# Patient Record
Sex: Male | Born: 1990
Health system: Southern US, Community
[De-identification: ages and names within clinical notes are randomized; demographics above are authoritative.]

## PROBLEM LIST (undated history)

## (undated) DIAGNOSIS — I10 Essential (primary) hypertension: Secondary | ICD-10-CM

## (undated) DIAGNOSIS — F410 Panic disorder [episodic paroxysmal anxiety] without agoraphobia: Secondary | ICD-10-CM

## (undated) DIAGNOSIS — J45909 Unspecified asthma, uncomplicated: Secondary | ICD-10-CM

## (undated) DIAGNOSIS — J42 Unspecified chronic bronchitis: Secondary | ICD-10-CM

## (undated) DIAGNOSIS — F419 Anxiety disorder, unspecified: Secondary | ICD-10-CM

## (undated) HISTORY — PX: TONSILLECTOMY: SUR1361

---
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
## 2011-02-12 LAB — LIPID PANEL
Cholesterol: 158 mg/dL (ref 0–169)
HDL: 36 mg/dL (ref >34)
Total CHOL/HDL Ratio: 4.4 RATIO

## 2011-02-12 LAB — GC/CHLAMYDIA PROBE AMP, URINE
Chlamydia, Swab/Urine, PCR: NEGATIVE
GC Probe Amp, Urine: NEGATIVE

## 2011-02-12 LAB — RPR: RPR Ser Ql: NONREACTIVE

## 2011-03-19 NOTE — H&P (Signed)
NAMEDENZIL, MCEACHRON NO.:  [id number]   MEDICAL RECORD NO.:  [id number]          PATIENT TYPE:  INP   LOCATION:  0200                          FACILITY:  BH   PHYSICIAN:  Lalla Brothers, MDDATE OF BIRTH:  Apr 24, 1991   DATE OF ADMISSION:  03/15/2009  DATE OF DISCHARGE:                       PSYCHIATRIC ADMISSION ASSESSMENT   IDENTIFICATION:  A 84-45/20-year-old male, eleventh grade student at  [employer], is admitted emergently involuntarily on an  [facility] petition for commitment upon transfer from [facility] Emergency Department for inpatient stabilization  and psychiatric treatment of psychotic depression with suicide risk and  homicide ideation also associated with dangerous disruptive behavior and  unsuccessful outpatient treatment including psychotherapy and 1-1/2  months of low-dose Prozac.  The patient has suicide plan to overdose,  hang, or shoot himself reporting that he has access to a gun.  He had  overdosed with Tylenol 5 months ago as a suicide attempt.  He feels  stress from his underachievement at school and sleeping in class and  suggests himself that school is the main reason to kill himself.  He is  sleeping only 4 hours nightly.   HISTORY OF PRESENT ILLNESS:  The patient is referred as having several  years of episodic suicide ideation with recurrent depression.  The  patient tends to store up strong negative emotions stating that he can  cope temporarily by listening to music or reading an article.  However,  he has recurrent saturation with negative thoughts and emotions  resulting in anger outbursts and acting upon negative thoughts such as  by the suicide attempt.  The patient implies that auditory  hallucinations have been going on for a long time but mother suggests  they have been present for 3 weeks.  He describes anxiety attacks  several times weekly.  He reports that voices are  telling him to kill  self or others.  The patient knows a maternal aunt has bipolar disorder  but does not describe any manic diathesis himself.  Rather the patient  appears depressed and anxious.  He seems to describe some generalized  anxiety in his episodic limited symptom attacks and seemingly pervasive  discomfort with attempting to but not meeting responsibilities.  The  patient does not maintain significant eye contact and does not openly  discuss his symptoms.  He does have a school counselor Ms. Tryola.  He  is seeing Gweneth Fritter for outpatient therapy at [facility]  at [facility].  The patient is on Prozac 10 mg every morning for the  last month and a half from Dr. Loma Sender, though apparently in  the past he has seen Dr. Sherrie Mustache 4 months ago for general medical care.  The patient finds no benefit from Prozac, though he finds no side  effects either.  However, despite therapy and medication, he is not  applying himself any further to responsibilities especially academics.  Mother seems to minimize the patient's symptoms while the patient seems  to overstate these at least in comparison to mother.  The patient  considers his voices currently  dangerous and himself unable to function.  He uses no alcohol or illicit drugs.  He does use zip or chewing tobacco  reportedly all day long.  Parents do not answer the phone calls for  collateral information.  The patient presents alone by law enforcement,  though mother apparently was at the emergency department and left after  assessment was concluded.   PAST MEDICAL HISTORY:  The patient is under the primary care of Dr.  Sherrie Mustache, last seen 4 months ago, though more recently has seen Dr.  Loma Sender for Prozac 10 mg daily.  In the emergency department,  random glucose was 115 while CO2 was slightly elevated at 32.  He had a  trace of bacteria in the urinalysis, though with amorphous phosphate  crystals present,  which may well obscure that microscopic assessment.  He otherwise denies seizures or syncope.  He has no heart murmur or  arrhythmia.  He has no organic central nervous system trauma.  He has no  purging.  He is allergic to PENICILLIN manifested by rash.  He has  sensitivity to CODEINE manifested by a tachycardia.   REVIEW OF SYSTEMS:  The patient offers paucity of spontaneous verbal  elaboration and questions.  He is avoidant in his social and learning  based posture.  He has no known exposure to communicable disease or  toxins.  He has no rash, jaundice, or purpura.  There is no chest pain,  palpitations, or presyncope.  There is no abdominal pain, nausea,  vomiting, or diarrhea.  There is no dysuria arthralgia.   IMMUNIZATIONS:  Up to date.   FAMILY HISTORY:  The patient resides with parents who are not available  to obtain additional family history at this time.  Maternal aunt has  bipolar disorder by history.  An uncle in [place] has been on Prozac but then  also developed hallucinations such that mother wonders if Prozac causes  hallucinations.  Mother takes Buspar for anxiety and did require  antidepressants in the past with benefit.   SOCIAL AND DEVELOPMENTAL HISTORY:  The patient is an eleventh grade  student at [employer].  The patient reports his school  is stressful.  He is currently making B's and F's according to the  patient with sleeping in class.  He has three F's currently and  considers himself failing.   He denies current legal charges.  He does not answer questions about  sexuality.   ASSETS:  The patient reads in order to cope, though in doing so he does  not learn.   MENTAL STATUS EXAM:  Height is 165 cm and weight is 54 kg.  Blood  pressure is 133/86 with heart rate of 68 sitting and 130/87 with heart  rate of 59 standing.  He is right-handed.  The patient is alert and  oriented with cranial nerves intact.  Muscle strength and tone are   normal.  There are no pathologic reflexes or soft neurologic findings.  There are no abnormal involuntary movements.  Gait and gaze are intact.  The patient is avoidant but can become more carefully engaging.  He  stores up conflicts with increased anxiety and despair.  He stores up  strong negative emotions and then becomes either angry or anxious in the  course of attempting to displace and minimize depression.  The patient  appears to respond to internal stimuli in his gaze and posture, though  he does not acknowledge such.  He has a florid  and marginal interest.  He does have boredom as well as generalized anxiety but does not  manifest definite panic at this time..  The patient has increased  anxiety and despair.  He has severe dysphoria and moderate anxiety of a  generalize nature.  He is restless with fragile but definite preserved  interest.  He has no mania or psychosis at this time.  He has no  misperceptions or false perceptions.  The patient has episodic suicidal  ideation.  He reports no affect or side effects from Prozac.  He reports  suicide ideation more than homicide ideation and planning to use a gun  foremost after which to hang or overdose himself.   IMPRESSION:  Axis I:  1.  Major depression, recurrent, severe, with  psychotic features.  2.  Generalized anxiety disorder.  3.  Oppositional  defiant disorder (provisional diagnosis).  4.  Parent-child problem.  5.  Academic problem.  6.  Other interpersonal problem.  7.  Family history  of bipolar disorder.  Axis II:  Diagnosis deferred.  Axis III:  1.  Allergy to PENICILLIN and sensitivity to CODEINE.  Axis IV:  Stressors:  Phase of life, severe, acute and chronic; school,  severe, acute and chronic; family, moderate, acute and chronic.  Axis V:  Global assessment of functioning on admission 33 with highest  in the last year 65.   PLAN:  The patient is admitted for inpatient adolescent psychiatric in   multidisciplinary and multimodal behavioral health treatment in a team-  based programmatic locked psychiatric unit.  We will increase Prozac to  10 mg in the morning and 20 mg at bedtime or consider change to Celexa.  We will consider addition of Risperdal according to metabolic screening  and objective assessment of symptoms.  Nicoderm 14 mg patch is made  available for withdrawal.  Cognitive behavioral therapy, anger  management, interpersonal therapy, desensitization, reintegration,  social and communication skill training, problem-solving and coping  skill training, family therapy, habit reversal, individuation-  separation, and identity consolidation therapies can be undertaken.  Estimated length of stay is 7 days  with target symptom for discharge being stabilization of suicide risk  and mood, stabilization of homicide risk and command auditory  hallucinations, stabilization of dangerous disruptive behavior and  associated academic and social fixation, and generalization of the  capacity for safe and effective participation in outpatient treatment.      Lalla Brothers, MD  Electronically Signed     GEJ/MEDQ  D:  03/15/2009  T:  03/16/2009  Job:  [id number]

## 2011-03-22 NOTE — Discharge Summary (Signed)
[name], WAGER NO.:  [id number]   MEDICAL RECORD NO.:  [id number]          PATIENT TYPE:  INP   LOCATION:  0200                          FACILITY:  BH   PHYSICIAN:  Lalla Brothers, MDDATE OF BIRTH:  06-30-1991   DATE OF ADMISSION:  03/15/2009  DATE OF DISCHARGE:  03/21/2009                               DISCHARGE SUMMARY   IDENTIFICATION:  This 8-60/20-year-old male, 11th grade student at  [employer] was admitted emergently involuntarily on an  [facility] petition for commitment upon transfer from [facility] emergency department for inpatient treatment of psychotic  depression with suicide and homicide risk.  The patient had suicide plan  to overdose, hang or shoot himself, reporting access to a gun.  He had  overdosed with Tylenol 5 months ago as suicide attempt and his symptoms  have not stabilized with a month and half of Prozac 10 mg daily for  ongoing psychotherapy.  For full details, please see the typed admission  assessment.   SYNOPSIS OF PRESENT ILLNESS:  The patient resides with both parents and  a 68 year old sister lives next door.  The patient had been going  downhill since paternal grandmother died in 2009/03/03.  Parents  apparently have marital conflicts with father having high expressed  emotion and mother having history of anxiety and depression.  The  patient has much more access to mother for support.  He is failing most  of his classes and hates school, doing best in [place] and electives.  The patient and father are discussing transfer to a GED program.  The  patient has not been able to achieve his driver's license.  Employment  has been discussed but the patient has not started yet.  The patient  would like to work in a sawmill.  He indicates bisexuality but does not  disclose such to parents.  Maternal great-aunt had bipolar disorder as  did maternal great-uncle who was schizoaffective.   Two paternal uncles  have mental retardation and paternal uncle has substance abuse with  alcohol.  Mother is concerned that an uncle in the [place] system has been on  Prozac and got worse, developing auditory hallucinations though mother  is not certain  Prozac was the cause.  Mother is taking BuSpar for her  anxiety, requiring antidepressant in the past, having benefit but not  having sustained use of the medicine.  The patient has used alcohol  weekly and also uses cannabis, mushrooms, morphine, OxyContin, and  Vicodin.  He uses dip tobacco more than cigarettes.   INITIAL MENTAL STATUS EXAMINATION:  He is right-handed with intact  neurological exam.  He stores up strong negative emotion, becoming  avoidant of conflicts and opportunity for problem-solving.  He reports  auditory hallucinations and appears to displace his gaze and posture  according to such.  He has severe dysphoria and moderate anxiety.  He  has no mania.  He did  plan to hang, overdose or shoot himself.   TOTAL LABORATORY FINDINGS:  In the emergency department, CBC was normal  with white count 7500,  hemoglobin 15.6, MCV of 88, MCH of 29.7 and  platelet count 273,000.  Comprehensive metabolic panel was normal with  sodium 140, potassium 3.9, random glucose 115, creatinine 1.1, calcium  9.8, albumin 4.8, AST 13 and ALT 30.  TSH was normal at 1.2 with  reference range 0.5-5.1.  Blood alcohol and urine drug screen were  negative.  Urinalysis was normal with specific gravity of 1.015 and pH 9  with a trace of bacteria and amorphous phosphate crystals.  [facility], 10-hour fasting lipid profile was normal except LDL  cholesterol borderline elevated at 111 with upper limit of normal 109.  HDL cholesterol was normal at 36, VLDL 9 and triglyceride 45, for total  cholesterol 158.  Hemoglobin A1c was normal at 5.1% with reference range  4.6-6.1.  RPR was nonreactive and urine probe for gonorrhea and  chlamydia by DNA  amplification were both negative.   HOSPITAL COURSE AND TREATMENT:  General medical exam by Jorje Guild, PA-C  noted allergy to CODEINE and PENICILLIN.  He had tonsillectomy at age  [age] years.  The patient has eyeglasses.  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  His BMI is 19.8 and he had  cerumen accumulation in both external ear canals.  He is bisexually  active.  The patient's hearing acuity is somewhat more prominent on the  left than the right, though he did have a cerumen accumulation.  The  patient was afebrile throughout hospital stay with maximum temperature  98.2.  His height was 165 cm and his weight was 54 kg in the emergency  department, 56 kg on discharge.   VITAL SIGNS:  Initial supine blood pressure was 115/67 with heart rate  of 59 and standing blood pressure 132/80 with heart rate of 59 before  medications were started.  At the time of discharge on discharge  medication dosing, supine blood pressure was 116/68 with heart rate of  70 and standing blood pressure 118/69 with heart rate of 114.   The patient was started on increased dose of fluoxetine at 10 mg in the  morning and 20 mg at bedtime, thereby an increase of 20 mg at bedtime.  Risperdal was started the following day at 0.25 mg in the morning and  0.5 mg at bedtime.  The Risperdal blood level may be reduced by Prozac  accelerating excretion.  Hopefully the Risperdal was titrated up to 2 mg  nightly dose and the Prozac to 20 mg nightly dose as a single dose of  each every evening.  The patient required a Nicoderm patch up to 21 mg  as needed for withdrawal.  He tolerated medications well with no  extrapyramidal, hypomanic, over-activation or suicide related side  effects.  The patient had resolution of auditory hallucinations by 2/3  of the way through the hospital stay.  He then became capable of working  in various psychotherapies much better and wanted discharge at that  time.   He completed treatment appropriately with final family therapy  session with both parents.  Noted parents were pleased with the  patient's improvement in both his depression and auditory  hallucinations.  The patient reported to parents that they were too  rigid in their ways for him to disclose to them his mental health  difficulties.  Father carefully offered to be available for any problems  the patient has.  They concluded to contact [employer] about a  diploma program  with the patient and parents concluding he could not  return to [facility], despite being stabilized in the program and on  medications.  The patient did not process his bisexuality with parents.  He required no seclusion or restraint during hospital stay.  They were  educated on warnings and side effects from medications.  Hopefully,  Risperdal can be time limited over several months and Prozac may need to  be longer term.   FINAL DIAGNOSES:  AXIS I:  1. Major depression, recurrent, severe with psychotic features.  2. Generalized anxiety disorder.  3. Other interpersonal problem.  4. Parent child problems.  5. Other specified family circumstances.  AXIS II: Diagnosis deferred.  AXIS III:  1. Allergy to penicillin.  2. Sensitivity to codeine.  3. Dip tobacco and cigarettes.  4. Borderline elevation of LDL cholesterol of 113 mg/dL.  5. Facial acne.  6. 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  7. Eyeglasses  8. Cerumen accumulation both external ear canals with more hearing      acuity in the left than right ear  AXIS IV: Stressors:  Phase of life, severe, acute and chronic; school,  severe, acute and chronic; family, moderate acute and chronic.  AXIS V: GAF on admission was 33 with highest in last year 65 and  discharge GAF was 52.   PLAN:  The patient was discharged to parents in improved condition, free  of suicidal ideation.  He follows a regular diet, has no restrictions on  physical activity except to  remain sober.  He requires no wound care or  pain management.  Crisis and safety plans are outlined if needed.   DISCHARGE MEDICATIONS:  1. He is discharged on fluoxetine 20 mg capsule every bedtime,      quantity #30 prescribed.  2. He is also discharged on risperidone 2 mg tablet every bedtime,      quantity #30 prescribed.   He will have medication management appointment with Dr. Vear Clock April 04, 2009 at 1500 at [phone].  He will see Burley Saver at [facility]  Mar 23, 2009 at 1700 at [phone] for therapy.      Lalla Brothers, MD  Electronically Signed     GEJ/MEDQ  D:  03/23/2009  T:  03/24/2009  Job:  [id number]   cc:   Weston Anna  Fax: [phone]

## 2013-08-21 ENCOUNTER — Emergency Department: Payer: Self-pay | Admitting: Emergency Medicine

## 2013-08-22 LAB — MONONUCLEOSIS SCREEN: Mono Test: NEGATIVE

## 2013-08-22 LAB — TSH: Thyroid Stimulating Horm: 3.13 u[IU]/mL

## 2013-08-25 ENCOUNTER — Emergency Department: Payer: Self-pay | Admitting: Emergency Medicine

## 2013-08-25 LAB — BETA STREP CULTURE(ARMC)

## 2013-12-15 ENCOUNTER — Emergency Department: Payer: Self-pay | Admitting: Internal Medicine

## 2013-12-15 LAB — URINALYSIS, COMPLETE
BACTERIA: NONE SEEN
BLOOD: NEGATIVE
Bilirubin,UR: NEGATIVE
GLUCOSE, UR: NEGATIVE mg/dL (ref 0–75)
LEUKOCYTE ESTERASE: NEGATIVE
NITRITE: NEGATIVE
PH: 7 (ref 4.5–8.0)
PROTEIN: NEGATIVE
Specific Gravity: 1.006 (ref 1.003–1.030)
Squamous Epithelial: NONE SEEN
WBC UR: NONE SEEN /HPF (ref 0–5)

## 2013-12-16 ENCOUNTER — Emergency Department: Payer: Self-pay | Admitting: Emergency Medicine

## 2013-12-17 ENCOUNTER — Emergency Department: Payer: Self-pay | Admitting: Emergency Medicine

## 2013-12-17 LAB — URINALYSIS, COMPLETE
BACTERIA: NONE SEEN
Bilirubin,UR: NEGATIVE
Blood: NEGATIVE
Glucose,UR: NEGATIVE mg/dL (ref 0–75)
KETONE: NEGATIVE
Leukocyte Esterase: NEGATIVE
Nitrite: NEGATIVE
PH: 6 (ref 4.5–8.0)
PROTEIN: NEGATIVE
RBC,UR: NONE SEEN /HPF (ref 0–5)
SPECIFIC GRAVITY: 1.006 (ref 1.003–1.030)
Squamous Epithelial: NONE SEEN
WBC UR: NONE SEEN /HPF (ref 0–5)

## 2013-12-19 ENCOUNTER — Emergency Department (HOSPITAL_COMMUNITY)
Admission: EM | Admit: 2013-12-19 | Discharge: 2013-12-19 | Disposition: A | Discharge status: Home / Self Care | Payer: BC Managed Care – PPO | Attending: Emergency Medicine | Admitting: Emergency Medicine

## 2013-12-19 ENCOUNTER — Encounter (HOSPITAL_COMMUNITY): Payer: Self-pay | Admitting: Emergency Medicine

## 2013-12-19 DIAGNOSIS — Z880 Allergy status to penicillin: Secondary | ICD-10-CM | POA: Insufficient documentation

## 2013-12-19 DIAGNOSIS — R509 Fever, unspecified: Secondary | ICD-10-CM | POA: Insufficient documentation

## 2013-12-19 DIAGNOSIS — N489 Disorder of penis, unspecified: Secondary | ICD-10-CM | POA: Insufficient documentation

## 2013-12-19 DIAGNOSIS — F17200 Nicotine dependence, unspecified, uncomplicated: Secondary | ICD-10-CM | POA: Insufficient documentation

## 2013-12-19 DIAGNOSIS — B009 Herpesviral infection, unspecified: Secondary | ICD-10-CM | POA: Insufficient documentation

## 2013-12-19 MED ORDER — VALACYCLOVIR HCL 1 G PO TABS: 1000.0000 mg | ORAL_TABLET | Freq: Two times a day (BID) | ORAL | Status: AC

## 2013-12-19 NOTE — Discharge Instructions (Signed)
Please read and follow all provided instructions.  Your diagnoses today include:  1. Herpes simplex     Tests performed today include:  Vital signs. See below for your results today.   Medications:   Valtrex - medication to suppress herpes rash  Home care instructions:  Read educational materials contained in this packet and follow any instructions provided.   Follow-up instructions: You should [facility] to be tested for GC/chlamydia, HIV, syphilis, and hepatitis -- all of which can be transmitted by sexual contact. We do not routinely screen for these in the Emergency Department.  STD Testing:  [facility] [place] [facility] [place] [place]  [address], [place], phone [phone] or [phone]    Monday - Friday, call for an appointment  [facility] [place] [facility]  [address], [place], phone [phone] or [phone]   Monday - Friday, call for an appointment   If you do not have a primary care doctor -- see below for referral information.   Return instructions:   Please return to the Emergency Department if you experience worsening symptoms.   Please return if you have any other emergent concerns.  Additional Information:  Your vital signs today were: BP 123/70   Pulse 91   Resp 18   Ht 5\' 6"  (1.676 m)   Wt 130 lb (58.968 kg)   BMI 20.99 kg/m2   SpO2 99% If your blood pressure (BP) was elevated above 135/85 this visit, please have this repeated by your doctor within one month. --------------

## 2013-12-19 NOTE — ED Notes (Signed)
Pt c/o pimples to genital area for 1-2 weeks. Pt reports pimples spreading noted yesterday. Pt reports yellowish clear drainage coming from pimples.

## 2013-12-19 NOTE — ED Provider Notes (Signed)
CSN: [id number]     Arrival date & time 12/19/13  1556 History  This chart was scribed for non-physician practitioner Rhea BleacherJosh Jazzlin Clements, PA working with Layla MawKristen N Ward, DO by Elveria Risingimelie Horne, ED Scribe. This patient was seen in room TR10C/TR10C and the patient's care was started at 5:05 PM.   Chief Complaint  Patient presents with  . Rash    pimples to genital area    Patient is a 23 y.o. male presenting with rash. The history is provided by the patient. No language interpreter was used.  Rash Location:  Ano-genital Quality: burning, draining and painful   Severity:  Moderate Progression:  Worsening Associated symptoms: no fever, no joint pain and no sore throat    HPI Comments: Lorenda Hatchetnthony Mandato is a 23 y.o. male who presents to the Emergency Department complaining of a burning pimple/sore on the right side of penis that he noticed yesterday. Patient reports that he has had these "pimples" for the last few weeks and they are spreading. Patient [place], [place]/13, [place] and was prescribed Bactrim and ibufropen. Thursday, 12/16/13, patient reports clear drainage from pimples and subjective fever. Friday, 12/17/13, patient reports [facility] complaining of subjective fever and chills. Patient was subsequently sent home.  Patient reports that he and his partner are sexually active, including oral sex, but not since his flare up.   History reviewed. No pertinent past medical history. History reviewed. No pertinent past surgical history. No family history on file. History  Substance Use Topics  . Smoking status: Current Every Day Smoker  . Smokeless tobacco: Not on file  . Alcohol Use: No    Review of Systems  Constitutional: Negative for fever.  HENT: Negative for sore throat.   Eyes: Negative for discharge.  Gastrointestinal: Negative for rectal pain.  Genitourinary: Negative for dysuria, frequency, discharge, genital sores, penile pain and testicular pain.   Musculoskeletal: Negative for arthralgias.  Skin: Positive for rash.       At the base of penis.  Hematological: Negative for adenopathy.      Allergies  Codeine and Penicillins  Home Medications   Current Outpatient Rx  Name  Route  Sig  Dispense  Refill  . valACYclovir (VALTREX) 1000 MG tablet   Oral   Take 1 tablet (1,000 mg total) by mouth 2 (two) times daily.   14 tablet   0    BP 123/70  Pulse 91  Resp 18  Ht 5\' 6"  (1.676 m)  Wt 130 lb (58.968 kg)  BMI 20.99 kg/m2  SpO2 99% Physical Exam  Nursing note and vitals reviewed. Constitutional: He is oriented to person, place, and time. He appears well-developed and well-nourished. No distress.  HENT:  Head: Normocephalic and atraumatic.  Eyes: EOM are normal.  Neck: Neck supple. No tracheal deviation present.  Cardiovascular: Normal rate.   Pulmonary/Chest: Effort normal. No respiratory distress.  Genitourinary:    Right testis shows no mass, no swelling and no tenderness. Left testis shows no mass, no swelling and no tenderness. Penile erythema and penile tenderness present. No discharge found.  Small area of grouped vesicles at the base of the penile shaft on an erythematous base. Appearance consistent with herpes simplex.   Musculoskeletal: Normal range of motion.  Neurological: He is alert and oriented to person, place, and time.  Skin: Skin is warm and dry.  Psychiatric: He has a normal mood and affect. His behavior is normal.    ED Course  Procedures (including  critical care time) DIAGNOSTIC STUDIES: Oxygen Saturation is 99% on room air, normal by my interpretation.    COORDINATION OF CARE: 5:18 PM- Pt advised of plan for treatment and pt agrees.    Labs Review Labs Reviewed - No data to display Imaging Review No results found.  EKG Interpretation   None      Pt seen and examined. Will treat for herpes simplex. Patient counseled on safe sexual practices. Urged f/u with [facility] for GC/chlamydia, HIV and syphilis testing.  Patient verbalizes understanding and agrees with plan.    He will finish course of Bactrim, previously prescribed.   MDM   Final diagnoses:  Herpes simplex   Patient with rash consistent with herpes simplex.     Renne Crigler, PA-C 12/19/13 2137

## 2013-12-20 NOTE — ED Provider Notes (Signed)
Medical screening examination/treatment/procedure(s) were performed by non-physician practitioner and as supervising physician I was immediately available for consultation/collaboration.  EKG Interpretation   None         Renee Beale N Lambert Jeanty, DO 12/20/13 0235 

## 2013-12-21 ENCOUNTER — Emergency Department: Payer: Self-pay | Admitting: Emergency Medicine

## 2013-12-21 LAB — URINALYSIS, COMPLETE
Bacteria: NONE SEEN
Bilirubin,UR: NEGATIVE
Blood: NEGATIVE
Ketone: NEGATIVE
LEUKOCYTE ESTERASE: NEGATIVE
Nitrite: NEGATIVE
PROTEIN: NEGATIVE
Ph: 6 (ref 4.5–8.0)
RBC, UR: NONE SEEN /HPF (ref 0–5)
SPECIFIC GRAVITY: 1.005 (ref 1.003–1.030)
Squamous Epithelial: NONE SEEN
WBC UR: NONE SEEN /HPF (ref 0–5)

## 2013-12-21 LAB — GC/CHLAMYDIA PROBE AMP

## 2014-02-03 ENCOUNTER — Emergency Department: Payer: Self-pay | Admitting: Emergency Medicine

## 2014-02-03 LAB — BASIC METABOLIC PANEL
ANION GAP: 7 (ref 7–16)
BUN: 12 mg/dL (ref 7–18)
CALCIUM: 9.2 mg/dL (ref 8.5–10.1)
Chloride: 104 mmol/L (ref 98–107)
Co2: 27 mmol/L (ref 21–32)
Creatinine: 1.3 mg/dL (ref 0.60–1.30)
EGFR (African American): >60
EGFR (Non-African Amer.): >60
GLUCOSE: 106 mg/dL — AB (ref 65–99)
Osmolality: 276 (ref 275–301)
POTASSIUM: 3.6 mmol/L (ref 3.5–5.1)
SODIUM: 138 mmol/L (ref 136–145)

## 2014-02-03 LAB — CBC WITH DIFFERENTIAL/PLATELET
BASOS ABS: 0.1 10*3/uL (ref 0.0–0.1)
Basophil %: 0.5 %
EOS ABS: 0.2 10*3/uL (ref 0.0–0.7)
Eosinophil %: 1.5 %
HCT: 45.9 % (ref 40.0–52.0)
HGB: 15.5 g/dL (ref 13.0–18.0)
Lymphocyte #: 2.6 10*3/uL (ref 1.0–3.6)
Lymphocyte %: 16.5 %
MCH: 29.7 pg (ref 26.0–34.0)
MCHC: 33.8 g/dL (ref 32.0–36.0)
MCV: 88 fL (ref 80–100)
MONO ABS: 1.3 x10 3/mm — AB (ref 0.2–1.0)
Monocyte %: 8.1 %
Neutrophil #: 11.6 10*3/uL — ABNORMAL HIGH (ref 1.4–6.5)
Neutrophil %: 73.4 %
PLATELETS: 260 10*3/uL (ref 150–440)
RBC: 5.22 10*6/uL (ref 4.40–5.90)
RDW: 13.8 % (ref 11.5–14.5)
WBC: 15.8 10*3/uL — AB (ref 3.8–10.6)

## 2014-02-04 LAB — TROPONIN I: Troponin-I: <0.02 ng/mL

## 2014-02-04 LAB — D-DIMER(ARMC): D-DIMER: 131 ng/mL

## 2014-03-28 ENCOUNTER — Emergency Department: Payer: Self-pay | Admitting: Emergency Medicine

## 2014-05-07 IMAGING — CR DG CHEST 2V
1 series · 2 of 2 positions shown · non-contrast
Comparison: None.

CLINICAL DATA: Right-sided rib pain and pain on inspiration

EXAM:
CHEST  2 VIEW

[Series 1: w chest pa · 0.14mm/px · 2 of 2 slices shown]
[im 1/2]
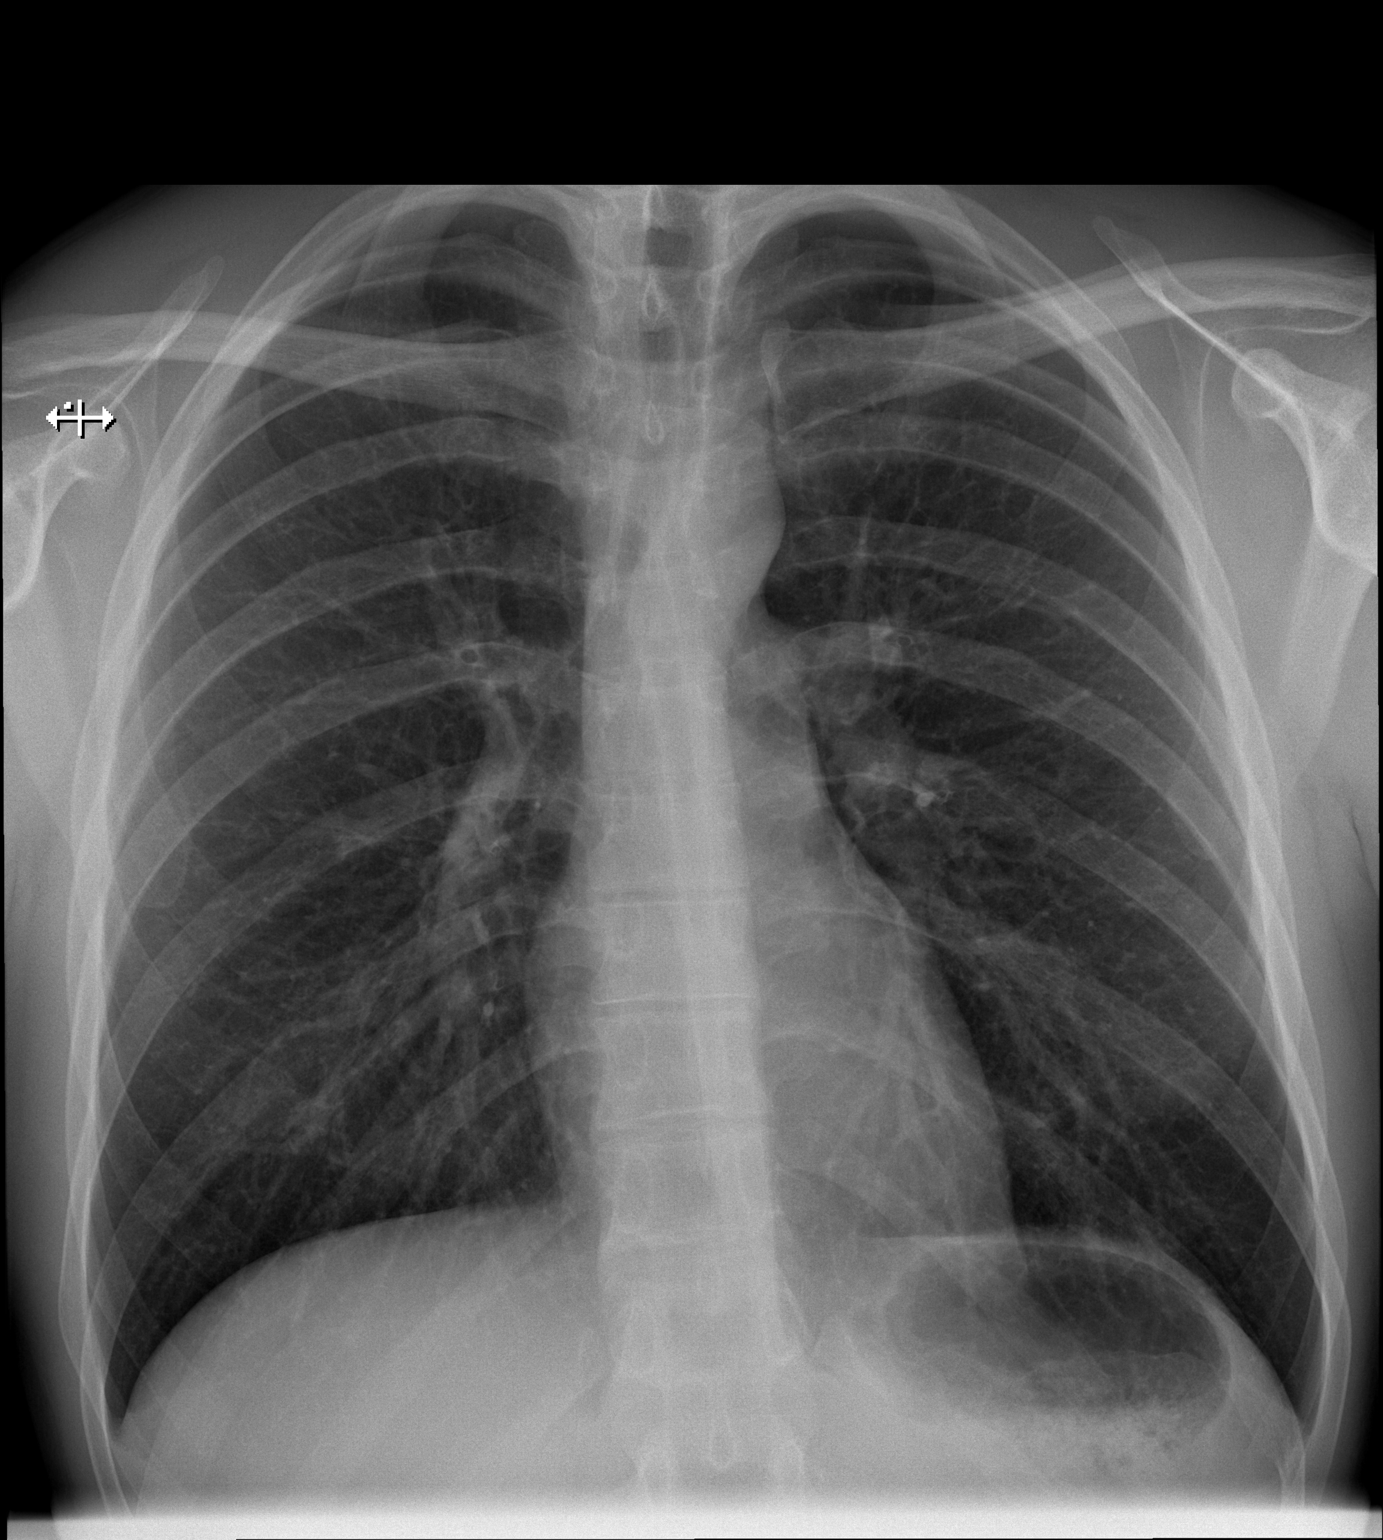
[im 2/2]
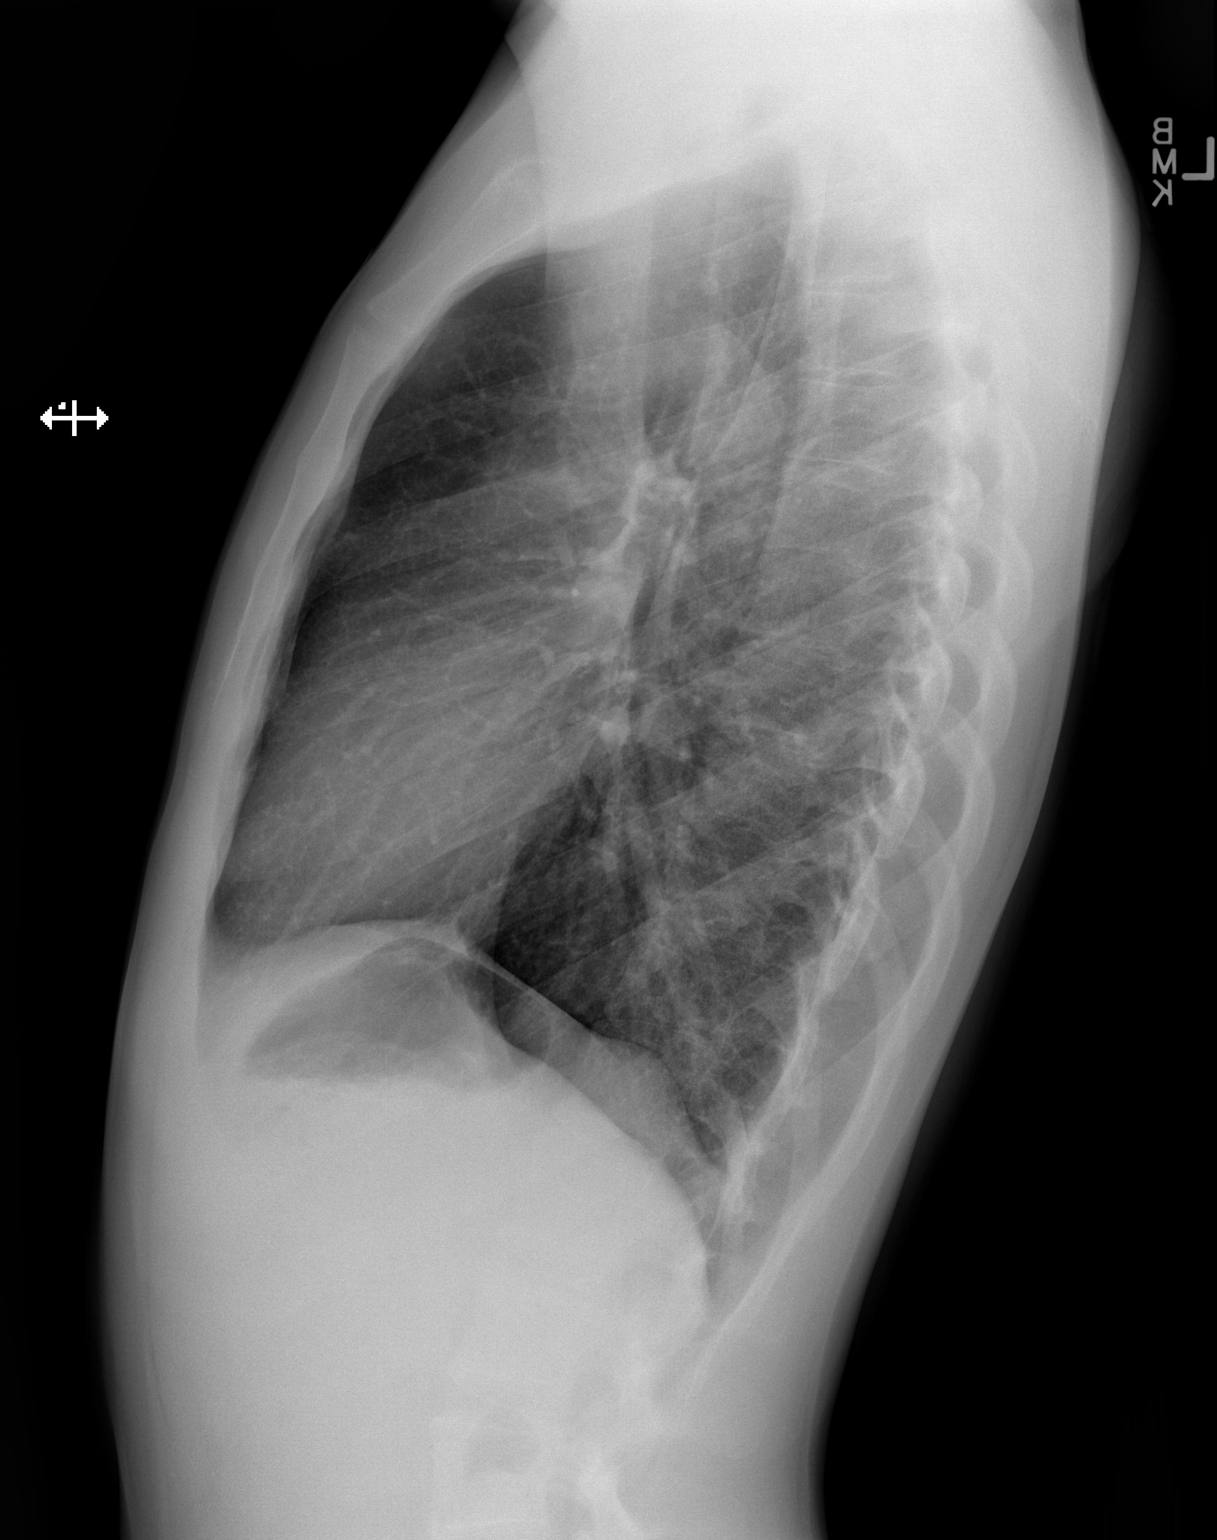

[2 of 2 positions shown; findings below may reference images not displayed]

FINDINGS: The lungs are clear and negative for focal airspace consolidation,
pulmonary edema or suspicious pulmonary nodule. No pleural effusion
or pneumothorax. Cardiac and mediastinal contours are within normal
limits. No acute fracture or lytic or blastic osseous lesions. The
visualized upper abdominal bowel gas pattern is unremarkable.
IMPRESSION: No active cardiopulmonary disease.

## 2014-06-14 ENCOUNTER — Emergency Department: Payer: Self-pay | Admitting: Emergency Medicine

## 2014-06-14 LAB — URINALYSIS, COMPLETE
Bacteria: NONE SEEN
Bilirubin,UR: NEGATIVE
Blood: NEGATIVE
GLUCOSE, UR: NEGATIVE mg/dL (ref 0–75)
Ketone: NEGATIVE
Nitrite: NEGATIVE
Ph: 7 (ref 4.5–8.0)
Protein: 30
RBC,UR: NONE SEEN /HPF (ref 0–5)
SPECIFIC GRAVITY: 1.017 (ref 1.003–1.030)
SQUAMOUS EPITHELIAL: NONE SEEN
WBC UR: 16 /HPF (ref 0–5)

## 2014-06-14 LAB — GC/CHLAMYDIA PROBE AMP

## 2015-03-12 ENCOUNTER — Emergency Department
Admission: EM | Admit: 2015-03-12 | Discharge: 2015-03-13 | Disposition: A | Discharge status: Home / Self Care | Payer: BLUE CROSS/BLUE SHIELD | Attending: Emergency Medicine | Admitting: Emergency Medicine

## 2015-03-12 ENCOUNTER — Other Ambulatory Visit: Payer: Self-pay

## 2015-03-12 ENCOUNTER — Encounter: Payer: Self-pay | Admitting: Emergency Medicine

## 2015-03-12 DIAGNOSIS — R109 Unspecified abdominal pain: Secondary | ICD-10-CM | POA: Diagnosis not present

## 2015-03-12 DIAGNOSIS — Z880 Allergy status to penicillin: Secondary | ICD-10-CM | POA: Diagnosis not present

## 2015-03-12 DIAGNOSIS — R079 Chest pain, unspecified: Secondary | ICD-10-CM | POA: Diagnosis not present

## 2015-03-12 DIAGNOSIS — R600 Localized edema: Secondary | ICD-10-CM | POA: Diagnosis not present

## 2015-03-12 DIAGNOSIS — R609 Edema, unspecified: Secondary | ICD-10-CM

## 2015-03-12 DIAGNOSIS — Z720 Tobacco use: Secondary | ICD-10-CM | POA: Diagnosis not present

## 2015-03-12 HISTORY — DX: Unspecified chronic bronchitis: J42

## 2015-03-12 LAB — URINALYSIS COMPLETE WITH MICROSCOPIC (ARMC ONLY)
BACTERIA UA: NONE SEEN
Bilirubin Urine: NEGATIVE
Glucose, UA: NEGATIVE mg/dL
Hgb urine dipstick: NEGATIVE
KETONES UR: NEGATIVE mg/dL
NITRITE: NEGATIVE
PH: 7 (ref 5.0–8.0)
PROTEIN: NEGATIVE mg/dL
SQUAMOUS EPITHELIAL / LPF: NONE SEEN
Specific Gravity, Urine: 1.005 (ref 1.005–1.030)

## 2015-03-12 LAB — COMPREHENSIVE METABOLIC PANEL
ALBUMIN: 5.2 g/dL — AB (ref 3.5–5.0)
ALT: 10 U/L — ABNORMAL LOW (ref 17–63)
ANION GAP: 11 (ref 5–15)
AST: 18 U/L (ref 15–41)
Alkaline Phosphatase: 63 U/L (ref 38–126)
BUN: 18 mg/dL (ref 6–20)
CALCIUM: 10.2 mg/dL (ref 8.9–10.3)
CO2: 28 mmol/L (ref 22–32)
Chloride: 102 mmol/L (ref 101–111)
Creatinine, Ser: 1 mg/dL (ref 0.61–1.24)
GFR calc non Af Amer: >60 mL/min (ref >60)
GLUCOSE: 91 mg/dL (ref 65–99)
Potassium: 3.6 mmol/L (ref 3.5–5.1)
SODIUM: 141 mmol/L (ref 135–145)
TOTAL PROTEIN: 7.9 g/dL (ref 6.5–8.1)
Total Bilirubin: 0.8 mg/dL (ref 0.3–1.2)

## 2015-03-12 LAB — CBC
HEMATOCRIT: 45.7 % (ref 40.0–52.0)
HEMOGLOBIN: 15.7 g/dL (ref 13.0–18.0)
MCH: 30.5 pg (ref 26.0–34.0)
MCHC: 34.3 g/dL (ref 32.0–36.0)
MCV: 89 fL (ref 80.0–100.0)
Platelets: 280 10*3/uL (ref 150–440)
RBC: 5.13 MIL/uL (ref 4.40–5.90)
RDW: 14.4 % (ref 11.5–14.5)
WBC: 8.8 10*3/uL (ref 3.8–10.6)

## 2015-03-12 LAB — TROPONIN I: Troponin I: <0.03 ng/mL (ref <0.031)

## 2015-03-12 NOTE — Discharge Instructions (Signed)
Please seek medical attention for any high fevers, chest pain, shortness of breath, change in behavior, persistent vomiting, bloody stool or any other new or concerning symptoms. How Much is Too Much Alcohol? Drinking too much alcohol can cause injury, accidents, and health problems. These types of problems can include:   Car crashes.  Falls.  Family fighting (domestic violence).  Drowning.  Fights.  Injuries.  Burns.  Damage to certain organs.  Having a baby with birth defects. ONE DRINK CAN BE TOO MUCH WHEN YOU ARE:  Working.  Pregnant or breastfeeding.  Taking medicines. Ask your doctor.  Driving or planning to drive. WHAT IS A STANDARD DRINK?   1 regular beer (12 ounces or 360 milliliters).  1 glass of wine (5 ounces or 150 milliliters).  1 shot of liquor (1.5 ounces or 45 milliliters). BLOOD ALCOHOL LEVELS   .00 A person is sober.  Marland Kitchen.03 A person has no trouble keeping balance, talking, or seeing right, but a "buzz" may be felt.  Marland Kitchen.05 A person feels "buzzed" and relaxed.  Marland Kitchen.08 or .10  A person is drunk. He or she has trouble talking, seeing right, and keeping his or her balance.  .15 A person loses body control and may pass out (blackout).  .20 A person has trouble walking (staggering) and throws up (vomits).  .30 A person will pass out (unconscious).  .40+ A person will be in a coma. Death is possible. If you or someone you know has a drinking problem, get help from a doctor.  Document Released: 08/17/2009 Document Revised: 01/13/2012 Document Reviewed: 08/17/2009 [facility] Patient Information 2015 [employer] [place]. This information is not intended to replace advice given to you by your health care provider. Make sure you discuss any questions you have with your health care provider.

## 2015-03-12 NOTE — ED Notes (Signed)
MD at bedside. 

## 2015-03-12 NOTE — ED Provider Notes (Signed)
[facility] Emergency Department Provider Note    ____________________________________________  Time seen: 2030  I have reviewed the triage vital signs and the nursing notes.   HISTORY  Chief Complaint Joint Swelling; Chest Pain; and Abdominal Pain   History limited by: Not Limited   HPI David Salinas is a 24 y.o. male who presents to the emergency department today with [facility] medical complaints including chest pain, hand swelling, abdominal pain, sweating and high blood pressure. Patient states the symptoms have been going on for the past 3-4 days. These symptoms have been intermittent. He states that at times they will be worse in the morning in the hand swollen will be accompanied by pain.  Patient does endorse drinking a large amount of alcohol over the past week. Denies any fevers.     Past Medical History  Diagnosis Date  . Chronic bronchitis     There are no active problems to display for this patient.   Past Surgical History  Procedure Laterality Date  . Tonsillectomy      No current outpatient prescriptions on file.  Allergies Codeine and Penicillins  History reviewed. No pertinent family history.  Social History History  Substance Use Topics  . Smoking status: Current Every Day Smoker  . Smokeless tobacco: Not on file  . Alcohol Use: No    Review of Systems  Constitutional: Negative for fever. Cardiovascular: Positive for chest pain. Respiratory: Negative for shortness of breath. Gastrointestinal: Positive for abdominal pain Genitourinary: Negative for dysuria. Musculoskeletal: Positive for hand swelling Skin: Negative for rash. Neurological: Negative for headaches, focal weakness or numbness.   10-point ROS otherwise negative.  ____________________________________________   PHYSICAL EXAM:  VITAL SIGNS: ED Triage Vitals  Enc Vitals Group     BP 03/12/15 2029 143/84 mmHg     Pulse Rate 03/12/15 2029 73      Resp 03/12/15 2029 20     Temp 03/12/15 2029 98.2 F (36.8 C)     Temp Source 03/12/15 2029 Oral     SpO2 03/12/15 2029 100 %     Weight 03/12/15 2029 117 lb (53.071 kg)     Height 03/12/15 2029 '5\' 6"'  (1.676 m)     Head Cir --      Peak Flow --      Pain Score 03/12/15 2030 6   Constitutional: Alert and oriented. Well appearing and in no distress. Eyes: Conjunctivae are normal. PERRL. Normal extraocular movements. ENT   Head: Normocephalic and atraumatic.   Nose: No congestion/rhinnorhea.   Mouth/Throat: Mucous membranes are moist.   Neck: No stridor. Hematological/Lymphatic/Immunilogical: No cervical lymphadenopathy. Cardiovascular: Normal rate, regular rhythm.  No murmurs, rubs, or gallops. Respiratory: Normal respiratory effort without tachypnea nor retractions. Breath sounds are clear and equal bilaterally. No wheezes/rales/rhonchi. Gastrointestinal: Soft and nontender. No distention.  Genitourinary: Deferred Musculoskeletal: Normal range of motion in all extremities. No joint effusions.  No lower extremity tenderness nor edema. No edema noted of the hands. Neurologic:  Normal speech and language. No gross focal neurologic deficits are appreciated. Speech is normal.  Skin:  Skin is warm, dry and intact. No rash noted. Psychiatric: Mood and affect are normal. Speech and behavior are normal. Patient exhibits appropriate insight and judgment.  ____________________________________________    LABS (pertinent positives/negatives)  ED labs pending at time of sign out  ____________________________________________   EKG  EKG Time: 2039 Rate: 69 Rhythm: Normal sinus rhythm Axis: Normal Intervals: QTc 428 QRS: Incomplete right bundle branch block ST  changes: No ST elevation or depression    ____________________________________________    RADIOLOGY  None  ____________________________________________   PROCEDURES  Procedure(s) performed:  None  Critical Care performed: No  ____________________________________________   INITIAL IMPRESSION / ASSESSMENT AND PLAN / ED COURSE  Pertinent labs & imaging results that were available during my care of the patient were reviewed by me and considered in my medical decision making (see chart for details).  Patient to the emergency department with multiple complaints including, chest pain, hand swollen. Physical exam is reassuring. Unclear etiology of myriad symptoms. We will however get basic blood work and urine.  Blood work and urine without concerning results and to spit discharge home.  ____________________________________________   FINAL CLINICAL IMPRESSION(S) / ED DIAGNOSES  Final diagnoses:  Chest pain, unspecified chest pain type  Peripheral edema     Nance Pear, MD 03/13/15 1549

## 2015-03-12 NOTE — ED Notes (Signed)
Pt presents to ER alert and in NAD. Pt has multiple medical complaints stating HTN, abd pain, chest tightness, diaphoresis, hand swelling. resp even and unlabored.

## 2015-06-26 ENCOUNTER — Encounter: Payer: Self-pay | Admitting: Emergency Medicine

## 2015-06-26 ENCOUNTER — Emergency Department
Admission: EM | Admit: 2015-06-26 | Discharge: 2015-06-26 | Disposition: A | Discharge status: Home / Self Care | Payer: BLUE CROSS/BLUE SHIELD | Attending: Emergency Medicine | Admitting: Emergency Medicine

## 2015-06-26 DIAGNOSIS — K047 Periapical abscess without sinus: Secondary | ICD-10-CM | POA: Diagnosis not present

## 2015-06-26 DIAGNOSIS — Z720 Tobacco use: Secondary | ICD-10-CM | POA: Insufficient documentation

## 2015-06-26 DIAGNOSIS — K0381 Cracked tooth: Secondary | ICD-10-CM | POA: Diagnosis not present

## 2015-06-26 DIAGNOSIS — Z880 Allergy status to penicillin: Secondary | ICD-10-CM | POA: Insufficient documentation

## 2015-06-26 DIAGNOSIS — R220 Localized swelling, mass and lump, head: Secondary | ICD-10-CM | POA: Diagnosis present

## 2015-06-26 MED ORDER — SULFAMETHOXAZOLE-TRIMETHOPRIM 800-160 MG PO TABS: 1.0000 | ORAL_TABLET | Freq: Two times a day (BID) | ORAL | Status: DC

## 2015-06-26 MED ORDER — NAPROXEN 500 MG PO TABS: 500.0000 mg | ORAL_TABLET | Freq: Two times a day (BID) | ORAL | Status: AC

## 2015-06-26 NOTE — ED Notes (Signed)
Pt via POV with wife c/o a lump under the left jaw for the last month, pt states it started as a pepple and got bigger recently.  Pt denies difficulty swallowing, face and neckline appear symmetrical.  Pt admits poor dental hygiene.

## 2015-06-26 NOTE — Discharge Instructions (Signed)

## 2015-06-26 NOTE — ED Provider Notes (Signed)
[facility] Emergency Department Provider Note ____________________________________________  Time seen: Approximately 7:27 AM  I have reviewed the triage vital signs and the nursing notes.   HISTORY  Chief Complaint Mass   HPI MACAULEY MOSSBERG is a 24 y.o. male who presents to the emergency department for evaluation for a lump under the left jaw. He states that it started out as a small area and is getting bigger. He states that the area is becoming increasingly painful.   Past Medical History  Diagnosis Date  . Chronic bronchitis     There are no active problems to display for this patient.   Past Surgical History  Procedure Laterality Date  . Tonsillectomy      Current Outpatient Rx  Name  Route  Sig  Dispense  Refill  . naproxen (NAPROSYN) 500 MG tablet   Oral   Take 1 tablet (500 mg total) by mouth 2 (two) times daily with a meal.   60 tablet   2   . sulfamethoxazole-trimethoprim (BACTRIM DS,SEPTRA DS) 800-160 MG per tablet   Oral   Take 1 tablet by mouth 2 (two) times daily.   20 tablet   0     Allergies Codeine and Penicillins  History reviewed. No pertinent family history.  Social History Social History  Substance Use Topics  . Smoking status: Current Every Day Smoker -- 0.50 packs/day    Types: Cigarettes  . Smokeless tobacco: None  . Alcohol Use: Yes     Comment: 3-4 drinks per day    Review of Systems Constitutional: No fever/chills Eyes: No visual changes. ENT: No sore throat. Cardiovascular: Denies chest pain. Respiratory: Denies shortness of breath. Gastrointestinal: No abdominal pain.  No nausea, no vomiting.  Genitourinary: Negative for dysuria. Musculoskeletal: Negative for back pain. Skin: Negative for rash. Neurological: Negative for headaches, focal weakness or numbness. 10-point ROS otherwise negative.  ____________________________________________   PHYSICAL EXAM:  VITAL SIGNS: ED Triage Vitals   Enc Vitals Group     BP 06/26/15 0440 125/70 mmHg     Pulse Rate 06/26/15 0440 78     Resp 06/26/15 0440 20     Temp 06/26/15 0440 98.5 F (36.9 C)     Temp Source 06/26/15 0440 Oral     SpO2 06/26/15 0440 99 %     Weight 06/26/15 0440 130 lb (58.968 kg)     Height 06/26/15 0440 5\' 6"  (1.676 m)     Head Cir --      Peak Flow --      Pain Score 06/26/15 0441 8     Pain Loc --      Pain Edu? --      Excl. in GC? --     Constitutional: Alert and oriented. Well appearing and in no acute distress. Eyes: Conjunctivae are normal. PERRL. EOMI. Head: Atraumatic. Nose: No congestion/rhinnorhea. Mouth/Throat: Mucous membranes are moist.  Oropharynx non-erythematous. Periodontal Exam    Neck: No stridor.  Hematological/Lymphatic/Immunilogical: No cervical lymphadenopathy. Cardiovascular:   Good peripheral circulation. Respiratory: Normal respiratory effort.  No retractions. Musculoskeletal: No lower extremity tenderness nor edema.  No joint effusions. Neurologic:  Normal speech and language. No gross focal neurologic deficits are appreciated. Speech is normal. No gait instability. Skin:  Skin is warm, dry and intact. No rash noted. Psychiatric: Mood and affect are normal. Speech and behavior are normal.  ____________________________________________   LABS (all labs ordered are listed, but only abnormal results are displayed)  Labs Reviewed -  No data to display ____________________________________________   RADIOLOGY  Not indicated ____________________________________________   PROCEDURES  Procedure(s) performed: None  Critical Care performed: No  ____________________________________________   INITIAL IMPRESSION / ASSESSMENT AND PLAN / ED COURSE  Pertinent labs & imaging results that were available during my care of the patient were reviewed by me and considered in my medical decision making (see chart for details).  Patient was advised to see the dentist within  14 days. Also advised to take the antibiotic until finished. Instructed to return to the ER for symptoms that change or worsen if you are unable to schedule an appointment. ____________________________________________   FINAL CLINICAL IMPRESSION(S) / ED DIAGNOSES  Final diagnoses:  Dental abscess      Chinita Pester, FNP 06/26/15 1329  Jennye Moccasin, MD 06/26/15 1431

## 2016-01-14 ENCOUNTER — Emergency Department: Payer: 59

## 2016-01-14 ENCOUNTER — Emergency Department
Admission: EM | Admit: 2016-01-14 | Discharge: 2016-01-15 | Disposition: A | Discharge status: Home / Self Care | Payer: 59 | Attending: Emergency Medicine | Admitting: Emergency Medicine

## 2016-01-14 ENCOUNTER — Encounter: Payer: Self-pay | Admitting: *Deleted

## 2016-01-14 DIAGNOSIS — Z79899 Other long term (current) drug therapy: Secondary | ICD-10-CM | POA: Diagnosis not present

## 2016-01-14 DIAGNOSIS — F17210 Nicotine dependence, cigarettes, uncomplicated: Secondary | ICD-10-CM | POA: Insufficient documentation

## 2016-01-14 DIAGNOSIS — R079 Chest pain, unspecified: Secondary | ICD-10-CM | POA: Insufficient documentation

## 2016-01-14 DIAGNOSIS — Z791 Long term (current) use of non-steroidal anti-inflammatories (NSAID): Secondary | ICD-10-CM | POA: Diagnosis not present

## 2016-01-14 DIAGNOSIS — R002 Palpitations: Secondary | ICD-10-CM | POA: Insufficient documentation

## 2016-01-14 DIAGNOSIS — Z880 Allergy status to penicillin: Secondary | ICD-10-CM | POA: Insufficient documentation

## 2016-01-14 LAB — CBC
HCT: 47 % (ref 40.0–52.0)
HEMOGLOBIN: 16.1 g/dL (ref 13.0–18.0)
MCH: 30.6 pg (ref 26.0–34.0)
MCHC: 34.2 g/dL (ref 32.0–36.0)
MCV: 89.6 fL (ref 80.0–100.0)
PLATELETS: 304 10*3/uL (ref 150–440)
RBC: 5.25 MIL/uL (ref 4.40–5.90)
RDW: 13.5 % (ref 11.5–14.5)
WBC: 13.7 10*3/uL — ABNORMAL HIGH (ref 3.8–10.6)

## 2016-01-14 NOTE — ED Notes (Addendum)
Pt c/o sensation of heart racing x 1.5 hrs ago. Pt states at that time he had pain radiating down L arm. Pt has not had sensation of heart racing x 20 minutes. Pt states he had L back pain, also. Pt denies other associated cardiac sxs, including n/v. Pt consumes a significant amount of coffee daily.

## 2016-01-15 LAB — BASIC METABOLIC PANEL
ANION GAP: 8 (ref 5–15)
BUN: 16 mg/dL (ref 6–20)
CHLORIDE: 100 mmol/L — AB (ref 101–111)
CO2: 28 mmol/L (ref 22–32)
Calcium: 9.7 mg/dL (ref 8.9–10.3)
Creatinine, Ser: 1.24 mg/dL (ref 0.61–1.24)
GFR calc non Af Amer: >60 mL/min (ref >60)
Glucose, Bld: 112 mg/dL — ABNORMAL HIGH (ref 65–99)
POTASSIUM: 4.2 mmol/L (ref 3.5–5.1)
SODIUM: 136 mmol/L (ref 135–145)

## 2016-01-15 LAB — TROPONIN I

## 2016-01-15 LAB — MAGNESIUM: MAGNESIUM: 2 mg/dL (ref 1.7–2.4)

## 2016-01-15 NOTE — ED Provider Notes (Signed)
[facility] Emergency Department Provider Note  ____________________________________________  Time seen: Approximately 0036 AM  I have reviewed the triage vital signs and the nursing notes.   HISTORY  Chief Complaint Palpitations    HPI David Salinas is a 25 y.o. male who comes into the hospital today with racing heartbeat. He reports that he keeps coming and going. His heart beat would slow down that it would speed up and was slow down the speed up. The patient reports that he was laying down and trying to go to sleep when the symptoms occur. He reports that he had quite a bit of coffee today which is more that he normally does. He also reports that he started taking hydroxyzine for anxiety. The patient reports she did have some sharp pain right over where his heart would be. He reports that when his heart rate goes above 85 he feels the symptoms. The patient denies any dizziness lightheadedness fever or nausea or vomiting with these symptoms. He was concerned so he decided to come and get checked out. He reports though that he is not having any pain at this time but he did feel jittery when he initially arrived.The patient is here for evaluation. Nothing seems to bring on the fast heart rate it just comes and goes on its own.   Past Medical History  Diagnosis Date  . Chronic bronchitis (HCC)     There are no active problems to display for this patient.   Past Surgical History  Procedure Laterality Date  . Tonsillectomy      Current Outpatient Rx  Name  Route  Sig  Dispense  Refill  . hydrOXYzine (ATARAX/VISTARIL) 50 MG tablet   Oral   Take 50 mg by mouth 3 (three) times daily as needed.         Marland Kitchen. PARoxetine (PAXIL) 10 MG tablet   Oral   Take 10 mg by mouth daily.         . naproxen (NAPROSYN) 500 MG tablet   Oral   Take 1 tablet (500 mg total) by mouth 2 (two) times daily with a meal.   60 tablet   2   . sulfamethoxazole-trimethoprim  (BACTRIM DS,SEPTRA DS) 800-160 MG per tablet   Oral   Take 1 tablet by mouth 2 (two) times daily.   20 tablet   0     Allergies Codeine and Penicillins  History reviewed. No pertinent family history.  Social History Social History  Substance Use Topics  . Smoking status: Current Every Day Smoker -- 0.50 packs/day    Types: Cigarettes  . Smokeless tobacco: None  . Alcohol Use: Yes     Comment: 3-4 drinks per day    Review of Systems Constitutional: No fever/chills Eyes: No visual changes. ENT: No sore throat. Cardiovascular: Palpitations and chest pain. Respiratory: Denies shortness of breath. Gastrointestinal: No abdominal pain.  No nausea, no vomiting.  No diarrhea.  No constipation. Genitourinary: Negative for dysuria. Musculoskeletal: Negative for back pain. Skin: Negative for rash. Neurological: Negative for headaches, focal weakness or numbness.  10-point ROS otherwise negative.  ____________________________________________   PHYSICAL EXAM:  VITAL SIGNS: ED Triage Vitals  Enc Vitals Group     BP 01/14/16 2327 144/80 mmHg     Pulse Rate 01/14/16 2327 97     Resp 01/14/16 2327 18     Temp 01/14/16 2327 98.2 F (36.8 C)     Temp Source 01/14/16 2327 Oral  SpO2 01/14/16 2327 99 %     Weight 01/14/16 2327 130 lb (58.968 kg)     Height 01/14/16 2327  (1.676 m)     Head Cir --      Peak Flow --      Pain Score 01/14/16 2321 0     Pain Loc --      Pain Edu? --      Excl. in GC? --     Constitutional: Alert and oriented. Well appearing and in mild distress. Eyes: Conjunctivae are normal. PERRL. EOMI. Head: Atraumatic. Nose: No congestion/rhinnorhea. Mouth/Throat: Mucous membranes are moist.  Oropharynx non-erythematous. Cardiovascular: Normal rate, regular rhythm. Grossly normal heart sounds.  Good peripheral circulation. Respiratory: Normal respiratory effort.  No retractions. Lungs CTAB. Gastrointestinal: Soft and nontender. No distention.  Positive bowel sounds Musculoskeletal: No lower extremity tenderness nor edema.   Neurologic:  Normal speech and language.  Skin:  Skin is warm, dry and intact.  Psychiatric: Mood and affect are normal.   ____________________________________________   LABS (all labs ordered are listed, but only abnormal results are displayed)  Labs Reviewed  BASIC METABOLIC PANEL - Abnormal; Notable for the following:    Chloride 100 (*)    Glucose, Bld 112 (*)    All other components within normal limits  CBC - Abnormal; Notable for the following:    WBC 13.7 (*)    All other components within normal limits  TROPONIN I  MAGNESIUM  TROPONIN I   ____________________________________________  EKG  ED ECG REPORT I, Rebecka Apley, the attending physician, personally viewed and interpreted this ECG.   Date: 01/14/2016  EKG Time: 2317  Rate: 87  Rhythm: normal sinus rhythm  Axis: normal  Intervals:none  ST&T Change: none  ____________________________________________  RADIOLOGY  Chest x-ray: No acute cardiopulmonary process seen ____________________________________________   PROCEDURES  Procedure(s) performed: None  Critical Care performed: No  ____________________________________________   INITIAL IMPRESSION / ASSESSMENT AND PLAN / ED COURSE  Pertinent labs & imaging results that were available during my care of the patient were reviewed by me and considered in my medical decision making (see chart for details).  This is a 25 year old male who comes into the hospital today with some palpitations. The patient reports that he also had some chest pain with the symptoms. The patient's blood work at this time is unremarkable including a magnesium. I will repeat her troponin to ensure that the patient is not having any acute cardiac process and if it is negative he'll be discharged home. At this time the patient is having no palpitations nor is he having any chest pain.  The  patient's repeat troponin is negative. He will be discharged to home to follow-up with his primary care physician. He has had no sensation of palpitations here in the ED. I will recommend decreasing or stopping his caffeine intake. ____________________________________________   FINAL CLINICAL IMPRESSION(S) / ED DIAGNOSES  Final diagnoses:  Palpitations  Chest pain, unspecified chest pain type      Rebecka Apley, MD 01/15/16 [phone]

## 2016-01-15 NOTE — Discharge Instructions (Signed)
Please decrease or stop your caffeine intake.   Palpitations A palpitation is the feeling that your heartbeat is irregular or is faster than normal. It may feel like your heart is fluttering or skipping a beat. Palpitations are usually not a serious problem. However, in some cases, you may need further medical evaluation. CAUSES  Palpitations can be caused by:  Smoking.  Caffeine or other stimulants, such as diet pills or energy drinks.  Alcohol.  Stress and anxiety.  Strenuous physical activity.  Fatigue.  Certain medicines.  Heart disease, especially if you have a history of irregular heart rhythms (arrhythmias), such as atrial fibrillation, atrial flutter, or supraventricular tachycardia.  An improperly working pacemaker or defibrillator. DIAGNOSIS  To find the cause of your palpitations, your health care provider will take your medical history and perform a physical exam. Your health care provider may also have you take a test called an ambulatory electrocardiogram (ECG). An ECG records your heartbeat patterns over a 24-hour period. You may also have other tests, such as:  Transthoracic echocardiogram (TTE). During echocardiography, sound waves are used to evaluate how blood flows through your heart.  Transesophageal echocardiogram (TEE).  Cardiac monitoring. This allows your health care provider to monitor your heart rate and rhythm in real time.  Holter monitor. This is a portable device that records your heartbeat and can help diagnose heart arrhythmias. It allows your health care provider to track your heart activity for several days, if needed.  Stress tests by exercise or by giving medicine that makes the heart beat faster. TREATMENT  Treatment of palpitations depends on the cause of your symptoms and can vary greatly. Most cases of palpitations do not require any treatment other than time, relaxation, and monitoring your symptoms. Other causes, such as atrial  fibrillation, atrial flutter, or supraventricular tachycardia, usually require further treatment. HOME CARE INSTRUCTIONS   Avoid:  Caffeinated coffee, tea, soft drinks, diet pills, and energy drinks.  Chocolate.  Alcohol.  Stop smoking if you smoke.  Reduce your stress and anxiety. Things that can help you relax include:  A method of controlling things in your body, such as your heartbeats, with your mind (biofeedback).  Yoga.  Meditation.  Physical activity such as swimming, jogging, or walking.  Get plenty of rest and sleep. SEEK MEDICAL CARE IF:   You continue to have a fast or irregular heartbeat beyond 24 hours.  Your palpitations occur more often. SEEK IMMEDIATE MEDICAL CARE IF:  You have chest pain or shortness of breath.  You have a severe headache.  You feel dizzy or you faint. MAKE SURE YOU:  Understand these instructions.  Will watch your condition.  Will get help right away if you are not doing well or get worse.   This information is not intended to replace advice given to you by your health care provider. Make sure you discuss any questions you have with your health care provider.   Document Released: 10/18/2000 Document Revised: 10/26/2013 Document Reviewed: 12/20/2011 [employer] Interactive Patient Education [employer]

## 2016-02-17 ENCOUNTER — Emergency Department: Payer: 59

## 2016-02-17 ENCOUNTER — Encounter: Payer: Self-pay | Admitting: Emergency Medicine

## 2016-02-17 ENCOUNTER — Emergency Department
Admission: EM | Admit: 2016-02-17 | Discharge: 2016-02-17 | Disposition: A | Discharge status: Home / Self Care | Payer: 59 | Attending: Emergency Medicine | Admitting: Emergency Medicine

## 2016-02-17 DIAGNOSIS — F17210 Nicotine dependence, cigarettes, uncomplicated: Secondary | ICD-10-CM | POA: Insufficient documentation

## 2016-02-17 DIAGNOSIS — J209 Acute bronchitis, unspecified: Secondary | ICD-10-CM | POA: Diagnosis not present

## 2016-02-17 DIAGNOSIS — J45909 Unspecified asthma, uncomplicated: Secondary | ICD-10-CM | POA: Insufficient documentation

## 2016-02-17 DIAGNOSIS — Z7951 Long term (current) use of inhaled steroids: Secondary | ICD-10-CM | POA: Diagnosis not present

## 2016-02-17 DIAGNOSIS — J069 Acute upper respiratory infection, unspecified: Secondary | ICD-10-CM

## 2016-02-17 DIAGNOSIS — R05 Cough: Secondary | ICD-10-CM | POA: Diagnosis present

## 2016-02-17 HISTORY — DX: Unspecified asthma, uncomplicated: J45.909

## 2016-02-17 MED ORDER — FLUTICASONE PROPIONATE 50 MCG/ACT NA SUSP: 2.0000 | Freq: Every day | NASAL | Status: DC

## 2016-02-17 MED ORDER — IPRATROPIUM-ALBUTEROL 0.5-2.5 (3) MG/3ML IN SOLN
RESPIRATORY_TRACT | Status: AC
  Administered 2016-02-17: 3 mL via RESPIRATORY_TRACT
  Filled 2016-02-17: qty 3

## 2016-02-17 MED ORDER — IPRATROPIUM-ALBUTEROL 0.5-2.5 (3) MG/3ML IN SOLN
3.0000 mL | Freq: Once | RESPIRATORY_TRACT | Status: AC
  Administered 2016-02-17: 3 mL via RESPIRATORY_TRACT

## 2016-02-17 MED ORDER — ALBUTEROL SULFATE HFA 108 (90 BASE) MCG/ACT IN AERS: 2.0000 | INHALATION_SPRAY | Freq: Four times a day (QID) | RESPIRATORY_TRACT | Status: DC | PRN

## 2016-02-17 MED ORDER — BENZONATATE 100 MG PO CAPS: 100.0000 mg | ORAL_CAPSULE | Freq: Three times a day (TID) | ORAL | Status: DC | PRN

## 2016-02-17 NOTE — ED Provider Notes (Signed)
[facility] Emergency Department Provider Note ____________________________________________  Time seen: 2115  I have reviewed the triage vital signs and the nursing notes.  HISTORY  Chief Complaint  Cough   HPI David Salinas is a 25 y.o. male presents to the ED via EMS for evaluation of productive cough, after he noted a single episode of bloody phlegm today. He describes onset was this afternoon, without frank blood. He describes dark blood streaking in the sputum he coughed up. He denies any outright chest pain but does note some shortness of breath this morning upon awakening. He has not had his albuterol inhaler to dose since last month. He has been dosing Mucinex for symptoms over the last few days. He otherwise denies any sick contacts, recent travel. He does give a history of asthma and previous acute bronchitis.  Past Medical History  Diagnosis Date  . Chronic bronchitis (HCC)   . Asthma     There are no active problems to display for this patient.   Past Surgical History  Procedure Laterality Date  . Tonsillectomy      Current Outpatient Rx  Name  Route  Sig  Dispense  Refill  . albuterol (PROVENTIL HFA;VENTOLIN HFA) 108 (90 Base) MCG/ACT inhaler   Inhalation   Inhale 2 puffs into the lungs every 6 (six) hours as needed for wheezing or shortness of breath.   1 Inhaler   0   . benzonatate (TESSALON PERLES) 100 MG capsule   Oral   Take 1 capsule (100 mg total) by mouth 3 (three) times daily as needed for cough (Take 1-2 per dose).   30 capsule   0   . fluticasone (FLONASE) 50 MCG/ACT nasal spray   Each Nare   Place 2 sprays into both nostrils daily.   16 g   0   . hydrOXYzine (ATARAX/VISTARIL) 50 MG tablet   Oral   Take 50 mg by mouth 3 (three) times daily as needed.         . naproxen (NAPROSYN) 500 MG tablet   Oral   Take 1 tablet (500 mg total) by mouth 2 (two) times daily with a meal.   60 tablet   2   . PARoxetine  (PAXIL) 10 MG tablet   Oral   Take 10 mg by mouth daily.         Marland Kitchen. sulfamethoxazole-trimethoprim (BACTRIM DS,SEPTRA DS) 800-160 MG per tablet   Oral   Take 1 tablet by mouth 2 (two) times daily.   20 tablet   0     Allergies Codeine and Penicillins  History reviewed. No pertinent family history.  Social History Social History  Substance Use Topics  . Smoking status: Current Every Day Smoker -- 1.00 packs/day    Types: Cigarettes  . Smokeless tobacco: None  . Alcohol Use: Yes     Comment: 3-4 drinks per day   Review of Systems  Constitutional: Negative for fever. Eyes: Negative for visual changes. ENT: Negative for sore throat. Cardiovascular: Negative for chest pain. Respiratory: Negative for shortness of breath. Reports intermittently productive cough Musculoskeletal: Negative for back pain. Skin: Negative for rash. Neurological: Negative for headaches, focal weakness or numbness. ____________________________________________  PHYSICAL EXAM:  VITAL SIGNS: ED Triage Vitals  Enc Vitals Group     BP 02/17/16 1919 134/75 mmHg     Pulse Rate 02/17/16 1919 71     Resp 02/17/16 1919 16     Temp 02/17/16 1919 98 F (36.7 C)  Temp src --      SpO2 02/17/16 1919 98 %     Weight 02/17/16 1919 130 lb (58.968 kg)     Height 02/17/16 1919  (1.676 m)     Head Cir --      Peak Flow --      Pain Score 02/17/16 2149 0     Pain Loc --      Pain Edu? --      Excl. in GC? --    Constitutional: Alert and oriented. Well appearing and in no distress. Head: Normocephalic and atraumatic.      Eyes: Conjunctivae are normal. PERRL. Normal extraocular movements      Ears: Canals clear. TMs intact bilaterally, but partially obscured by cerumen.   Nose: No congestion/rhinorrhea.   Mouth/Throat: Mucous membranes are moist.   Neck: Supple. No thyromegaly. Hematological/Lymphatic/Immunological: No cervical lymphadenopathy. Cardiovascular: Normal rate, regular  rhythm.  Respiratory: Normal respiratory effort. No wheezes/rales/rhonchi. Gastrointestinal: Soft and nontender. No distention. Musculoskeletal: Nontender with normal range of motion in all extremities.  Neurologic:  Normal gait without ataxia. Normal speech and language. No gross focal neurologic deficits are appreciated. Skin:  Skin is warm, dry and intact. No rash noted. Psychiatric: Mood and affect are normal. Patient exhibits appropriate insight and judgment. ____________________________________________   RADIOLOGY  CXR IMPRESSION: No active cardiopulmonary disease. ____________________________________________  PROCEDURES  DuoNeb x 1 ____________________________________________  INITIAL IMPRESSION / ASSESSMENT AND PLAN / ED COURSE  Patient with what appears to be in acute upper respiration infection and maybe a mild bronchitis. No radiologic evidence of infectious process. He'll be discharged with a prescription for his albuterol inhaler, Flonase, and Tessalon Perles. He is encouraged to itching dosing his Mucinex as needed for symptom relief. He will follow up with his primary care provider or return to the ED for acute respiratory distress. ____________________________________________  FINAL CLINICAL IMPRESSION(S) / ED DIAGNOSES  Final diagnoses:  URI (upper respiratory infection)  Bronchospasm with bronchitis, acute      Lissa Hoard, PA-C 02/17/16 2213  Sharyn Creamer, MD 02/18/16 0040

## 2016-02-17 NOTE — ED Notes (Signed)
Cough x 4 days with slight wheeze in triage. Decline breathing tx in ems, but is willing to accept one now. Has seasonal asthma

## 2016-02-17 NOTE — Discharge Instructions (Signed)
Acute Bronchitis Bronchitis is inflammation of the airways that extend from the windpipe into the lungs (bronchi). The inflammation often causes mucus to develop. This leads to a cough, which is the most common symptom of bronchitis.  In acute bronchitis, the condition usually develops suddenly and goes away over time, usually in a couple weeks. Smoking, allergies, and asthma can make bronchitis worse. Repeated episodes of bronchitis may cause further lung problems.  CAUSES Acute bronchitis is most often caused by the same virus that causes a cold. The virus can spread from person to person (contagious) through coughing, sneezing, and touching contaminated objects. SIGNS AND SYMPTOMS   Cough.   Fever.   Coughing up mucus.   Body aches.   Chest congestion.   Chills.   Shortness of breath.   Sore throat.  DIAGNOSIS  Acute bronchitis is usually diagnosed through a physical exam. Your health care provider will also ask you questions about your medical history. Tests, such as chest X-rays, are sometimes done to rule out other conditions.  TREATMENT  Acute bronchitis usually goes away in a couple weeks. Oftentimes, no medical treatment is necessary. Medicines are sometimes given for relief of fever or cough. Antibiotic medicines are usually not needed but may be prescribed in certain situations. In some cases, an inhaler may be recommended to help reduce shortness of breath and control the cough. A cool mist vaporizer may also be used to help thin bronchial secretions and make it easier to clear the chest.  HOME CARE INSTRUCTIONS  Get plenty of rest.   Drink enough fluids to keep your urine clear or pale yellow (unless you have a medical condition that requires fluid restriction). Increasing fluids may help thin your respiratory secretions (sputum) and reduce chest congestion, and it will prevent dehydration.   Take medicines only as directed by your health care provider.  If  you were prescribed an antibiotic medicine, finish it all even if you start to feel better.  Avoid smoking and secondhand smoke. Exposure to cigarette smoke or irritating chemicals will make bronchitis worse. If you are a smoker, consider using nicotine gum or skin patches to help control withdrawal symptoms. Quitting smoking will help your lungs heal faster.   Reduce the chances of another bout of acute bronchitis by washing your hands frequently, avoiding people with cold symptoms, and trying not to touch your hands to your mouth, nose, or eyes.   Keep all follow-up visits as directed by your health care provider.  SEEK MEDICAL CARE IF: Your symptoms do not improve after 1 week of treatment.  SEEK IMMEDIATE MEDICAL CARE IF:  You develop an increased fever or chills.   You have chest pain.   You have severe shortness of breath.  You have bloody sputum.   You develop dehydration.  You faint or repeatedly feel like you are going to pass out.  You develop repeated vomiting.  You develop a severe headache. MAKE SURE YOU:   Understand these instructions.  Will watch your condition.  Will get help right away if you are not doing well or get worse.   This information is not intended to replace advice given to you by your health care provider. Make sure you discuss any questions you have with your health care provider.   Document Released: 11/28/2004 Document Revised: 11/11/2014 Document Reviewed: 04/13/2013 Elsevier Interactive Patient Education [employer]   Your exam and chest x-ray are normal today. You appear to have a viral bronchitis or allergy-related  cough. Continue to use your OTC meds. Take the prescription meds as directed. Follow-up with your provider as needed.

## 2016-04-08 ENCOUNTER — Emergency Department
Admission: EM | Admit: 2016-04-08 | Discharge: 2016-04-09 | Disposition: A | Discharge status: Home / Self Care | Payer: 59 | Attending: Emergency Medicine | Admitting: Emergency Medicine

## 2016-04-08 ENCOUNTER — Encounter: Payer: Self-pay | Admitting: Emergency Medicine

## 2016-04-08 DIAGNOSIS — Z792 Long term (current) use of antibiotics: Secondary | ICD-10-CM | POA: Diagnosis not present

## 2016-04-08 DIAGNOSIS — Z79899 Other long term (current) drug therapy: Secondary | ICD-10-CM | POA: Diagnosis not present

## 2016-04-08 DIAGNOSIS — J45909 Unspecified asthma, uncomplicated: Secondary | ICD-10-CM | POA: Diagnosis not present

## 2016-04-08 DIAGNOSIS — R1011 Right upper quadrant pain: Secondary | ICD-10-CM | POA: Insufficient documentation

## 2016-04-08 DIAGNOSIS — F17210 Nicotine dependence, cigarettes, uncomplicated: Secondary | ICD-10-CM | POA: Diagnosis not present

## 2016-04-08 DIAGNOSIS — R109 Unspecified abdominal pain: Secondary | ICD-10-CM

## 2016-04-08 DIAGNOSIS — R1013 Epigastric pain: Secondary | ICD-10-CM | POA: Insufficient documentation

## 2016-04-08 NOTE — ED Notes (Signed)
Pt in with co ruq pain x 1 week, denies any n.v.d.

## 2016-04-09 ENCOUNTER — Emergency Department: Payer: 59

## 2016-04-09 LAB — COMPREHENSIVE METABOLIC PANEL
ALBUMIN: 4.9 g/dL (ref 3.5–5.0)
ALK PHOS: 51 U/L (ref 38–126)
ALT: 13 U/L — AB (ref 17–63)
ANION GAP: 7 (ref 5–15)
AST: 16 U/L (ref 15–41)
BUN: 16 mg/dL (ref 6–20)
CALCIUM: 9.5 mg/dL (ref 8.9–10.3)
CO2: 25 mmol/L (ref 22–32)
CREATININE: 0.99 mg/dL (ref 0.61–1.24)
Chloride: 106 mmol/L (ref 101–111)
GFR calc Af Amer: >60 mL/min (ref >60)
GFR calc non Af Amer: >60 mL/min (ref >60)
GLUCOSE: 93 mg/dL (ref 65–99)
Potassium: 3.9 mmol/L (ref 3.5–5.1)
SODIUM: 138 mmol/L (ref 135–145)
Total Bilirubin: 0.5 mg/dL (ref 0.3–1.2)
Total Protein: 7.8 g/dL (ref 6.5–8.1)

## 2016-04-09 LAB — LIPASE, BLOOD: Lipase: 29 U/L (ref 11–51)

## 2016-04-09 LAB — TROPONIN I: Troponin I: <0.03 ng/mL (ref <0.031)

## 2016-04-09 LAB — CBC
HCT: 44.6 % (ref 40.0–52.0)
HEMOGLOBIN: 15.3 g/dL (ref 13.0–18.0)
MCH: 29.7 pg (ref 26.0–34.0)
MCHC: 34.4 g/dL (ref 32.0–36.0)
MCV: 86.4 fL (ref 80.0–100.0)
Platelets: 276 10*3/uL (ref 150–440)
RBC: 5.16 MIL/uL (ref 4.40–5.90)
RDW: 13.7 % (ref 11.5–14.5)
WBC: 10.5 10*3/uL (ref 3.8–10.6)

## 2016-04-09 MED ORDER — SUCRALFATE 1 G PO TABS: 1.0000 g | ORAL_TABLET | Freq: Two times a day (BID) | ORAL | Status: DC

## 2016-04-09 MED ORDER — SUCRALFATE 1 G PO TABS
1.0000 g | ORAL_TABLET | Freq: Once | ORAL | Status: AC
  Administered 2016-04-09: 1 g via ORAL
  Filled 2016-04-09: qty 1

## 2016-04-09 MED ORDER — FAMOTIDINE 40 MG PO TABS: 40.0000 mg | ORAL_TABLET | Freq: Every evening | ORAL | Status: DC

## 2016-04-09 MED ORDER — GI COCKTAIL ~~LOC~~
30.0000 mL | Freq: Once | ORAL | Status: AC
  Administered 2016-04-09: 30 mL via ORAL
  Filled 2016-04-09: qty 30

## 2016-04-09 NOTE — ED Provider Notes (Signed)
[facility] Emergency Department Provider Note   ____________________________________________  Time seen: Approximately 2345 PM  I have reviewed the triage vital signs and the nursing notes.   HISTORY  Chief Complaint Abdominal Pain    HPI David Salinas is a 25 y.o. male who comes into the hospital today with some upper abdominal pain and right upper quadrant pain. The patient reports that this is been going on for the past week. He reports that if he stands it feels better and if he sits or lays it feels worse. He reports that the pain does not change with eating when he is drinking. The patient is not taking anything for pain at home. He reports that he does not like to take medications. He rates his pain a 3-4 out of 10 in intensity. He's had no nausea or vomiting. He does have some mild shortness of breath when he lays flat but he thinks it's because it feels as though the pain is moving up into his lungs. He reports at that point he takes shallow breaths. The patient was unsure what was going on so he decided to come into the hospital to get checked out.   Past Medical History  Diagnosis Date  . Chronic bronchitis (HCC)   . Asthma     There are no active problems to display for this patient.   Past Surgical History  Procedure Laterality Date  . Tonsillectomy      Current Outpatient Rx  Name  Route  Sig  Dispense  Refill  . albuterol (PROVENTIL HFA;VENTOLIN HFA) 108 (90 Base) MCG/ACT inhaler   Inhalation   Inhale 2 puffs into the lungs every 6 (six) hours as needed for wheezing or shortness of breath.   1 Inhaler   0   . benzonatate (TESSALON PERLES) 100 MG capsule   Oral   Take 1 capsule (100 mg total) by mouth 3 (three) times daily as needed for cough (Take 1-2 per dose).   30 capsule   0   . famotidine (PEPCID) 40 MG tablet   Oral   Take 1 tablet (40 mg total) by mouth every evening.   30 tablet   0   . fluticasone (FLONASE)  50 MCG/ACT nasal spray   Each Nare   Place 2 sprays into both nostrils daily.   16 g   0   . hydrOXYzine (ATARAX/VISTARIL) 50 MG tablet   Oral   Take 50 mg by mouth 3 (three) times daily as needed.         . naproxen (NAPROSYN) 500 MG tablet   Oral   Take 1 tablet (500 mg total) by mouth 2 (two) times daily with a meal.   60 tablet   2   . PARoxetine (PAXIL) 10 MG tablet   Oral   Take 10 mg by mouth daily.         . sucralfate (CARAFATE) 1 g tablet   Oral   Take 1 tablet (1 g total) by mouth 2 (two) times daily.   20 tablet   0   . sulfamethoxazole-trimethoprim (BACTRIM DS,SEPTRA DS) 800-160 MG per tablet   Oral   Take 1 tablet by mouth 2 (two) times daily.   20 tablet   0     Allergies Codeine and Penicillins  History reviewed. No pertinent family history.  Social History Social History  Substance Use Topics  . Smoking status: Current Every Day Smoker -- 1.00 packs/day  Types: Cigarettes  . Smokeless tobacco: None  . Alcohol Use: Yes     Comment: 3-4 drinks per day    Review of Systems Constitutional: No fever/chills Eyes: No visual changes. ENT: No sore throat. Cardiovascular: Denies chest pain. Respiratory: shortness of breath. Gastrointestinal:  abdominal pain.  No nausea, no vomiting.  No diarrhea.  No constipation. Genitourinary: Negative for dysuria. Musculoskeletal: Negative for back pain. Skin: Negative for rash. Neurological: Negative for headaches, focal weakness or numbness.  10-point ROS otherwise negative.  ____________________________________________   PHYSICAL EXAM:  VITAL SIGNS: ED Triage Vitals  Enc Vitals Group     BP 04/08/16 2325 117/76 mmHg     Pulse Rate 04/08/16 2325 57     Resp 04/08/16 2325 20     Temp 04/08/16 2325 98.3 F (36.8 C)     Temp Source 04/08/16 2325 Oral     SpO2 04/08/16 2325 100 %     Weight 04/08/16 2325 145 lb (65.772 kg)     Height 04/08/16 2325  (1.676 m)     Head Cir --      Peak  Flow --      Pain Score 04/08/16 2326 5     Pain Loc --      Pain Edu? --      Excl. in GC? --     Constitutional: Alert and oriented. Well appearing and in Mild distress. Eyes: Conjunctivae are normal. PERRL. EOMI. Head: Atraumatic. Nose: No congestion/rhinnorhea. Mouth/Throat: Mucous membranes are moist.  Oropharynx non-erythematous. Cardiovascular: Normal rate, regular rhythm. Grossly normal heart sounds.  Good peripheral circulation. Respiratory: Normal respiratory effort.  No retractions. Lungs CTAB. Gastrointestinal: Soft with epigastric left and right upper quadrant tenderness to palpation. No distention. Positive bowel sounds Musculoskeletal: No lower extremity tenderness nor edema.   Neurologic:  Normal speech and language.  Skin:  Skin is warm, dry and intact. Psychiatric: Mood and affect are normal.   ____________________________________________   LABS (all labs ordered are listed, but only abnormal results are displayed)  Labs Reviewed  COMPREHENSIVE METABOLIC PANEL - Abnormal; Notable for the following:    ALT 13 (*)    All other components within normal limits  CBC  LIPASE, BLOOD  TROPONIN I   ____________________________________________  EKG  ED ECG REPORT I, Rebecka Apley, the attending physician, personally viewed and interpreted this ECG.   Date: 04/09/2016  EKG Time: 0039  Rate: 51  Rhythm: sinus bradycardia  Axis: normal  Intervals:none  ST&T Change: none  ____________________________________________  RADIOLOGY  Right upper quadrant ultrasound: Unremarkable right upper quadrant ultrasound. ____________________________________________   PROCEDURES  Procedure(s) performed: None  Critical Care performed: No  ____________________________________________   INITIAL IMPRESSION / ASSESSMENT AND PLAN / ED COURSE  Pertinent labs & imaging results that were available during my care of the patient were reviewed by me and considered in  my medical decision making (see chart for details).  This is a 25 year old male who comes into the hospital today with some upper abdominal pain. He has been having symptoms for the past week. I did check the patient's blood work is unremarkable. I will send the patient for an ultrasound of his abdomen for further evaluation of his pain.  The patient's ultrasound is unremarkable. The patient's blood work is also unremarkable. I did give the patient a GI cocktail and some Carafate. I will discharge him to home to have her follow-up with the acute care clinic for further evaluation of his symptoms.  ____________________________________________   FINAL CLINICAL IMPRESSION(S) / ED DIAGNOSES  Final diagnoses:  Abdominal pain  Epigastric pain      NEW MEDICATIONS STARTED DURING THIS VISIT:  New Prescriptions   FAMOTIDINE (PEPCID) 40 MG TABLET    Take 1 tablet (40 mg total) by mouth every evening.   SUCRALFATE (CARAFATE) 1 G TABLET    Take 1 tablet (1 g total) by mouth 2 (two) times daily.     Note:  This document was prepared using Dragon voice recognition software and may include unintentional dictation errors.    Rebecka ApleyAllison P Memori Sammon, MD 04/09/16 [phone]

## 2016-04-09 NOTE — Discharge Instructions (Signed)

## 2017-01-06 ENCOUNTER — Encounter: Payer: Self-pay | Admitting: *Deleted

## 2017-01-06 ENCOUNTER — Emergency Department
Admission: EM | Admit: 2017-01-06 | Discharge: 2017-01-07 | Disposition: A | Discharge status: Home / Self Care | Payer: BLUE CROSS/BLUE SHIELD | Attending: Emergency Medicine | Admitting: Emergency Medicine

## 2017-01-06 DIAGNOSIS — R112 Nausea with vomiting, unspecified: Secondary | ICD-10-CM

## 2017-01-06 DIAGNOSIS — J45909 Unspecified asthma, uncomplicated: Secondary | ICD-10-CM | POA: Diagnosis not present

## 2017-01-06 DIAGNOSIS — R195 Other fecal abnormalities: Secondary | ICD-10-CM | POA: Insufficient documentation

## 2017-01-06 DIAGNOSIS — R140 Abdominal distension (gaseous): Secondary | ICD-10-CM | POA: Insufficient documentation

## 2017-01-06 DIAGNOSIS — Z87891 Personal history of nicotine dependence: Secondary | ICD-10-CM | POA: Diagnosis not present

## 2017-01-06 LAB — CBC
HCT: 47.4 % (ref 40.0–52.0)
HEMOGLOBIN: 16.3 g/dL (ref 13.0–18.0)
MCH: 29.1 pg (ref 26.0–34.0)
MCHC: 34.5 g/dL (ref 32.0–36.0)
MCV: 84.4 fL (ref 80.0–100.0)
Platelets: 295 10*3/uL (ref 150–440)
RBC: 5.61 MIL/uL (ref 4.40–5.90)
RDW: 13.7 % (ref 11.5–14.5)
WBC: 6.9 10*3/uL (ref 3.8–10.6)

## 2017-01-06 LAB — COMPREHENSIVE METABOLIC PANEL
ALBUMIN: 4.9 g/dL (ref 3.5–5.0)
ALK PHOS: 53 U/L (ref 38–126)
ALT: 28 U/L (ref 17–63)
AST: 23 U/L (ref 15–41)
Anion gap: 7 (ref 5–15)
BILIRUBIN TOTAL: 0.5 mg/dL (ref 0.3–1.2)
BUN: 6 mg/dL (ref 6–20)
CALCIUM: 9.3 mg/dL (ref 8.9–10.3)
CO2: 28 mmol/L (ref 22–32)
Chloride: 103 mmol/L (ref 101–111)
Creatinine, Ser: 1.12 mg/dL (ref 0.61–1.24)
GFR calc Af Amer: >60 mL/min (ref >60)
GFR calc non Af Amer: >60 mL/min (ref >60)
GLUCOSE: 91 mg/dL (ref 65–99)
POTASSIUM: 4 mmol/L (ref 3.5–5.1)
Sodium: 138 mmol/L (ref 135–145)
TOTAL PROTEIN: 8 g/dL (ref 6.5–8.1)

## 2017-01-06 LAB — URINALYSIS, COMPLETE (UACMP) WITH MICROSCOPIC
Bacteria, UA: NONE SEEN
Bilirubin Urine: NEGATIVE
Glucose, UA: NEGATIVE mg/dL
HGB URINE DIPSTICK: NEGATIVE
Ketones, ur: NEGATIVE mg/dL
Leukocytes, UA: NEGATIVE
Nitrite: NEGATIVE
PROTEIN: NEGATIVE mg/dL
RBC / HPF: NONE SEEN RBC/hpf (ref 0–5)
SQUAMOUS EPITHELIAL / LPF: NONE SEEN
Specific Gravity, Urine: 1.002 — ABNORMAL LOW (ref 1.005–1.030)
pH: 7 (ref 5.0–8.0)

## 2017-01-06 LAB — LIPASE, BLOOD: Lipase: 17 U/L (ref 11–51)

## 2017-01-06 NOTE — ED Triage Notes (Signed)
Pt c/o abdominal bloating, orthopnea, abnormal stools in color, consistency, frequency, and amount. Pt states this has occurred for past week. Pt c/o n/v starting last night for 1 episode. Pt states inability to urinate starting today. Pt states urination x 2 occurrences today w/o urge to urinate presently. Pt denies rectal bleeding and diarrhea at this time. Pt denies trauma to abdomen and chronic abdominal illness.

## 2017-01-06 NOTE — ED Notes (Signed)
Pt in with co persistent nausea that started yesterday denies any vomiting today. Denies any diarrhea for stool has been soft and tan in color since last week which is abnormal for him. States today is having decreased urination but is not painful, is drinking fluids well at home. Pt denies any abd pain, but states "feels bloated".

## 2017-01-07 ENCOUNTER — Emergency Department: Payer: BLUE CROSS/BLUE SHIELD

## 2017-01-07 MED ORDER — ONDANSETRON 4 MG PO TBDP: 4.0000 mg | ORAL_TABLET | Freq: Three times a day (TID) | ORAL | 0 refills | Status: DC | PRN

## 2017-01-07 NOTE — ED Notes (Signed)
MD in to eval

## 2017-01-07 NOTE — ED Notes (Signed)
Pt given po challenge.

## 2017-01-07 NOTE — ED Notes (Signed)
Patient discharged to home per MD order. Patient in stable condition, and deemed medically cleared by ED provider for discharge. Discharge instructions reviewed with patient/family using "Teach Back"; verbalized understanding of medication education and administration, and information about follow-up care. Denies further concerns. ° °

## 2017-01-07 NOTE — ED Notes (Signed)
Pt awaiting ultrasound

## 2017-01-07 NOTE — ED Notes (Signed)
Ultrasound completed

## 2017-01-07 NOTE — ED Provider Notes (Signed)
[facility] Emergency Department Provider Note   ____________________________________________   First MD Initiated Contact with Patient 01/06/17 2356     (approximate)  I have reviewed the triage vital signs and the nursing notes.   HISTORY  Chief Complaint Emesis    HPI David Salinas is a 26 y.o. male who comes in with abdominal bloating, change in stool color nausea and vomiting. The patient reports that for the past week he's been having some pale, tan, loose stools. He's also had some abdominal bloating that makes it hard for him to eat. Yesterday he became nauseous and he vomited 1. He had just eatena sandwich and soda. He reports that he hasn't had much of an appetite. He's had some chills a few days ago and again he's been very bloated. The patient denies any fever. He is not having any abdominal pain. The patient's last bowel movement was this morning. He hasn't changed anything in his diet. He states he ate sandwiches often and chicken. His biggest concern was the stool change so he decided to come in.   Past Medical History:  Diagnosis Date  . Asthma   . Chronic bronchitis (HCC)     There are no active problems to display for this patient.   Past Surgical History:  Procedure Laterality Date  . TONSILLECTOMY      Prior to Admission medications   Medication Sig Start Date End Date Taking? Authorizing Provider  albuterol (PROVENTIL HFA;VENTOLIN HFA) 108 (90 Base) MCG/ACT inhaler Inhale 2 puffs into the lungs every 6 (six) hours as needed for wheezing or shortness of breath. 02/17/16   Jenise V Bacon Menshew, PA-C  benzonatate (TESSALON PERLES) 100 MG capsule Take 1 capsule (100 mg total) by mouth 3 (three) times daily as needed for cough (Take 1-2 per dose). 02/17/16   Jenise V Bacon Menshew, PA-C  famotidine (PEPCID) 40 MG tablet Take 1 tablet (40 mg total) by mouth every evening. 04/09/16 04/09/17  Rebecka Apley, MD  fluticasone (FLONASE)  50 MCG/ACT nasal spray Place 2 sprays into both nostrils daily. 02/17/16   Jenise V Bacon Menshew, PA-C  hydrOXYzine (ATARAX/VISTARIL) 50 MG tablet Take 50 mg by mouth 3 (three) times daily as needed.    Historical Provider, MD  ondansetron (ZOFRAN ODT) 4 MG disintegrating tablet Take 1 tablet (4 mg total) by mouth every 8 (eight) hours as needed for nausea or vomiting. 01/07/17   Rebecka Apley, MD  PARoxetine (PAXIL) 10 MG tablet Take 10 mg by mouth daily.    Historical Provider, MD  sucralfate (CARAFATE) 1 g tablet Take 1 tablet (1 g total) by mouth 2 (two) times daily. 04/09/16   Rebecka Apley, MD  sulfamethoxazole-trimethoprim (BACTRIM DS,SEPTRA DS) 800-160 MG per tablet Take 1 tablet by mouth 2 (two) times daily. 06/26/15   Chinita Pester, FNP    Allergies Codeine and Penicillins  History reviewed. No pertinent family history.  Social History Social History  Substance Use Topics  . Smoking status: Former Smoker    Packs/day: 1.00    Types: Cigarettes  . Smokeless tobacco: Never Used  . Alcohol use Yes     Comment: 3-4 drinks per day    Review of Systems Constitutional: No fever/chills Eyes: No visual changes. ENT: No sore throat. Cardiovascular: Denies chest pain. Respiratory: Denies shortness of breath. Gastrointestinal: abd bloating, nausea and vomiting, loose stools Genitourinary: Negative for dysuria. Musculoskeletal: Negative for back pain. Skin: Negative for rash. Neurological: Negative  for headaches, focal weakness or numbness.  10-point ROS otherwise negative.  ____________________________________________   PHYSICAL EXAM:  VITAL SIGNS: ED Triage Vitals  Enc Vitals Group     BP 01/06/17 2222 130/83     Pulse Rate 01/06/17 2222 72     Resp 01/06/17 2222 18     Temp 01/06/17 2222 98.3 F (36.8 C)     Temp Source 01/06/17 2222 Oral     SpO2 01/06/17 2222 99 %     Weight 01/06/17 2223 150 lb (68 kg)     Height 01/06/17 2223 5\' 6"  (1.676 m)     Head  Circumference --      Peak Flow --      Pain Score --      Pain Loc --      Pain Edu? --      Excl. in GC? --     Constitutional: Alert and oriented. Well appearing and in mild distress. Eyes: Conjunctivae are normal. PERRL. EOMI. Head: Atraumatic. Nose: No congestion/rhinnorhea. Mouth/Throat: Mucous membranes are moist.  Oropharynx non-erythematous. Cardiovascular: Normal rate, regular rhythm. Grossly normal heart sounds.  Good peripheral circulation. Respiratory: Normal respiratory effort.  No retractions. Lungs CTAB. Gastrointestinal: Soft and nontender. No distention. Positive bowel sounds Musculoskeletal: No lower extremity tenderness nor edema.  Neurologic:  Normal speech and language.  Skin:  Skin is warm, dry and intact.  Psychiatric: Mood and affect are normal.   ____________________________________________   LABS (all labs ordered are listed, but only abnormal results are displayed)  Labs Reviewed  URINALYSIS, COMPLETE (UACMP) WITH MICROSCOPIC - Abnormal; Notable for the following:       Result Value   Color, Urine COLORLESS (*)    APPearance CLEAR (*)    Specific Gravity, Urine 1.002 (*)    All other components within normal limits  LIPASE, BLOOD  COMPREHENSIVE METABOLIC PANEL  CBC   ____________________________________________  EKG  none ____________________________________________  RADIOLOGY  KUB [place] abd ____________________________________________   PROCEDURES  Procedure(s) performed: None  Procedures  Critical Care performed: No  ____________________________________________   INITIAL IMPRESSION / ASSESSMENT AND PLAN / ED COURSE  Pertinent labs & imaging results that were available during my care of the patient were reviewed by me and considered in my medical decision making (see chart for details).  This is a 26 year old male who comes into the hospital today with the change in stool color as well as some abdominal bloating. He did have  one episode of vomiting. The patient has still been having some stools. I did check some blood work with a concern for biliary pathology but the patient's blood work is unremarkable. The patient's ultrasound was also negative and the patient's x-ray looking for dilated bowel loops is also negative. I am unsure what is causing the patient's symptoms. Dietary change illness but also cause of change in the stool color. I informed the patient that he needs to follow-up with the acute care clinic so they can further evaluate him and determine the appropriate referrals for the patient. Otherwise the patient has no other concerns. He was able to eat in the emergency department without any vomiting. He'll be discharged home.  Clinical Course as of Jan 07 630  Tue Jan 07, 2017  0058 Negative. DG Abdomen 1 View [AW]  0058 Normal liver, gallbladder and bile ducts. US Abdomen Limited RUQ [AW]    Clinical Course User Index [AW] Rebecka Apley, MD     ____________________________________________   FINAL CLINICAL  IMPRESSION(S) / ED DIAGNOSES  Final diagnoses:  Bloating  Nausea and vomiting, intractability of vomiting not specified, unspecified vomiting type  Acholic stool      NEW MEDICATIONS STARTED DURING THIS VISIT:  Discharge Medication List as of 01/07/2017  1:49 AM    START taking these medications   Details  ondansetron (ZOFRAN ODT) 4 MG disintegrating tablet Take 1 tablet (4 mg total) by mouth every 8 (eight) hours as needed for nausea or vomiting., Starting Tue 01/07/2017, Print         Note:  This document was prepared using Dragon voice recognition software and may include unintentional dictation errors.    Rebecka ApleyAllison P Jerett Odonohue, MD 01/07/17 [phone]

## 2017-01-07 NOTE — Discharge Instructions (Signed)
Please follow up with the acute care clinic or your primary care physician for further evaluation of this bloating and the stool color change.

## 2017-01-07 NOTE — ED Notes (Addendum)
Pt tolerated po intake without any difficulty

## 2017-04-10 IMAGING — CR DG ABDOMEN 1V
2 series · 2 of 2 positions shown · non-contrast
Comparison: None.

CLINICAL DATA: Abdominal pain and altered bowel habits. Urinary
difficulties.

EXAM:
ABDOMEN - 1 VIEW

[abdomen kub (1 of 2)]
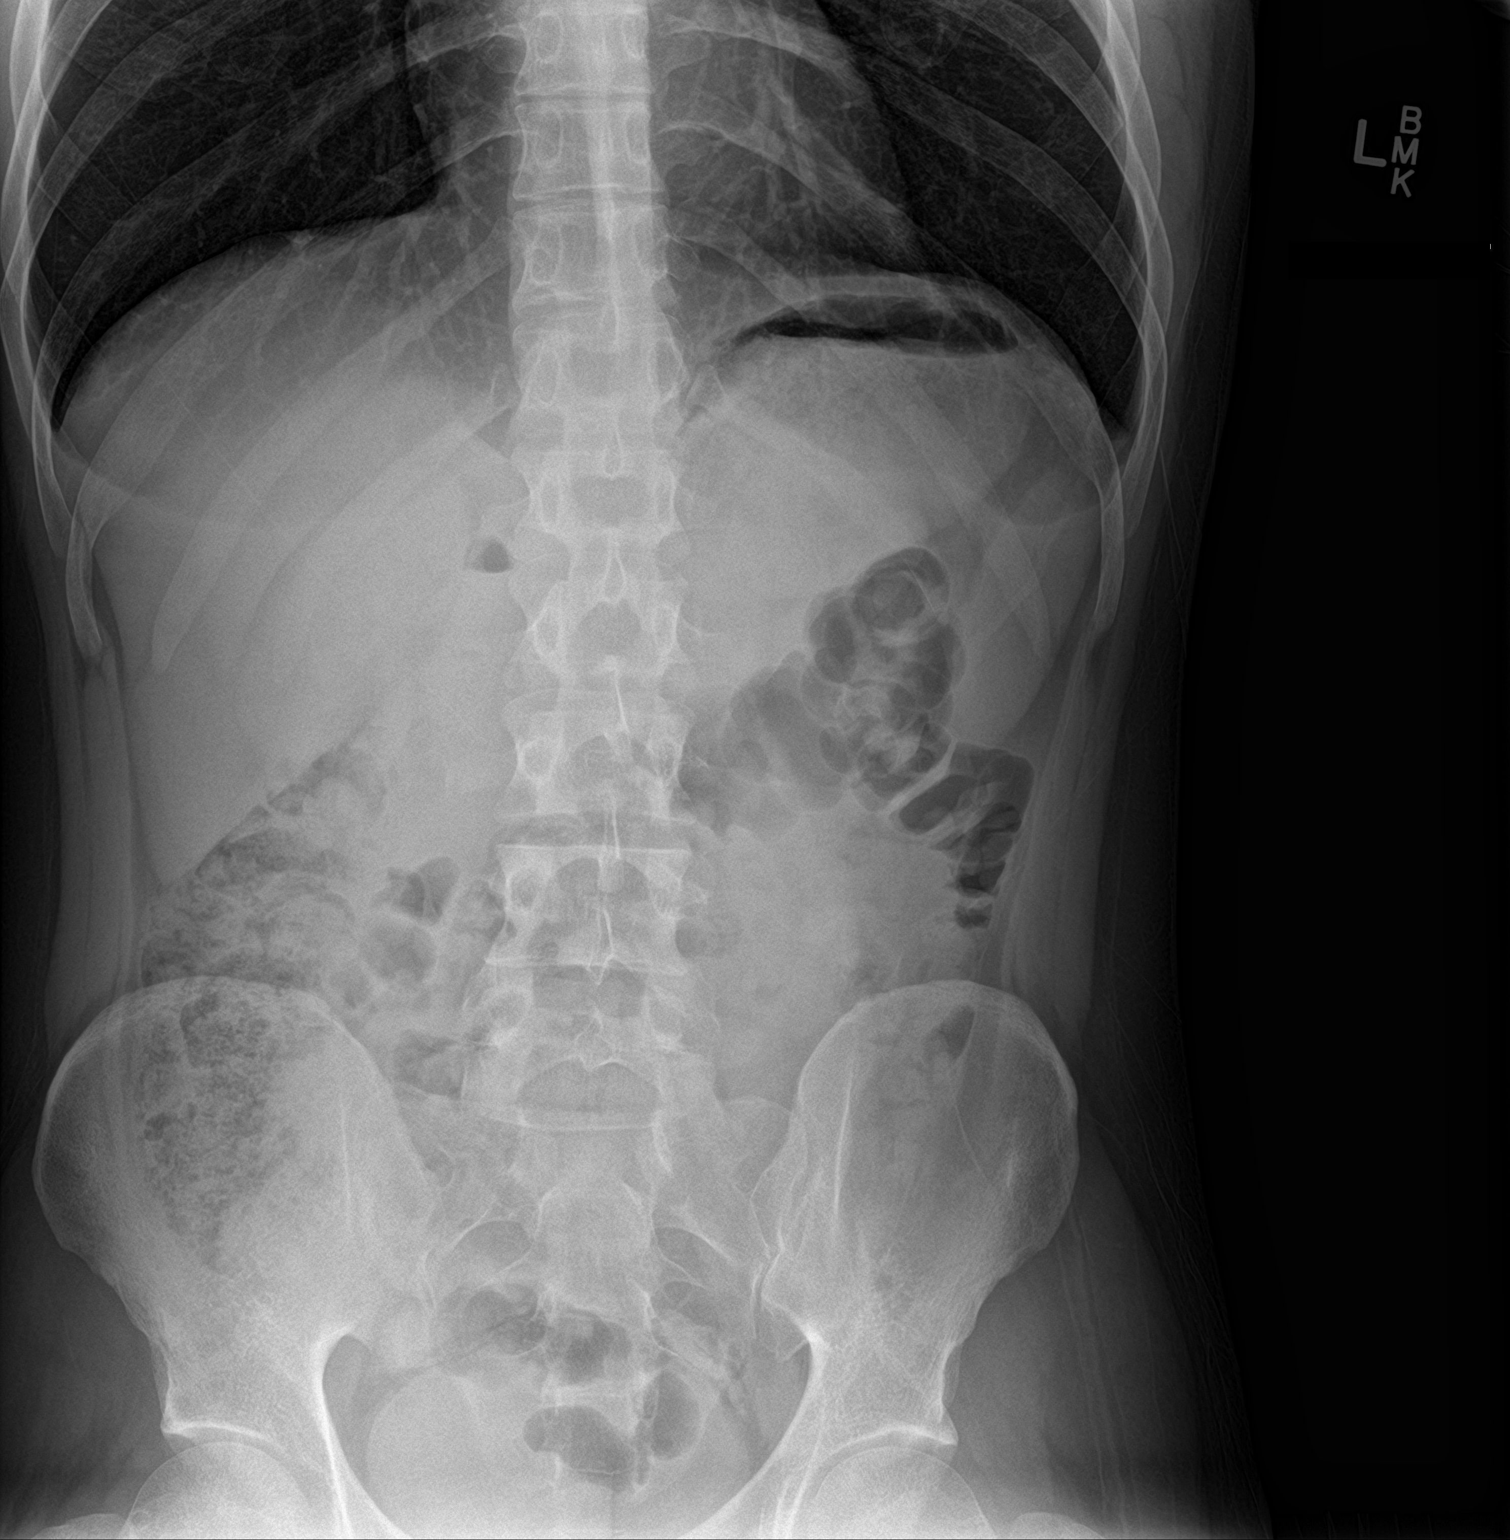

[abdomen kub (2 of 2)]
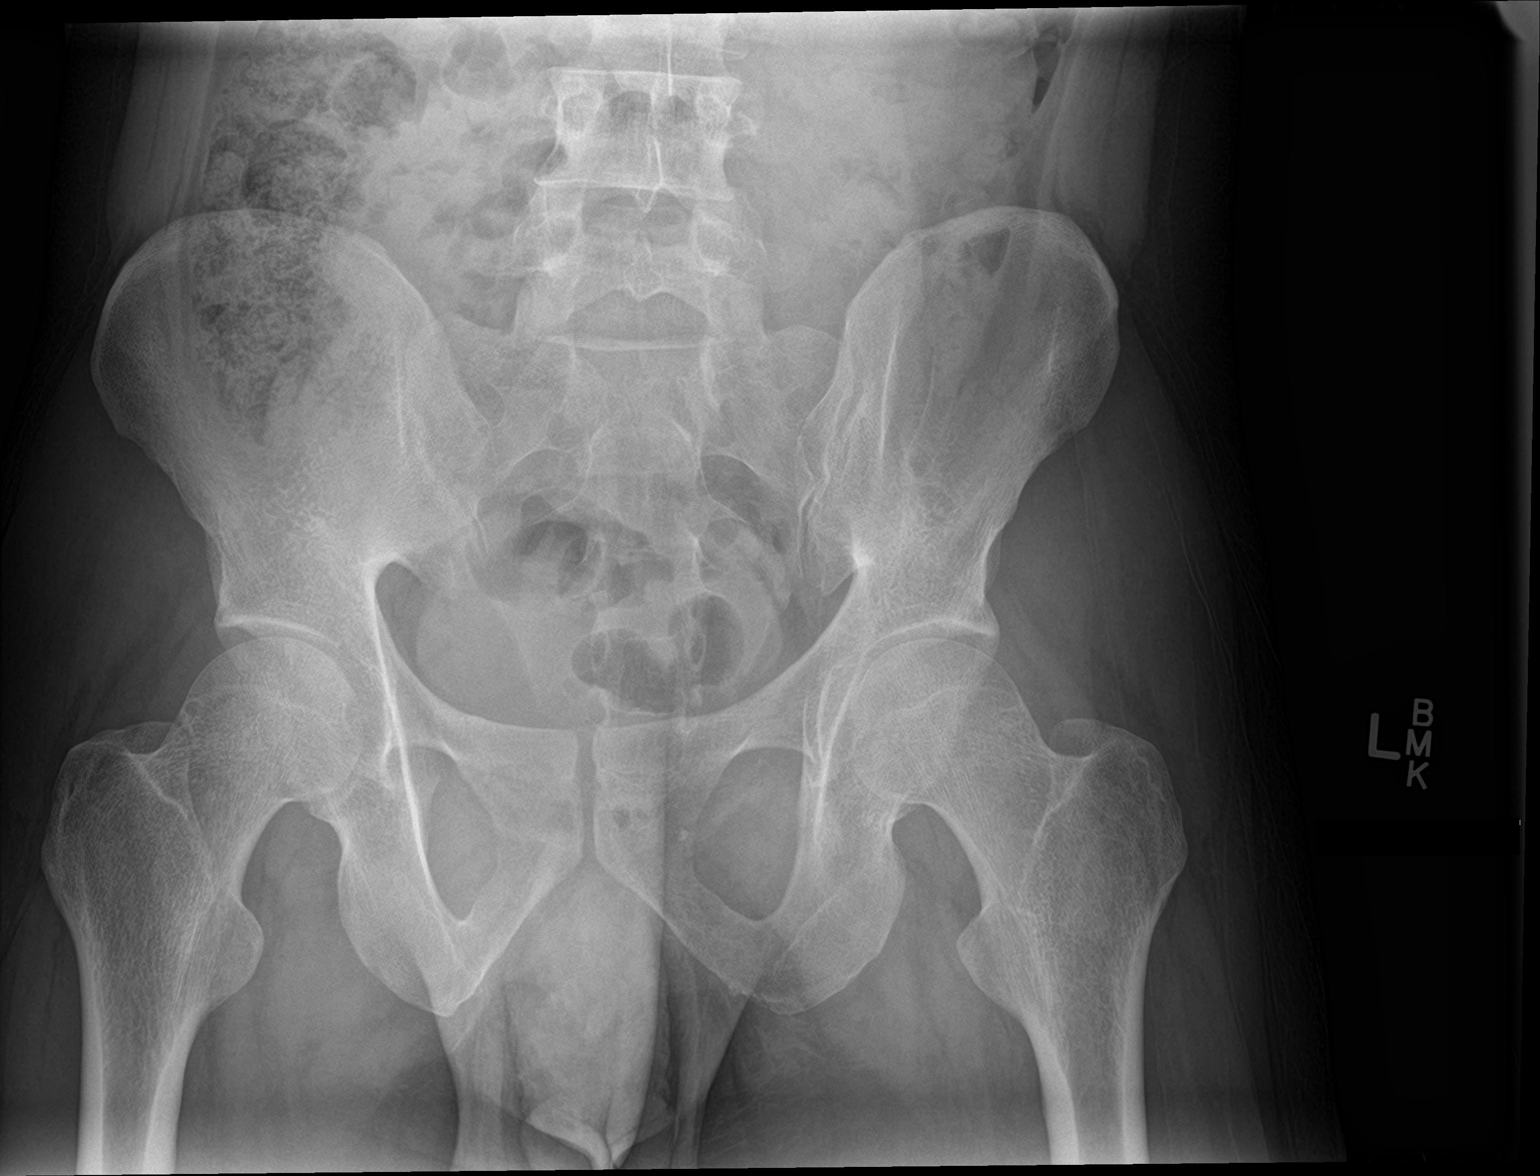

[2 of 2 positions shown; findings below may reference images not displayed]

FINDINGS: The bowel gas pattern is normal. No radio-opaque calculi or other
significant radiographic abnormality are seen.
IMPRESSION: Negative.

## 2017-04-21 ENCOUNTER — Encounter: Payer: Self-pay | Admitting: *Deleted

## 2017-04-21 DIAGNOSIS — J45909 Unspecified asthma, uncomplicated: Secondary | ICD-10-CM | POA: Diagnosis not present

## 2017-04-21 DIAGNOSIS — Z79899 Other long term (current) drug therapy: Secondary | ICD-10-CM | POA: Diagnosis not present

## 2017-04-21 DIAGNOSIS — H9202 Otalgia, left ear: Secondary | ICD-10-CM | POA: Diagnosis present

## 2017-04-21 DIAGNOSIS — Z87891 Personal history of nicotine dependence: Secondary | ICD-10-CM | POA: Diagnosis not present

## 2017-04-21 DIAGNOSIS — H6092 Unspecified otitis externa, left ear: Secondary | ICD-10-CM | POA: Insufficient documentation

## 2017-04-21 NOTE — ED Triage Notes (Signed)
Pt reports he has left earache for 2 weeks.  Pain radiates into neck.  No sore throat.   Pt alert.

## 2017-04-22 ENCOUNTER — Emergency Department
Admission: EM | Admit: 2017-04-22 | Discharge: 2017-04-22 | Disposition: A | Discharge status: Home / Self Care | Payer: BLUE CROSS/BLUE SHIELD | Attending: Emergency Medicine | Admitting: Emergency Medicine

## 2017-04-22 DIAGNOSIS — H9202 Otalgia, left ear: Secondary | ICD-10-CM

## 2017-04-22 DIAGNOSIS — H6092 Unspecified otitis externa, left ear: Secondary | ICD-10-CM

## 2017-04-22 MED ORDER — ACETAMINOPHEN 325 MG PO TABS
650.0000 mg | ORAL_TABLET | Freq: Once | ORAL | Status: AC
  Administered 2017-04-22: 650 mg via ORAL
  Filled 2017-04-22: qty 2

## 2017-04-22 MED ORDER — CIPROFLOXACIN-DEXAMETHASONE 0.3-0.1 % OT SUSP: 4.0000 [drp] | Freq: Two times a day (BID) | OTIC | 0 refills | Status: AC

## 2017-04-22 NOTE — ED Provider Notes (Signed)
[facility] Emergency Department Provider Note   ____________________________________________   First MD Initiated Contact with Patient 04/22/17 [phone]     (approximate)  I have reviewed the triage vital signs and the nursing notes.   HISTORY  Chief Complaint Otalgia    HPI David Salinas is a 26 y.o. male who comes into the hospital today with some ear pain. He reports that he's had it for a couple of weeks. He states that the pain is radiating down his left jaw and into his cheeks. The pain is under the patient's left ear. He has not been seen for it. He states it is affecting his sleep so he decided to come in to the hospital today. The patient has been taking ibuprofen at home. He denies any fever but has had some ringing in his ears. He denies any hearing loss. The patient rates his pain a 7 out of 10 in intensity currently. The patient denies any nausea or vomiting. He also denies any sore throat. He is here for evaluation.   Past Medical History:  Diagnosis Date  . Asthma   . Chronic bronchitis (HCC)     There are no active problems to display for this patient.   Past Surgical History:  Procedure Laterality Date  . TONSILLECTOMY      Prior to Admission medications   Medication Sig Start Date End Date Taking? Authorizing Provider  albuterol (PROVENTIL HFA;VENTOLIN HFA) 108 (90 Base) MCG/ACT inhaler Inhale 2 puffs into the lungs every 6 (six) hours as needed for wheezing or shortness of breath. 02/17/16   Menshew, Charlesetta IvoryJenise V Bacon, PA-C  benzonatate (TESSALON PERLES) 100 MG capsule Take 1 capsule (100 mg total) by mouth 3 (three) times daily as needed for cough (Take 1-2 per dose). 02/17/16   Menshew, Charlesetta IvoryJenise V Bacon, PA-C  ciprofloxacin-dexamethasone (CIPRODEX) OTIC suspension Place 4 drops into the left ear 2 (two) times daily. 04/22/17 04/29/17  Rebecka ApleyWebster, Allison P, MD  famotidine (PEPCID) 40 MG tablet Take 1 tablet (40 mg total) by mouth every  evening. 04/09/16 04/09/17  Rebecka ApleyWebster, Allison P, MD  fluticasone (FLONASE) 50 MCG/ACT nasal spray Place 2 sprays into both nostrils daily. 02/17/16   Menshew, Charlesetta IvoryJenise V Bacon, PA-C  hydrOXYzine (ATARAX/VISTARIL) 50 MG tablet Take 50 mg by mouth 3 (three) times daily as needed.    [provider]  ondansetron (ZOFRAN ODT) 4 MG disintegrating tablet Take 1 tablet (4 mg total) by mouth every 8 (eight) hours as needed for nausea or vomiting. 01/07/17   Rebecka ApleyWebster, Allison P, MD  PARoxetine (PAXIL) 10 MG tablet Take 10 mg by mouth daily.    [provider]  sucralfate (CARAFATE) 1 g tablet Take 1 tablet (1 g total) by mouth 2 (two) times daily. 04/09/16   Rebecka ApleyWebster, Allison P, MD  sulfamethoxazole-trimethoprim (BACTRIM DS,SEPTRA DS) 800-160 MG per tablet Take 1 tablet by mouth 2 (two) times daily. 06/26/15   Kem Boroughsriplett, Cari B, FNP    Allergies Codeine and Penicillins  No family history on file.  Social History Social History  Substance Use Topics  . Smoking status: Former Smoker    Packs/day: 1.00    Types: Cigarettes  . Smokeless tobacco: Never Used  . Alcohol use Yes     Comment: 3-4 drinks per day    Review of Systems  Constitutional: No fever/chills Eyes: No visual changes. ENT: Left ear pain. Cardiovascular: Denies chest pain. Respiratory: Denies shortness of breath. Gastrointestinal: No abdominal pain.  No  nausea, no vomiting.  No diarrhea.  No constipation. Genitourinary: Negative for dysuria. Musculoskeletal: Negative for back pain. Skin: Negative for rash. Neurological: Negative for headaches, focal weakness or numbness.   ____________________________________________   PHYSICAL EXAM:  VITAL SIGNS: ED Triage Vitals  Enc Vitals Group     BP 04/21/17 2344 137/79     Pulse Rate 04/21/17 2344 70     Resp 04/21/17 2344 18     Temp 04/21/17 2344 98.2 F (36.8 C)     Temp Source 04/21/17 2344 Oral     SpO2 04/21/17 2344 100 %     Weight 04/21/17 2342 160 lb (72.6 kg)      Height 04/21/17 2342 5\' 6"  (1.676 m)     Head Circumference --      Peak Flow --      Pain Score 04/21/17 2342 7     Pain Loc --      Pain Edu? --      Excl. in GC? --     Constitutional: Alert and oriented. Well appearing and in Mild distress. Ears: Cerumen impaction on the right, TM gray flat and dull on the left with some mild canal erythema Eyes: Conjunctivae are normal. PERRL. EOMI. Head: Atraumatic. Nose: No congestion/rhinnorhea. Mouth/Throat: Mucous membranes are moist.  Oropharynx non-erythematous. Cardiovascular: Normal rate, regular rhythm. Grossly normal heart sounds.  Good peripheral circulation. Respiratory: Normal respiratory effort.  No retractions. Lungs CTAB. Gastrointestinal: Soft and nontender. No distention.  Musculoskeletal: No lower extremity tenderness nor edema.  Neurologic:  Normal speech and language.  Skin:  Skin is warm, dry and intact. Psychiatric: Mood and affect are normal.   ____________________________________________   LABS (all labs ordered are listed, but only abnormal results are displayed)  Labs Reviewed - No data to display ____________________________________________  EKG  none ____________________________________________  RADIOLOGY  No results found.  ____________________________________________   PROCEDURES  Procedure(s) performed: please, see procedure note(s).  .Ear Cerumen Removal Date/Time: 04/22/2017 2:48 AM Performed by: Rebecka Apley Authorized by: Rebecka Apley   Consent:    Consent obtained:  Verbal   Consent given by:  Patient Procedure details:    Location:  L ear   Procedure type: curette   Post-procedure details:    Inspection:  TM intact   Hearing quality:  Normal   Patient tolerance of procedure:  Tolerated well, no immediate complications    Critical Care performed: No  ____________________________________________   INITIAL IMPRESSION / ASSESSMENT AND PLAN / ED  COURSE  Pertinent labs & imaging results that were available during my care of the patient were reviewed by me and considered in my medical decision making (see chart for details).  This is a 26 year old male who comes into the hospital with some left ear pain. The patient has some cerumen impaction bilaterally but I did clean the patient's ear on the left back and get a good view of his tympanic membrane. The patient does not appear to have otitis media but may have some mild otitis externa. I will give the patient a prescription for some Ciprodex and have the patient follow-up with the acute care clinic.      ____________________________________________   FINAL CLINICAL IMPRESSION(S) / ED DIAGNOSES  Final diagnoses:  Left ear pain  Otitis externa of left ear, unspecified chronicity, unspecified type      NEW MEDICATIONS STARTED DURING THIS VISIT:  New Prescriptions   CIPROFLOXACIN-DEXAMETHASONE (CIPRODEX) OTIC SUSPENSION    Place 4 drops into the left  ear 2 (two) times daily.     Note:  This document was prepared using Dragon voice recognition software and may include unintentional dictation errors.    Rebecka Apley, MD 04/22/17 [phone]

## 2017-04-22 NOTE — Discharge Instructions (Signed)
Please follow up with the acute care clinic for further evaluation of your ear pain.

## 2017-05-03 DIAGNOSIS — Z5321 Procedure and treatment not carried out due to patient leaving prior to being seen by health care provider: Secondary | ICD-10-CM | POA: Diagnosis not present

## 2017-05-03 DIAGNOSIS — R531 Weakness: Secondary | ICD-10-CM | POA: Insufficient documentation

## 2017-05-04 ENCOUNTER — Encounter: Payer: Self-pay | Admitting: Emergency Medicine

## 2017-05-04 ENCOUNTER — Emergency Department
Admission: EM | Admit: 2017-05-04 | Discharge: 2017-05-04 | Disposition: A | Discharge status: Home / Self Care | Payer: BLUE CROSS/BLUE SHIELD | Attending: Emergency Medicine | Admitting: Emergency Medicine

## 2017-05-04 DIAGNOSIS — Z885 Allergy status to narcotic agent status: Secondary | ICD-10-CM | POA: Insufficient documentation

## 2017-05-04 DIAGNOSIS — F17210 Nicotine dependence, cigarettes, uncomplicated: Secondary | ICD-10-CM

## 2017-05-04 DIAGNOSIS — R5383 Other fatigue: Secondary | ICD-10-CM | POA: Insufficient documentation

## 2017-05-04 DIAGNOSIS — J449 Chronic obstructive pulmonary disease, unspecified: Secondary | ICD-10-CM | POA: Diagnosis not present

## 2017-05-04 DIAGNOSIS — Z7951 Long term (current) use of inhaled steroids: Secondary | ICD-10-CM | POA: Diagnosis not present

## 2017-05-04 DIAGNOSIS — R197 Diarrhea, unspecified: Secondary | ICD-10-CM | POA: Diagnosis not present

## 2017-05-04 DIAGNOSIS — J45909 Unspecified asthma, uncomplicated: Secondary | ICD-10-CM | POA: Insufficient documentation

## 2017-05-04 DIAGNOSIS — Z880 Allergy status to penicillin: Secondary | ICD-10-CM | POA: Insufficient documentation

## 2017-05-04 DIAGNOSIS — R112 Nausea with vomiting, unspecified: Secondary | ICD-10-CM | POA: Diagnosis not present

## 2017-05-04 LAB — URINALYSIS, COMPLETE (UACMP) WITH MICROSCOPIC
BACTERIA UA: NONE SEEN
BILIRUBIN URINE: NEGATIVE
Glucose, UA: NEGATIVE mg/dL
HGB URINE DIPSTICK: NEGATIVE
Ketones, ur: NEGATIVE mg/dL
Leukocytes, UA: NEGATIVE
NITRITE: NEGATIVE
PROTEIN: NEGATIVE mg/dL
RBC / HPF: NONE SEEN RBC/hpf (ref 0–5)
Specific Gravity, Urine: 1.001 — ABNORMAL LOW (ref 1.005–1.030)
WBC UA: NONE SEEN WBC/hpf (ref 0–5)
pH: 7 (ref 5.0–8.0)

## 2017-05-04 LAB — CBC
HCT: 48.6 % (ref 40.0–52.0)
HEMATOCRIT: 53 % — AB (ref 40.0–52.0)
HEMOGLOBIN: 18 g/dL (ref 13.0–18.0)
Hemoglobin: 16.3 g/dL (ref 13.0–18.0)
MCH: 28.9 pg (ref 26.0–34.0)
MCH: 28.9 pg (ref 26.0–34.0)
MCHC: 34 g/dL (ref 32.0–36.0)
MCHC: 33.6 g/dL (ref 32.0–36.0)
MCV: 85.1 fL (ref 80.0–100.0)
MCV: 86.1 fL (ref 80.0–100.0)
PLATELETS: 315 10*3/uL (ref 150–440)
Platelets: 338 10*3/uL (ref 150–440)
RBC: 5.64 MIL/uL (ref 4.40–5.90)
RBC: 6.23 MIL/uL — AB (ref 4.40–5.90)
RDW: 13.8 % (ref 11.5–14.5)
RDW: 13.9 % (ref 11.5–14.5)
WBC: 13.6 10*3/uL — ABNORMAL HIGH (ref 3.8–10.6)
WBC: 12.6 10*3/uL — AB (ref 3.8–10.6)

## 2017-05-04 LAB — BASIC METABOLIC PANEL
Anion gap: 5 (ref 5–15)
Anion gap: 7 (ref 5–15)
BUN: 6 mg/dL (ref 6–20)
BUN: 8 mg/dL (ref 6–20)
CALCIUM: 8.9 mg/dL (ref 8.9–10.3)
CALCIUM: 9.2 mg/dL (ref 8.9–10.3)
CO2: 28 mmol/L (ref 22–32)
CO2: 30 mmol/L (ref 22–32)
Chloride: 102 mmol/L (ref 101–111)
Chloride: 103 mmol/L (ref 101–111)
Creatinine, Ser: 1.23 mg/dL (ref 0.61–1.24)
Creatinine, Ser: 1.13 mg/dL (ref 0.61–1.24)
GLUCOSE: 110 mg/dL — AB (ref 65–99)
Glucose, Bld: 103 mg/dL — ABNORMAL HIGH (ref 65–99)
POTASSIUM: 4.1 mmol/L (ref 3.5–5.1)
Potassium: 3.5 mmol/L (ref 3.5–5.1)
SODIUM: 138 mmol/L (ref 135–145)
Sodium: 137 mmol/L (ref 135–145)

## 2017-05-04 NOTE — ED Triage Notes (Signed)
First nurse note: pt states he gave plasma today. Pt states "i just feel weak after that". Pt appears in no acute distress, denies pain.

## 2017-05-04 NOTE — ED Triage Notes (Signed)
Pt checked in last night to be seen for fatigue but left before seeing the provider; says he had to leave; returns tonight still feeling fatigued after donating plasma yesterday; pt ambulatory with steady gait; talking in complete coherent sentences

## 2017-05-04 NOTE — ED Notes (Signed)
Pt ambulatory to outside without difficulty.

## 2017-05-05 ENCOUNTER — Encounter: Payer: Self-pay | Admitting: *Deleted

## 2017-05-05 ENCOUNTER — Emergency Department
Admission: EM | Admit: 2017-05-05 | Discharge: 2017-05-05 | Disposition: A | Discharge status: Home / Self Care | Payer: BLUE CROSS/BLUE SHIELD | Attending: Emergency Medicine | Admitting: Emergency Medicine

## 2017-05-05 ENCOUNTER — Emergency Department
Admission: EM | Admit: 2017-05-05 | Discharge: 2017-05-05 | Disposition: A | Discharge status: Home / Self Care | Payer: BLUE CROSS/BLUE SHIELD | Source: Home / Self Care | Attending: Emergency Medicine | Admitting: Emergency Medicine

## 2017-05-05 ENCOUNTER — Encounter: Payer: Self-pay | Admitting: Emergency Medicine

## 2017-05-05 DIAGNOSIS — R197 Diarrhea, unspecified: Secondary | ICD-10-CM | POA: Insufficient documentation

## 2017-05-05 DIAGNOSIS — R5383 Other fatigue: Secondary | ICD-10-CM

## 2017-05-05 DIAGNOSIS — J449 Chronic obstructive pulmonary disease, unspecified: Secondary | ICD-10-CM | POA: Insufficient documentation

## 2017-05-05 DIAGNOSIS — F17210 Nicotine dependence, cigarettes, uncomplicated: Secondary | ICD-10-CM | POA: Insufficient documentation

## 2017-05-05 DIAGNOSIS — R112 Nausea with vomiting, unspecified: Secondary | ICD-10-CM

## 2017-05-05 DIAGNOSIS — J45909 Unspecified asthma, uncomplicated: Secondary | ICD-10-CM | POA: Insufficient documentation

## 2017-05-05 DIAGNOSIS — Z7951 Long term (current) use of inhaled steroids: Secondary | ICD-10-CM | POA: Insufficient documentation

## 2017-05-05 LAB — CBC
HCT: 47.7 % (ref 40.0–52.0)
Hemoglobin: 16.5 g/dL (ref 13.0–18.0)
MCH: 29.9 pg (ref 26.0–34.0)
MCHC: 34.6 g/dL (ref 32.0–36.0)
MCV: 86.5 fL (ref 80.0–100.0)
PLATELETS: 325 10*3/uL (ref 150–440)
RBC: 5.52 MIL/uL (ref 4.40–5.90)
RDW: 13.6 % (ref 11.5–14.5)
WBC: 13.5 10*3/uL — ABNORMAL HIGH (ref 3.8–10.6)

## 2017-05-05 LAB — URINALYSIS, COMPLETE (UACMP) WITH MICROSCOPIC
Bacteria, UA: NONE SEEN
Bilirubin Urine: NEGATIVE
GLUCOSE, UA: NEGATIVE mg/dL
HGB URINE DIPSTICK: NEGATIVE
Ketones, ur: NEGATIVE mg/dL
Leukocytes, UA: NEGATIVE
Nitrite: NEGATIVE
PH: 7 (ref 5.0–8.0)
Protein, ur: NEGATIVE mg/dL
RBC / HPF: NONE SEEN RBC/hpf (ref 0–5)
SPECIFIC GRAVITY, URINE: 1 — AB (ref 1.005–1.030)
Squamous Epithelial / LPF: NONE SEEN
WBC UA: NONE SEEN WBC/hpf (ref 0–5)

## 2017-05-05 LAB — COMPREHENSIVE METABOLIC PANEL
ALBUMIN: 3.9 g/dL (ref 3.5–5.0)
ALK PHOS: 48 U/L (ref 38–126)
ALT: 26 U/L (ref 17–63)
AST: 22 U/L (ref 15–41)
Anion gap: 9 (ref 5–15)
BILIRUBIN TOTAL: 0.7 mg/dL (ref 0.3–1.2)
CALCIUM: 9.2 mg/dL (ref 8.9–10.3)
CO2: 24 mmol/L (ref 22–32)
CREATININE: 0.92 mg/dL (ref 0.61–1.24)
Chloride: 100 mmol/L — ABNORMAL LOW (ref 101–111)
GFR calc Af Amer: >60 mL/min (ref >60)
GFR calc non Af Amer: >60 mL/min (ref >60)
GLUCOSE: 136 mg/dL — AB (ref 65–99)
Potassium: 3.3 mmol/L — ABNORMAL LOW (ref 3.5–5.1)
SODIUM: 133 mmol/L — AB (ref 135–145)
Total Protein: 6.6 g/dL (ref 6.5–8.1)

## 2017-05-05 LAB — LIPASE, BLOOD: Lipase: 24 U/L (ref 11–51)

## 2017-05-05 MED ORDER — ONDANSETRON HCL 4 MG PO TABS: 4.0000 mg | ORAL_TABLET | Freq: Three times a day (TID) | ORAL | 0 refills | Status: DC | PRN

## 2017-05-05 MED ORDER — SODIUM CHLORIDE 0.9 % IV BOLUS (SEPSIS)
1000.0000 mL | Freq: Once | INTRAVENOUS | Status: AC
  Administered 2017-05-05: 1000 mL via INTRAVENOUS

## 2017-05-05 NOTE — ED Triage Notes (Signed)
Pt reports vomiting twice today.   Pt  Was seen in er last night with similar sx.  Pt alert.  Pt also reports upper abd pain.   Pt alert.

## 2017-05-05 NOTE — ED Provider Notes (Signed)
[facility] Emergency Department Provider Note  ____________________________________________   First MD Initiated Contact with Patient 05/05/17 [phone]     (approximate)  I have reviewed the triage vital signs and the nursing notes.   HISTORY  Chief Complaint Fatigue    HPI David Salinas is a 26 y.o. male who is generally healthy although he has many prior ED visits over the last few years who presents for evaluation of fatigue.  He states that he has been feeling fatigued and tired since he donated plasma 2 days ago.  He has donated plasma many times in the past and he has not felt this way before.  He states he has been eating and drinking without trouble.  He denies fever/chills, chest pain, shortness of breath, nausea, vomiting, abdominal pain, dysuria.  Nothing in particular makes the patient's symptoms better nor worse.  He states the fatigue is severe.  It was relatively acute in onset after donating plasma.  He denies lightheadedness and dizziness.   Past Medical History:  Diagnosis Date  . Asthma   . Chronic bronchitis (HCC)     There are no active problems to display for this patient.   Past Surgical History:  Procedure Laterality Date  . TONSILLECTOMY      Prior to Admission medications   Medication Sig Start Date End Date Taking? Authorizing Provider  albuterol (PROVENTIL HFA;VENTOLIN HFA) 108 (90 Base) MCG/ACT inhaler Inhale 2 puffs into the lungs every 6 (six) hours as needed for wheezing or shortness of breath. 02/17/16   Menshew, Charlesetta Ivory, PA-C    Allergies Codeine and Penicillins  History reviewed. No pertinent family history.  Social History Social History  Substance Use Topics  . Smoking status: Current Every Day Smoker    Packs/day: 0.50    Types: Cigarettes  . Smokeless tobacco: Never Used  . Alcohol use No     Comment: 3-4 drinks per day    Review of Systems Constitutional: No fever/chills.  Generalized  fatigue Cardiovascular: Denies chest pain. Respiratory: Denies shortness of breath. Gastrointestinal: No abdominal pain.  No nausea, no vomiting.  No diarrhea.  No constipation. Genitourinary: Negative for dysuria. Musculoskeletal: Negative for neck pain.  Negative for back pain. Integumentary: Negative for rash. Neurological: Negative for headaches, focal weakness or numbness.   ____________________________________________   PHYSICAL EXAM:  VITAL SIGNS: ED Triage Vitals  Enc Vitals Group     BP -- 137/79     Pulse Rate 05/04/17 2244 84     Resp 05/04/17 2244 16     Temp 05/04/17 2244 98.2 F (36.8 C)     Temp Source 05/04/17 2244 Oral     SpO2 05/04/17 2244 100 %     Weight 05/04/17 2232 72.6 kg (160 lb)     Height 05/04/17 2232 1.676 m (5\' 6" )     Head Circumference --      Peak Flow --      Pain Score 05/04/17 2232 0     Pain Loc --      Pain Edu? --      Excl. in GC? --     Constitutional: Alert and oriented. Well appearing and in no acute distress. Eyes: Conjunctivae are normal.  Head: Atraumatic. Neck: No stridor.  No meningeal signs.   Cardiovascular: Normal rate, regular rhythm. Good peripheral circulation. Grossly normal heart sounds. Respiratory: Normal respiratory effort.  No retractions. Lungs CTAB. Gastrointestinal: Soft and nontender. No distention.  Musculoskeletal: No lower  extremity tenderness nor edema. No gross deformities of extremities. Neurologic:  Normal speech and language. No gross focal neurologic deficits are appreciated.  Skin:  Skin is warm, dry and intact. No rash noted. Psychiatric: Mood and affect are normal. Speech and behavior are normal.  ____________________________________________   LABS (all labs ordered are listed, but only abnormal results are displayed)  Labs Reviewed  BASIC METABOLIC PANEL - Abnormal; Notable for the following:       Result Value   Glucose, Bld 103 (*)    All other components within normal limits  CBC  - Abnormal; Notable for the following:    WBC 12.6 (*)    All other components within normal limits   ____________________________________________  EKG  None - EKG not ordered by ED physician ____________________________________________  RADIOLOGY   No results found.  ____________________________________________   PROCEDURES  Critical Care performed: No   Procedure(s) performed:   Procedures   ____________________________________________   INITIAL IMPRESSION / ASSESSMENT AND PLAN / ED COURSE  Pertinent labs & imaging results that were available during my care of the patient were reviewed by me and considered in my medical decision making (see chart for details).  The patient has normal vital signs and has been resting for several hours awaiting evaluation.  He has no signs or symptoms of acute or emergent medical condition.  He has no tachycardia and his chemistry and CBC are all within normal limits.  I explained to him I do not feel that there is a role for IV fluids at this point, particularly 2 days after donating plasma.  I suggested that he try to get plenty of rest and drink plenty of fluids and continue eating and drinking appropriately.  There is no evidence that any additional intervention will help at this time.  I am giving him the number of the patient navigator so that he can establish a primary care doctor.  I gave my usual and customary return precautions.         ____________________________________________  FINAL CLINICAL IMPRESSION(S) / ED DIAGNOSES  Final diagnoses:  None     MEDICATIONS GIVEN DURING THIS VISIT:  Medications - No data to display   NEW OUTPATIENT MEDICATIONS STARTED DURING THIS VISIT:  New Prescriptions   No medications on file    Modified Medications   No medications on file    Discontinued Medications   No medications on file     Note:  This document was prepared using Dragon voice recognition software and  may include unintentional dictation errors.    Loleta RoseForbach, Frida Wahlstrom, MD 05/05/17 [phone]

## 2017-05-05 NOTE — ED Provider Notes (Addendum)
[facility] Emergency Department Provider Note  ____________________________________________   I have reviewed the triage vital signs and the nursing notes.   HISTORY  Chief Complaint Emesis    HPI David Salinas is a 26 y.o. male destiny plasma was seen here yesterday for feeling generally fatigued. Patient had reassuring blood work, and felt generally better when he went home however when he went home he began to have nonbloody diarrhea and vomited 2. He is not having any abdominal pain he states he feels better at this time he wants to make sure everything is okay. He has had no fevers or chills. He denies any headache or stiff neck change in vision or hearing, he denies focal neurologic deficit chest pain or cough.  He states he feels "okay"" after throwing up. Did have lunch did not have dinner. He states he did have some epigastric abdominal discomfort while he was actually vomiting but that is completely gone now and he has no complaints   Past Medical History:  Diagnosis Date  . Asthma   . Chronic bronchitis (HCC)     There are no active problems to display for this patient.   Past Surgical History:  Procedure Laterality Date  . TONSILLECTOMY      Prior to Admission medications   Medication Sig Start Date End Date Taking? Authorizing Provider  albuterol (PROVENTIL HFA;VENTOLIN HFA) 108 (90 Base) MCG/ACT inhaler Inhale 2 puffs into the lungs every 6 (six) hours as needed for wheezing or shortness of breath. 02/17/16   Menshew, Charlesetta IvoryJenise V Bacon, PA-C    Allergies Codeine and Penicillins  No family history on file.  Social History Social History  Substance Use Topics  . Smoking status: Current Every Day Smoker    Packs/day: 0.50    Types: Cigarettes  . Smokeless tobacco: Never Used  . Alcohol use No     Comment: 3-4 drinks per day    Review of Systems Constitutional: No fever/chills Eyes: No visual changes. ENT: No sore throat. No  stiff neck no neck pain Cardiovascular: Denies chest pain. Respiratory: Denies shortness of breath. Gastrointestinal:   The history of present illness.  No constipation. Genitourinary: Negative for dysuria. Musculoskeletal: Negative lower extremity swelling Skin: Negative for rash. Neurological: Negative for severe headaches, focal weakness or numbness.   ____________________________________________   PHYSICAL EXAM:  VITAL SIGNS: ED Triage Vitals [05/05/17 1941]  Enc Vitals Group     BP 122/79     Pulse Rate 83     Resp 16     Temp 97.8 F (36.6 C)     Temp Source Oral     SpO2 99 %     Weight 160 lb (72.6 kg)     Height 5\' 6"  (1.676 m)     Head Circumference      Peak Flow      Pain Score      Pain Loc      Pain Edu?      Excl. in GC?     Constitutional: Alert and oriented. Well appearing and in no acute distress. Eyes: Conjunctivae are normal Head: Atraumatic HEENT: No congestion/rhinnorhea. Mucous membranes are moist.  Oropharynx non-erythematous Neck:   Nontender with no meningismus, no masses, no stridor Cardiovascular: Normal rate, regular rhythm. Grossly normal heart sounds.  Good peripheral circulation. Respiratory: Normal respiratory effort.  No retractions. Lungs CTAB. Abdominal: Soft and nontender. No distention. No guarding no rebound Back:  There is no focal tenderness or step  off.  there is no midline tenderness there are no lesions noted. there is no CVA tenderness Musculoskeletal: No lower extremity tenderness, no upper extremity tenderness. No joint effusions, no DVT signs strong distal pulses no edema Neurologic:  Normal speech and language. No gross focal neurologic deficits are appreciated.  Skin:  Skin is warm, dry and intact. No rash noted. Psychiatric: Mood and affect are normal. Speech and behavior are normal.  ____________________________________________   LABS (all labs ordered are listed, but only abnormal results are displayed)  Labs  Reviewed  COMPREHENSIVE METABOLIC PANEL - Abnormal; Notable for the following:       Result Value   Sodium 133 (*)    Potassium 3.3 (*)    Chloride 100 (*)    Glucose, Bld 136 (*)    BUN <5 (*)    All other components within normal limits  CBC - Abnormal; Notable for the following:    WBC 13.5 (*)    All other components within normal limits  URINALYSIS, COMPLETE (UACMP) WITH MICROSCOPIC - Abnormal; Notable for the following:    Color, Urine COLORLESS (*)    APPearance CLEAR (*)    Specific Gravity, Urine 1.000 (*)    All other components within normal limits  LIPASE, BLOOD   ____________________________________________  EKG  I personally interpreted any EKGs ordered by me or triage  ____________________________________________  RADIOLOGY  I reviewed any imaging ordered by me or triage that were performed during my shift and, if possible, patient and/or family made aware of any abnormal findings. ____________________________________________   PROCEDURES  Procedure(s) performed: None  Procedures  Critical Care performed: None  ____________________________________________   INITIAL IMPRESSION / ASSESSMENT AND PLAN / ED COURSE  Pertinent labs & imaging results that were available during my care of the patient were reviewed by me and considered in my medical decision making (see chart for details).  Administration with no complaints at this time did have some nausea and vomiting diarrhea earlier today. We'll give him IV fluid and reassessed. He is quite well-appearing with no acute complaints at this moment.  ----------------------------------------- 11:37 PM on 05/05/2017 -----------------------------------------  Patient no acute distress, he has no complaints abdomen is benign he would very much like to go home. We'll see if can tolerate by mouth prior to discharge.    ____________________________________________   FINAL CLINICAL IMPRESSION(S) / ED  DIAGNOSES  Final diagnoses:  None      This chart was dictated using voice recognition software.  Despite best efforts to proofread,  errors can occur which can change meaning.      Jeanmarie Plant, MD 05/05/17 2246    Jeanmarie Plant, MD 05/05/17 [phone]

## 2017-05-05 NOTE — ED Notes (Signed)
Patient reports feeling fatigued since donating plasma "couple" days ago and feeling that he heart rate was elevated.  Patient alert and oriented, skin warm and dry, color within normal limits.

## 2017-05-05 NOTE — Discharge Instructions (Signed)
Your workup in the Emergency Department today was reassuring.  We did not find any specific abnormalities.  We recommend you drink plenty of fluids, take your regular medications and/or any new ones prescribed today, and follow up with the doctor(s) listed in these documents as recommended.  Return to the Emergency Department if you develop new or worsening symptoms that concern you.  

## 2017-05-05 NOTE — Discharge Instructions (Signed)
Return to the emergency room for new or worrisome symptoms; follow up closely with your primary care doctor tomorrow.

## 2017-11-21 ENCOUNTER — Encounter: Payer: Self-pay | Admitting: *Deleted

## 2017-11-21 ENCOUNTER — Emergency Department
Admission: EM | Admit: 2017-11-21 | Discharge: 2017-11-21 | Disposition: A | Discharge status: Home / Self Care | Payer: Self-pay | Attending: Emergency Medicine | Admitting: Emergency Medicine

## 2017-11-21 ENCOUNTER — Emergency Department: Payer: Self-pay

## 2017-11-21 ENCOUNTER — Other Ambulatory Visit: Payer: Self-pay

## 2017-11-21 DIAGNOSIS — J45909 Unspecified asthma, uncomplicated: Secondary | ICD-10-CM | POA: Insufficient documentation

## 2017-11-21 DIAGNOSIS — F17210 Nicotine dependence, cigarettes, uncomplicated: Secondary | ICD-10-CM | POA: Insufficient documentation

## 2017-11-21 DIAGNOSIS — R110 Nausea: Secondary | ICD-10-CM | POA: Insufficient documentation

## 2017-11-21 DIAGNOSIS — R197 Diarrhea, unspecified: Secondary | ICD-10-CM | POA: Insufficient documentation

## 2017-11-21 DIAGNOSIS — R109 Unspecified abdominal pain: Secondary | ICD-10-CM | POA: Insufficient documentation

## 2017-11-21 LAB — CBC
HEMATOCRIT: 49.4 % (ref 40.0–52.0)
Hemoglobin: 16.7 g/dL (ref 13.0–18.0)
MCH: 29.8 pg (ref 26.0–34.0)
MCHC: 33.7 g/dL (ref 32.0–36.0)
MCV: 88.3 fL (ref 80.0–100.0)
PLATELETS: 315 10*3/uL (ref 150–440)
RBC: 5.6 MIL/uL (ref 4.40–5.90)
RDW: 13.7 % (ref 11.5–14.5)
WBC: 15.6 10*3/uL — AB (ref 3.8–10.6)

## 2017-11-21 LAB — COMPREHENSIVE METABOLIC PANEL
ALBUMIN: 5 g/dL (ref 3.5–5.0)
ALT: 17 U/L (ref 17–63)
AST: 21 U/L (ref 15–41)
Alkaline Phosphatase: 50 U/L (ref 38–126)
Anion gap: 8 (ref 5–15)
BUN: 14 mg/dL (ref 6–20)
CHLORIDE: 103 mmol/L (ref 101–111)
CO2: 27 mmol/L (ref 22–32)
CREATININE: 1.11 mg/dL (ref 0.61–1.24)
Calcium: 9.6 mg/dL (ref 8.9–10.3)
GFR calc Af Amer: >60 mL/min (ref >60)
GLUCOSE: 96 mg/dL (ref 65–99)
POTASSIUM: 3.8 mmol/L (ref 3.5–5.1)
Sodium: 138 mmol/L (ref 135–145)
Total Bilirubin: 1 mg/dL (ref 0.3–1.2)
Total Protein: 8.3 g/dL — ABNORMAL HIGH (ref 6.5–8.1)

## 2017-11-21 LAB — LIPASE, BLOOD: LIPASE: 69 U/L — AB (ref 11–51)

## 2017-11-21 MED ORDER — PANTOPRAZOLE SODIUM 40 MG PO TBEC: 40.0000 mg | DELAYED_RELEASE_TABLET | Freq: Every day | ORAL | 1 refills | Status: DC

## 2017-11-21 MED ORDER — ONDANSETRON 4 MG PO TBDP
4.0000 mg | ORAL_TABLET | Freq: Once | ORAL | Status: AC
Start: 2017-11-21 — End: 2017-11-21
  Administered 2017-11-21: 4 mg via ORAL

## 2017-11-21 MED ORDER — PROMETHAZINE HCL 25 MG PO TABS: 25.0000 mg | ORAL_TABLET | ORAL | 1 refills | Status: DC | PRN

## 2017-11-21 MED ORDER — PROMETHAZINE HCL 25 MG PO TABS
25.0000 mg | ORAL_TABLET | Freq: Once | ORAL | Status: AC
Start: 2017-11-21 — End: 2017-11-21
  Administered 2017-11-21: 25 mg via ORAL
  Filled 2017-11-21: qty 1

## 2017-11-21 NOTE — ED Provider Notes (Signed)
[facility] Emergency Department Provider Note       Time seen: ----------------------------------------- 5:16 PM on 11/21/2017 -----------------------------------------   I have reviewed the triage vital signs and the nursing notes.  HISTORY   Chief Complaint Nausea and Diarrhea    HPI David Salinas is a 27 y.o. male with a history of asthma and chronic bronchitis who presents to the ED for nausea and diarrhea that started today.  Patient reports she has had a dull ache in his abdomen for the day as well.  He has not had any frequent urination.  He denies any specific focal pain.  Pain is 2 out of 10.  Past Medical History:  Diagnosis Date  . Asthma   . Chronic bronchitis (HCC)     There are no active problems to display for this patient.   Past Surgical History:  Procedure Laterality Date  . TONSILLECTOMY      Allergies Codeine and Penicillins  Social History Social History   Tobacco Use  . Smoking status: Current Every Day Smoker    Packs/day: 0.50    Types: Cigarettes  . Smokeless tobacco: Never Used  Substance Use Topics  . Alcohol use: No    Comment: 3-4 drinks per day  . Drug use: No    Review of Systems Constitutional: Negative for fever. Cardiovascular: Negative for chest pain. Respiratory: Negative for shortness of breath. Gastrointestinal: Positive for abdominal pain, diarrhea, nausea Genitourinary: Negative for dysuria. Musculoskeletal: Negative for back pain. Skin: Negative for rash. Neurological: Negative for headaches, focal weakness or numbness.  All systems negative/normal/unremarkable except as stated in the HPI  ____________________________________________   PHYSICAL EXAM:  VITAL SIGNS: ED Triage Vitals  Enc Vitals Group     BP 11/21/17 1650 116/68     Pulse Rate 11/21/17 1650 91     Resp 11/21/17 1650 16     Temp 11/21/17 1650 (!) 97.4 F (36.3 C)     Temp Source 11/21/17 1650 Oral     SpO2  11/21/17 1650 100 %     Weight 11/21/17 1648 140 lb (63.5 kg)     Height 11/21/17 1648 5\' 6"  (1.676 m)     Head Circumference --      Peak Flow --      Pain Score 11/21/17 1648 2     Pain Loc --      Pain Edu? --      Excl. in GC? --     Constitutional: Alert and oriented. Well appearing and in no distress. Eyes: Conjunctivae are normal. Normal extraocular movements. ENT   Head: Normocephalic and atraumatic.   Nose: No congestion/rhinnorhea.   Mouth/Throat: Mucous membranes are moist.   Neck: No stridor. Cardiovascular: Normal rate, regular rhythm. No murmurs, rubs, or gallops. Respiratory: Normal respiratory effort without tachypnea nor retractions. Breath sounds are clear and equal bilaterally. No wheezes/rales/rhonchi. Gastrointestinal: Soft and nontender. Normal bowel sounds Musculoskeletal: Nontender with normal range of motion in extremities. No lower extremity tenderness nor edema. Neurologic:  Normal speech and language. No gross focal neurologic deficits are appreciated.  Skin:  Skin is warm, dry and intact. No rash noted. Psychiatric: Mood and affect are normal. Speech and behavior are normal.  ____________________________________________  ED COURSE:  As part of my medical decision making, I reviewed the following data within the electronic MEDICAL RECORD NUMBER History obtained from family if available, nursing notes, old chart and ekg, as well as notes from prior ED visits. Patient presented for  diarrhea, we will assess with labs and reevaluate.   Procedures ____________________________________________   LABS (pertinent positives/negatives)  Labs Reviewed  LIPASE, BLOOD - Abnormal; Notable for the following components:      Result Value   Lipase 69 (*)    All other components within normal limits  COMPREHENSIVE METABOLIC PANEL - Abnormal; Notable for the following components:   Total Protein 8.3 (*)    All other components within normal limits  CBC -  Abnormal; Notable for the following components:   WBC 15.6 (*)    All other components within normal limits  URINALYSIS, COMPLETE (UACMP) WITH MICROSCOPIC    RADIOLOGY  Abdomen 2 view Is not reveal any acute process ____________________________________________  DIFFERENTIAL DIAGNOSIS   Gastroenteritis, dehydration, electrolyte abnormality, C. difficile colitis  FINAL ASSESSMENT AND PLAN  Diarrhea, mild pancreatitis   Plan: Patient had presented for diarrhea that is likely a viral etiology. Patient's labs did reveal mild leukocytosis which is acute on chronic and mild elevations in his lipase level of uncertain etiology.  This is likely viral. Patient's imaging was negative for acute process.  Currently is feeling better, I will discharge him with antiemetics and antiacids.   Emily FilbertWilliams, Jonathan E, MD   Note: This note was generated in part or whole with voice recognition software. Voice recognition is usually quite accurate but there are transcription errors that can and very often do occur. I apologize for any typographical errors that were not detected and corrected.     Emily FilbertWilliams, Jonathan E, MD 11/21/17 [phone]

## 2017-11-21 NOTE — ED Notes (Signed)
Pt ambulatory without difficulty. VSS.  NAD. Discharge instructions, RX, follow up reviewed. All questions answered.

## 2017-11-21 NOTE — ED Triage Notes (Signed)
Pt to ED reporting nausea and 1 episode of diarrhea that started today. PT reports he has had a dull ache in his abd for the day. No changes in urination reported.   No recent antibiotic use and pt denies sharp pains in abd.

## 2018-03-18 ENCOUNTER — Emergency Department
Admission: EM | Admit: 2018-03-18 | Discharge: 2018-03-18 | Disposition: A | Discharge status: Home / Self Care | Payer: Self-pay | Attending: Student in an Organized Health Care Education/Training Program | Admitting: Student in an Organized Health Care Education/Training Program

## 2018-03-18 ENCOUNTER — Emergency Department: Payer: Self-pay

## 2018-03-18 DIAGNOSIS — J45909 Unspecified asthma, uncomplicated: Secondary | ICD-10-CM | POA: Insufficient documentation

## 2018-03-18 DIAGNOSIS — F17210 Nicotine dependence, cigarettes, uncomplicated: Secondary | ICD-10-CM | POA: Insufficient documentation

## 2018-03-18 DIAGNOSIS — R079 Chest pain, unspecified: Secondary | ICD-10-CM | POA: Insufficient documentation

## 2018-03-18 DIAGNOSIS — M940 Chondrocostal junction syndrome [Tietze]: Secondary | ICD-10-CM | POA: Insufficient documentation

## 2018-03-18 MED ORDER — INDOMETHACIN 50 MG PO CAPS: 50.0000 mg | ORAL_CAPSULE | Freq: Two times a day (BID) | ORAL | 0 refills | Status: AC

## 2018-03-18 NOTE — ED Notes (Signed)
See triage note  States he has had a cough for about 4 days   States pain is moving into back with cough and inspiration  Afebrile on arrival

## 2018-03-18 NOTE — ED Provider Notes (Signed)
[facility] Emergency Department Provider Note  ____________________________________________  Time seen: Approximately 7:18 PM  I have reviewed the triage vital signs and the nursing notes.   HISTORY  Chief Complaint Cough    HPI David Salinas is a 27 y.o. male presents to the emergency department with tenderness over  anterior and posterior costochondral joints for the past 4 days.  Patient also reports that he has pain with deep inspiration.  He has experienced intermittent cough but no wheezing.  No fever or chills.  Patient denies chest pain.  Patient denies falls or mechanisms of trauma.   Past Medical History:  Diagnosis Date  . Asthma   . Chronic bronchitis (HCC)     There are no active problems to display for this patient.   Past Surgical History:  Procedure Laterality Date  . TONSILLECTOMY      Prior to Admission medications   Medication Sig Start Date End Date Taking? Authorizing Provider  albuterol (PROVENTIL HFA;VENTOLIN HFA) 108 (90 Base) MCG/ACT inhaler Inhale 2 puffs into the lungs every 6 (six) hours as needed for wheezing or shortness of breath. 02/17/16   Menshew, Charlesetta Ivory, PA-C  indomethacin (INDOCIN) 50 MG capsule Take 1 capsule (50 mg total) by mouth 2 (two) times daily with a meal for 7 days. 03/18/18 03/25/18  Orvil Feil, PA-C  ondansetron (ZOFRAN) 4 MG tablet Take 1 tablet (4 mg total) by mouth every 8 (eight) hours as needed for nausea or vomiting. 05/05/17   Jeanmarie Plant, MD  pantoprazole (PROTONIX) 40 MG tablet Take 1 tablet (40 mg total) by mouth daily. 11/21/17 11/21/18  Emily Filbert, MD  promethazine (PHENERGAN) 25 MG tablet Take 1 tablet (25 mg total) by mouth every 4 (four) hours as needed for nausea or vomiting. 11/21/17   Emily Filbert, MD    Allergies Codeine and Penicillins  No family history on file.  Social History Social History   Tobacco Use  . Smoking status: Current Every Day  Smoker    Packs/day: 0.50    Types: Cigarettes  . Smokeless tobacco: Never Used  Substance Use Topics  . Alcohol use: No    Comment: 3-4 drinks per day  . Drug use: No     Review of Systems  Constitutional: No fever/chills Eyes: No visual changes. No discharge ENT: No upper respiratory complaints. Cardiovascular: no chest pain. Respiratory: no cough. No SOB. Gastrointestinal: No abdominal pain.  No nausea, no vomiting.  No diarrhea.  No constipation. Genitourinary: Negative for dysuria. No hematuria Musculoskeletal: Patient has chest wall discomfort.  Skin: Negative for rash, abrasions, lacerations, ecchymosis. Neurological: Negative for headaches, focal weakness or numbness.   ____________________________________________   PHYSICAL EXAM:  VITAL SIGNS: ED Triage Vitals  Enc Vitals Group     BP 03/18/18 1655 133/73     Pulse Rate 03/18/18 1655 90     Resp 03/18/18 1655 18     Temp 03/18/18 1655 98.3 F (36.8 C)     Temp Source 03/18/18 1655 Oral     SpO2 03/18/18 1655 100 %     Weight 03/18/18 1656 145 lb (65.8 kg)     Height 03/18/18 1656  (1.778 m)     Head Circumference --      Peak Flow --      Pain Score 03/18/18 1656 0     Pain Loc --      Pain Edu? --      Excl.  in GC? --      Constitutional: Alert and oriented. Well appearing and in no acute distress. Eyes: Conjunctivae are normal. PERRL. EOMI. Head: Atraumatic. ENT:      Ears: TMs are pearly.      Nose: No congestion/rhinnorhea.      Mouth/Throat: Mucous membranes are moist.  Neck: No stridor. No cervical spine tenderness to palpation. Cardiovascular: Normal rate, regular rhythm. Normal S1 and S2.  Good peripheral circulation. Respiratory: Normal respiratory effort without tachypnea or retractions. Lungs CTAB. Good air entry to the bases with no decreased or absent breath sounds. Musculoskeletal: Patient has tenderness over left anterior and posterior costochondral joints. Neurologic:  Normal  speech and language. No gross focal neurologic deficits are appreciated.  Skin:  Skin is warm, dry and intact. No rash noted.  ____________________________________________   LABS (all labs ordered are listed, but only abnormal results are displayed)  Labs Reviewed - No data to display ____________________________________________  EKG   ____________________________________________  RADIOLOGY Geraldo Pitter, personally viewed and evaluated these images (plain radiographs) as part of my medical decision making, as well as reviewing the written report by the radiologist.  Dg Chest 2 View  Result Date: 03/18/2018 CLINICAL DATA:  Intermittent cough and pain radiating to back with inspiration for 4 days. History of asthma, bronchitis and pneumonia. Smoker. EXAM: CHEST - 2 VIEW COMPARISON:  Chest radiograph February 17, 2016 FINDINGS: Cardiomediastinal silhouette is normal. No pleural effusions or focal consolidations. Hyperinflation. Trachea projects midline and there is no pneumothorax. Soft tissue planes and included osseous structures are non-suspicious. IMPRESSION: Hyperinflation without focal consolidation. Electronically Signed   By: Awilda Metro M.D.   On: 03/18/2018 18:55    ____________________________________________    PROCEDURES  Procedure(s) performed:    Procedures    Medications - No data to display   ____________________________________________   INITIAL IMPRESSION / ASSESSMENT AND PLAN / ED COURSE  Pertinent labs & imaging results that were available during my care of the patient were reviewed by me and considered in my medical decision making (see chart for details).  Review of the [place] CSRS was performed in accordance of the [facility] prior to dispensing any controlled drugs.     Assessment and plan Costochondritis Patient presents to the emergency department with chest wall discomfort that is reproduced with palpation over costochondral joints.   Patient was treated empirically with indomethacin. Patient was advised to follow-up with primary care as needed.  All patient questions were answered.     ____________________________________________  FINAL CLINICAL IMPRESSION(S) / ED DIAGNOSES  Final diagnoses:  Costochondritis      NEW MEDICATIONS STARTED DURING THIS VISIT:  ED Discharge Orders        Ordered    indomethacin (INDOCIN) 50 MG capsule  2 times daily with meals     03/18/18 1913          This chart was dictated using voice recognition software/Dragon. Despite best efforts to proofread, errors can occur which can change the meaning. Any change was purely unintentional.    Gasper Lloyd 03/18/18 Wendall Stade, MD 03/18/18 2028

## 2018-03-18 NOTE — ED Triage Notes (Signed)
intermittent cough, pain to back with deep inspiration X 4 days. Pt alert and oriented X4, active, cooperative, pt in NAD. RR even and unlabored, color WNL.

## 2018-06-19 IMAGING — CR DG CHEST 2V
1 series · 2 of 2 positions shown · non-contrast
Comparison: Chest radiograph February 17, 2016

CLINICAL DATA: Intermittent cough and pain radiating to back with
inspiration for 4 days. History of asthma, bronchitis and pneumonia.
Smoker.

EXAM:
CHEST - 2 VIEW

[Series 1: dg chest 2 view · 0.14mm/px · 2 of 2 slices shown]
[im 1/2]
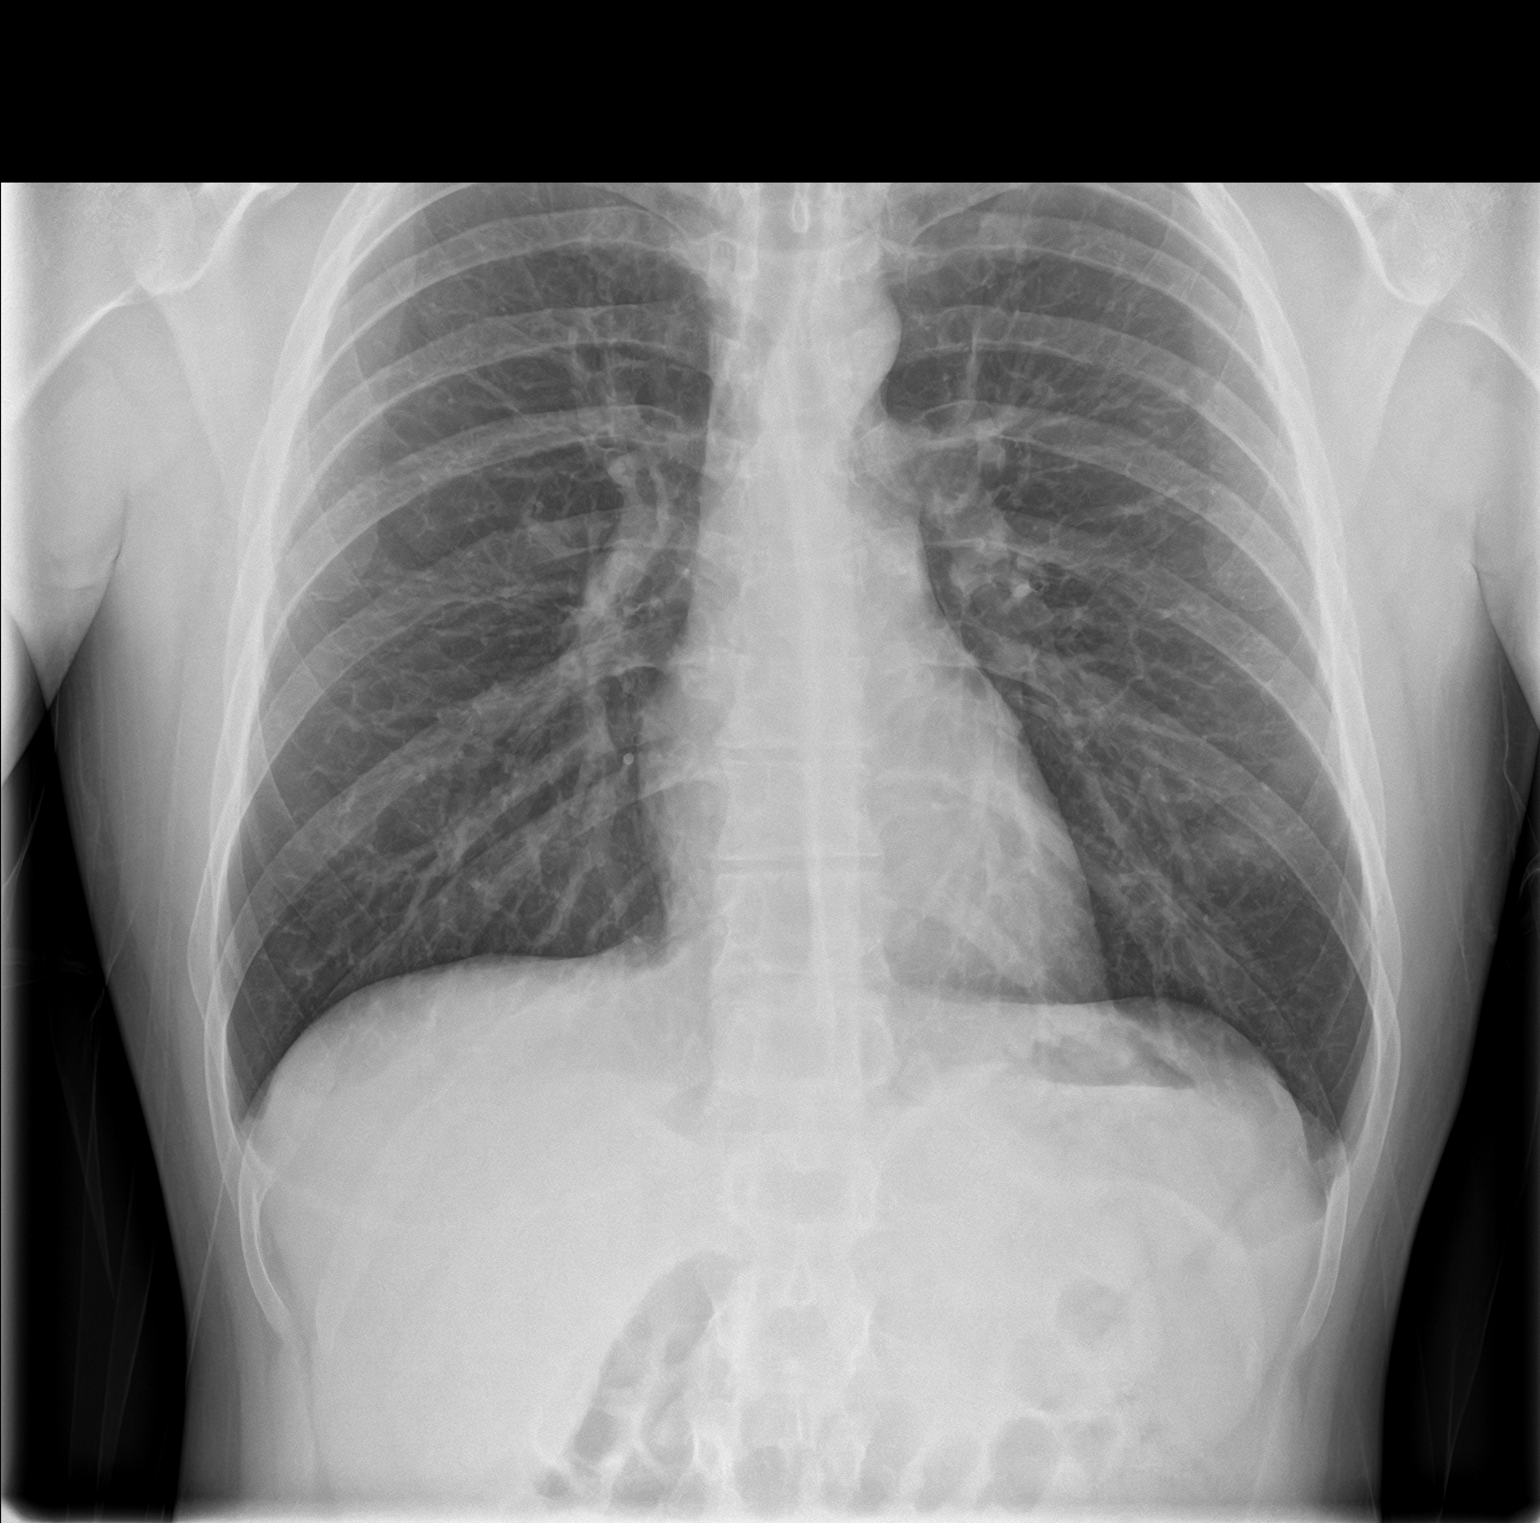
[im 2/2]
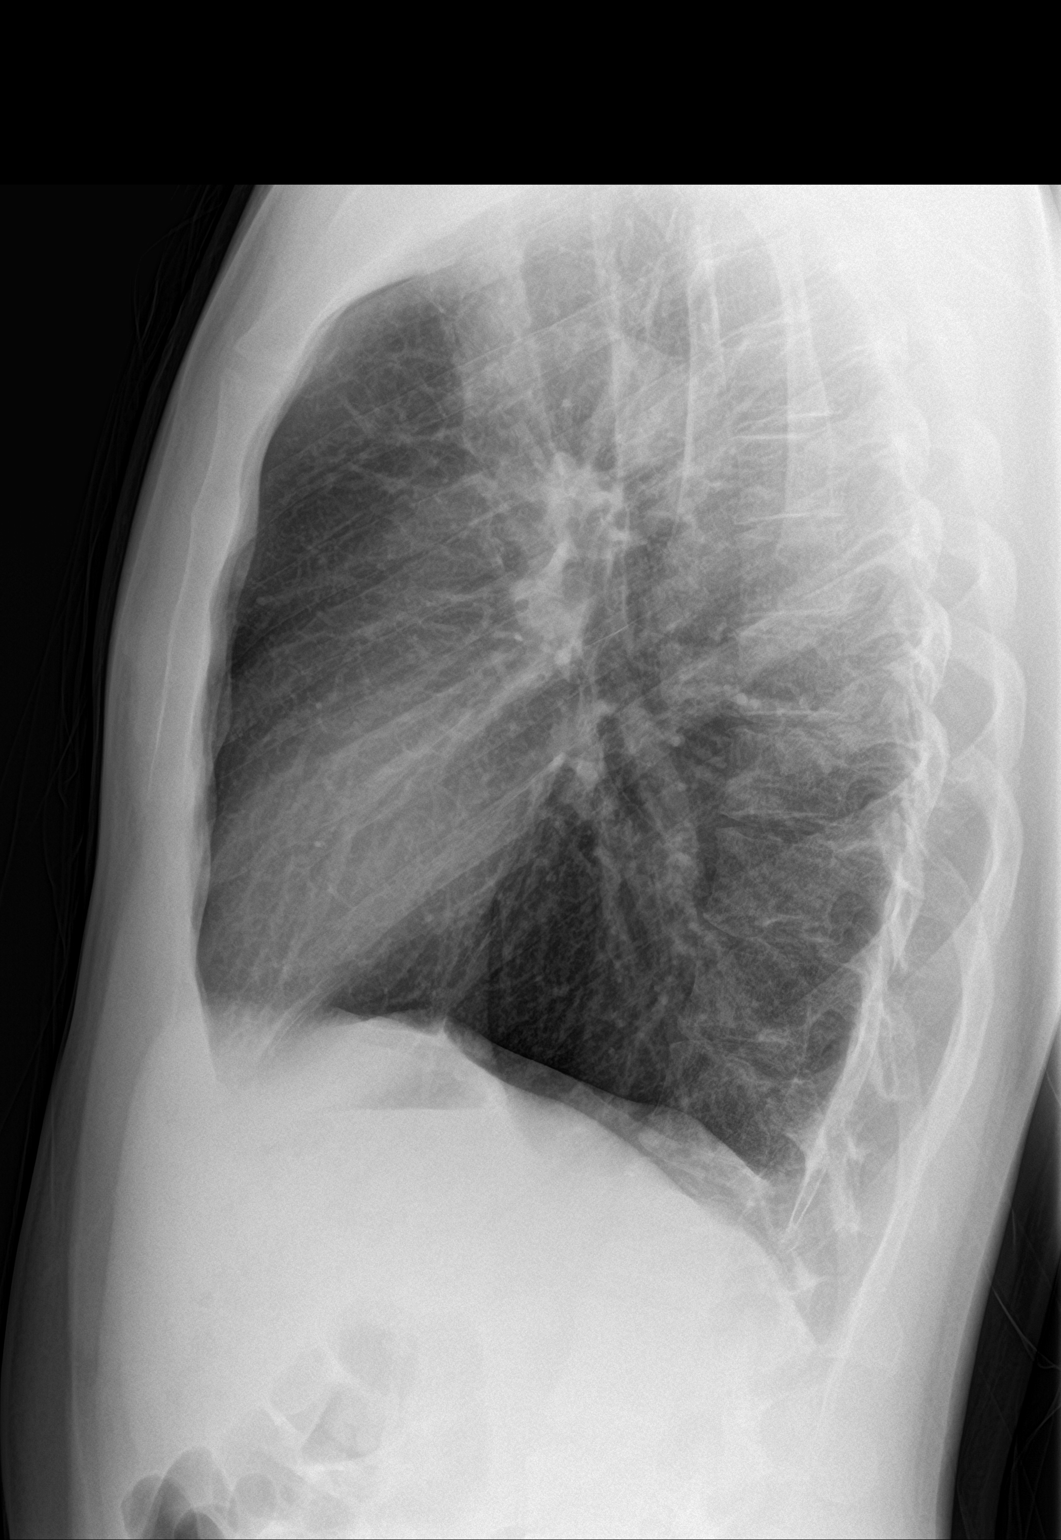

[2 of 2 positions shown; findings below may reference images not displayed]

FINDINGS: Cardiomediastinal silhouette is normal. No pleural effusions or
focal consolidations. Hyperinflation. Trachea projects midline and
there is no pneumothorax. Soft tissue planes and included osseous
structures are non-suspicious.
IMPRESSION: Hyperinflation without focal consolidation.

## 2018-12-20 ENCOUNTER — Emergency Department
Admission: EM | Admit: 2018-12-20 | Discharge: 2018-12-20 | Disposition: A | Discharge status: Home / Self Care | Payer: Self-pay | Attending: Emergency Medicine | Admitting: Emergency Medicine

## 2018-12-20 DIAGNOSIS — G44209 Tension-type headache, unspecified, not intractable: Secondary | ICD-10-CM | POA: Insufficient documentation

## 2018-12-20 DIAGNOSIS — J45909 Unspecified asthma, uncomplicated: Secondary | ICD-10-CM | POA: Insufficient documentation

## 2018-12-20 DIAGNOSIS — Z79899 Other long term (current) drug therapy: Secondary | ICD-10-CM | POA: Insufficient documentation

## 2018-12-20 DIAGNOSIS — Z880 Allergy status to penicillin: Secondary | ICD-10-CM | POA: Insufficient documentation

## 2018-12-20 DIAGNOSIS — F17210 Nicotine dependence, cigarettes, uncomplicated: Secondary | ICD-10-CM | POA: Insufficient documentation

## 2018-12-20 MED ORDER — KETOROLAC TROMETHAMINE 30 MG/ML IJ SOLN
30.0000 mg | Freq: Once | INTRAMUSCULAR | Status: AC
  Administered 2018-12-20: 30 mg via INTRAMUSCULAR
  Filled 2018-12-20: qty 1

## 2018-12-20 NOTE — Discharge Instructions (Signed)
You were seen today for an acute headache.  You received a Toradol injection today.  You can take Ibuprofen 600 to 800 mg every 8 hours as needed for headache.  If you can cannot tolerate ibuprofen, you can take Tylenol 1000 mg every 8 hours as needed for headache.  I have provided you with a work note.  Please follow-up with your PCP if symptoms persist or worsen.

## 2018-12-20 NOTE — ED Notes (Signed)
Pt c/o pounding headache (PT HAS NOT TAKEN ANYTHING FOR HEADACHE AT HOME) - he states he feels like he is getting sick with something - denies cough, runny nose, body aches, shortness of breath, or chest pain

## 2018-12-20 NOTE — ED Triage Notes (Signed)
Pt presents via POV c/o headache since 0330 this am. Denies history.

## 2018-12-20 NOTE — ED Provider Notes (Signed)
[facility] Emergency Department Provider Note ____________________________________________  Time seen: 0730  I have reviewed the triage vital signs and the nursing notes.  HISTORY  Chief Complaint  Headache   HPI David Salinas is a 28 y.o. male presents to the ER today with acute onset headache.  He reports he woke up at 330 this morning for work, which he does every day.  At that time he noticed that he had a headache in the back of his head.  He was not woken up because of the pain however.  He describes the pain is pounding.  The pain radiates down into his neck.  The pain is not worse with movement.  He denies dizziness, visual changes, sensitivity to light or sound, nausea or vomiting.  He denies any injury to the head or neck.  He reports he does not normally have headaches, but this is not the worst headache of his life.  He denies recent increase in stress.  He did not take any medication for his headache prior to arrival.  He has no family history of brain tumors or brain aneurysms.  Past Medical History:  Diagnosis Date  . Asthma   . Chronic bronchitis (HCC)     There are no active problems to display for this patient.   Past Surgical History:  Procedure Laterality Date  . TONSILLECTOMY      Prior to Admission medications   Medication Sig Start Date End Date Taking? Authorizing Provider  albuterol (PROVENTIL HFA;VENTOLIN HFA) 108 (90 Base) MCG/ACT inhaler Inhale 2 puffs into the lungs every 6 (six) hours as needed for wheezing or shortness of breath. 02/17/16   Menshew, Charlesetta Ivory, PA-C  ondansetron (ZOFRAN) 4 MG tablet Take 1 tablet (4 mg total) by mouth every 8 (eight) hours as needed for nausea or vomiting. 05/05/17   Jeanmarie Plant, MD  pantoprazole (PROTONIX) 40 MG tablet Take 1 tablet (40 mg total) by mouth daily. 11/21/17 11/21/18  Emily Filbert, MD  promethazine (PHENERGAN) 25 MG tablet Take 1 tablet (25 mg total) by mouth every 4  (four) hours as needed for nausea or vomiting. 11/21/17   Emily Filbert, MD    Allergies Codeine and Penicillins  History reviewed. No pertinent family history.  Social History Social History   Tobacco Use  . Smoking status: Current Every Day Smoker    Packs/day: 0.50    Types: Cigarettes  . Smokeless tobacco: Never Used  Substance Use Topics  . Alcohol use: No    Comment: 3-4 drinks per day  . Drug use: No    Review of Systems  Constitutional: Negative for fever, chills or body aches. Eyes: Negative for visual changes. ENT: Negative for runny nose, nasal congestion, ear pain or sore throat. Cardiovascular: Negative for chest pain or chest tightness. Respiratory: Negative for cough, wheezing or shortness of breath. Gastrointestinal: Negative for abdominal pain, nausea, vomiting and diarrhea. Musculoskeletal: Negative for neck pain. Skin: Negative for rash. Neurological: Positive for headache.  Negative for focal weakness, tingling or numbness. ____________________________________________  PHYSICAL EXAM:  VITAL SIGNS: ED Triage Vitals  Enc Vitals Group     BP 12/20/18 0709 121/66     Pulse Rate 12/20/18 0709 75     Resp 12/20/18 0709 14     Temp 12/20/18 0709 97.6 F (36.4 C)     Temp Source 12/20/18 0709 Oral     SpO2 12/20/18 0709 99 %     Weight 12/20/18  0710 150 lb (68 kg)     Height --      Head Circumference --      Peak Flow --      Pain Score 12/20/18 0709 6     Pain Loc --      Pain Edu? --      Excl. in GC? --     Constitutional: Alert and oriented. Well appearing and in no distress. Head: Normocephalic. Eyes: Conjunctivae are normal. PERRL. Normal extraocular movements Ears: Bilateral cerumen impaction. Cardiovascular: Normal rate, regular rhythm.  Respiratory: Normal respiratory effort. No wheezes/rales/rhonchi. Musculoskeletal: Normal flexion, extension and rotation of the cervical spine.  No bony tenderness noted over the cervical  spine. Neurologic:  Normal gait without ataxia. Normal speech and language. No gross focal neurologic deficits are appreciated. Skin:  Skin is warm, dry and intact. No rash noted. ____________________________________________  INITIAL IMPRESSION / ASSESSMENT AND PLAN / ED COURSE  Acute Headache:  DDX include tension headache, acute migraine without aura Exam benign No indication for CT head at this time Toradol 30 mg IM today Get rest, drink plenty of fluids Work note provided ____________________________________________  FINAL CLINICAL IMPRESSION(S) / ED DIAGNOSES  Final diagnoses:  Acute non intractable tension-type headache   Nicki Reaper, NP    Lorre Munroe, NP 12/20/18 0805    Arnaldo Natal, MD 12/20/18 1536

## 2018-12-20 NOTE — ED Triage Notes (Signed)
Denies taking medication for headache.

## 2019-02-04 ENCOUNTER — Emergency Department: Payer: Self-pay

## 2019-02-04 ENCOUNTER — Emergency Department
Admission: EM | Admit: 2019-02-04 | Discharge: 2019-02-04 | Disposition: A | Discharge status: Home / Self Care | Payer: Self-pay | Attending: Emergency Medicine | Admitting: Emergency Medicine

## 2019-02-04 ENCOUNTER — Other Ambulatory Visit: Payer: Self-pay

## 2019-02-04 DIAGNOSIS — B9789 Other viral agents as the cause of diseases classified elsewhere: Secondary | ICD-10-CM | POA: Insufficient documentation

## 2019-02-04 DIAGNOSIS — J45909 Unspecified asthma, uncomplicated: Secondary | ICD-10-CM | POA: Insufficient documentation

## 2019-02-04 DIAGNOSIS — F17210 Nicotine dependence, cigarettes, uncomplicated: Secondary | ICD-10-CM | POA: Insufficient documentation

## 2019-02-04 DIAGNOSIS — J069 Acute upper respiratory infection, unspecified: Secondary | ICD-10-CM | POA: Insufficient documentation

## 2019-02-04 NOTE — ED Notes (Signed)
X-ray at bedside

## 2019-02-04 NOTE — ED Notes (Signed)
ED Provider at bedside. 

## 2019-02-04 NOTE — Discharge Instructions (Addendum)
QUARANTINE INSTRUCTION ° °Follow these instructions at home: ° °Protecting others °To avoid spreading the illness to other people: °Quarantine in your home until you have had no cough and fever for 10 days. Household members should also be quarantine for at least 14 days after being exposed to you. °Wash your hands often with soap and water. If soap and water are not available, use an alcohol-based hand sanitizer. If you have not cleaned your hands, do not touch your face. °Make sure that all people in your household wash their hands well and often. °Cover your nose and mouth when you cough or sneeze. °Throw away used tissues. °Stay home if you have any cold-like or flu-like symptoms. °General instructions °Take over-the-counter and prescription medicines only as told by your health care provider. °If you need medication for fever take tylenol and avoid NSAIDs °Drink enough fluid to keep your urine pale yellow. °Rest at home as directed by your health care provider. °Do not give aspirin to a child with the flu, because of the association with Reye's syndrome. °Do not use tobacco products, including cigarettes, chewing tobacco, and e-cigarettes. If you need help quitting, ask your health care provider. °Keep all follow-up visits as told by your health care provider. This is important. °How is this prevented? °Avoid areas where an outbreak has been reported. °Avoid large groups of people. °Keep a safe distance from people who are coughing and sneezing. °Do not touch your face if you have not cleaned your hands. °When you are around people who are sick or might be sick, wear a mask to protect yourself. °Contact a health care provider if: °You have symptoms of SARS (cough, fever, chest pain, shortness of breath) that are not getting better at home. °You have a fever. °If you have no difficulty breathing contact your doctor or call Poison Control COVID hotline at 1-866-462-3821. If you have difficulty breathing go to  your local ER or call 911  ° °

## 2019-02-04 NOTE — ED Triage Notes (Signed)
Pt in with co dry cough and runny nose for few days. Denies any fever.

## 2019-02-04 NOTE — ED Notes (Signed)
Pt alert and oriented X4, active, cooperative, pt in NAD. RR even and unlabored, color WNL.  Pt informed to return if any life threatening symptoms occur.  Discharge and followup instructions reviewed. Ambulates safely. Instructed to keep mask on until leaving ER.

## 2019-02-04 NOTE — ED Provider Notes (Signed)
[facility] Emergency Department Provider Note  ____________________________________________  Time seen: Approximately 7:54 AM  I have reviewed the triage vital signs and the nursing notes.   HISTORY  Chief Complaint Cough   HPI David Salinas is a 28 y.o. male with a history of asthma and chronic bronchitis who presents for evaluation of congestion and cough.  Patient reports 2 days of dry cough and mild congestion.  No chest pain, no shortness of breath, no fever, no vomiting, no diarrhea.  No known exposures or travel to endemic regions of Covid 19.  Past Medical History:  Diagnosis Date  . Asthma   . Chronic bronchitis (HCC)     Past Surgical History:  Procedure Laterality Date  . TONSILLECTOMY      Prior to Admission medications   Medication Sig Start Date End Date Taking? Authorizing Provider  albuterol (PROVENTIL HFA;VENTOLIN HFA) 108 (90 Base) MCG/ACT inhaler Inhale 2 puffs into the lungs every 6 (six) hours as needed for wheezing or shortness of breath. 02/17/16   Menshew, Charlesetta Ivory, PA-C  ondansetron (ZOFRAN) 4 MG tablet Take 1 tablet (4 mg total) by mouth every 8 (eight) hours as needed for nausea or vomiting. 05/05/17   Jeanmarie Plant, MD  pantoprazole (PROTONIX) 40 MG tablet Take 1 tablet (40 mg total) by mouth daily. 11/21/17 11/21/18  Emily Filbert, MD  promethazine (PHENERGAN) 25 MG tablet Take 1 tablet (25 mg total) by mouth every 4 (four) hours as needed for nausea or vomiting. 11/21/17   Emily Filbert, MD    Allergies Codeine and Penicillins  No family history on file.  Social History Social History   Tobacco Use  . Smoking status: Current Every Day Smoker    Packs/day: 0.50    Types: Cigarettes  . Smokeless tobacco: Never Used  Substance Use Topics  . Alcohol use: No    Comment: 3-4 drinks per day  . Drug use: No    Review of Systems  Constitutional: Negative for fever. Eyes: Negative for visual  changes. ENT: Negative for sore throat. + congestion Neck: No neck pain  Cardiovascular: Negative for chest pain. Respiratory: Negative for shortness of breath. + cough Gastrointestinal: Negative for abdominal pain, vomiting or diarrhea. Genitourinary: Negative for dysuria. Musculoskeletal: Negative for back pain. Skin: Negative for rash. Neurological: Negative for headaches, weakness or numbness. Psych: No SI or HI  ____________________________________________   PHYSICAL EXAM:  VITAL SIGNS: ED Triage Vitals  Enc Vitals Group     BP 02/04/19 0639 118/80     Pulse Rate 02/04/19 0639 (!) 59     Resp 02/04/19 0639 18     Temp 02/04/19 0639 97.6 F (36.4 C)     Temp Source 02/04/19 0639 Oral     SpO2 02/04/19 0639 100 %     Weight 02/04/19 0634 150 lb (68 kg)     Height 02/04/19 0634 5\' 6"  (1.676 m)     Head Circumference --      Peak Flow --      Pain Score 02/04/19 0634 0     Pain Loc --      Pain Edu? --      Excl. in GC? --     Constitutional: Alert and oriented. Well appearing and in no apparent distress. HEENT:      Head: Normocephalic and atraumatic.         Eyes: Conjunctivae are normal. Sclera is non-icteric.  Mouth/Throat: Mucous membranes are moist.       Neck: Supple with no signs of meningismus. Cardiovascular: Regular rate and rhythm. No murmurs, gallops, or rubs. 2+ symmetrical distal pulses are present in all extremities. No JVD. Respiratory: Normal respiratory effort. Lungs are clear to auscultation bilaterally. No wheezes, crackles, or rhonchi.  Gastrointestinal: Soft, non tender, and non distended with positive bowel sounds. No rebound or guarding. Musculoskeletal: Nontender with normal range of motion in all extremities. No edema, cyanosis, or erythema of extremities. Neurologic: Normal speech and language. Face is symmetric. Moving all extremities. No gross focal neurologic deficits are appreciated. Skin: Skin is warm, dry and intact. No rash  noted. Psychiatric: Mood and affect are normal. Speech and behavior are normal.  ____________________________________________   LABS (all labs ordered are listed, but only abnormal results are displayed)  Labs Reviewed - No data to display ____________________________________________  EKG  none  ____________________________________________  RADIOLOGY  I have personally reviewed the images performed during this visit and I agree with the Radiologist's read.   Interpretation by Radiologist:  Dg Chest Portable 1 View  Result Date: 02/04/2019 CLINICAL DATA:  Cough, runny nose EXAM: PORTABLE CHEST 1 VIEW COMPARISON:  03/18/2018 FINDINGS: Heart and mediastinal contours are within normal limits. No focal opacities or effusions. No acute bony abnormality. IMPRESSION: No active disease. Electronically Signed   By: Charlett Nose M.D.   On: 02/04/2019 07:37     ____________________________________________   PROCEDURES  Procedure(s) performed: None Procedures Critical Care performed:  None ____________________________________________   INITIAL IMPRESSION / ASSESSMENT AND PLAN / ED COURSE  28 y.o. male with a history of asthma and chronic bronchitis who presents for evaluation of congestion and cough. CXR with no abnormalities. Patient was evaluated in Emergency Department today for the symptoms described in the history of present illness. Patient was evaluated in the context of the global COVID-19 pandemic, which necessitated consideration that the patient might be at risk for infection with the SARS-CoV-2 virus that causes COVID-19. Patient is well appearing with normal work of breathing, normal sats, no tachycardia or tachypnea. Institutional protocols and algorithms that pertain to the evaluation of patients at risk for COVID-19 are in a state of rapid change based on information released by regulatory bodies including the CDC and federal and state organizations. These policies and  algorithms were followed during the patient's care in the ED and patient does not meet criteria for testing at this time. Discussed quarantine recommendations with patient.       As part of my medical decision making, I reviewed the following data within the electronic MEDICAL RECORD NUMBER Nursing notes reviewed and incorporated, Old chart reviewed, Radiograph reviewed , Notes from prior ED visits and [place] Controlled Substance Database    Pertinent labs & imaging results that were available during my care of the patient were reviewed by me and considered in my medical decision making (see chart for details).    ____________________________________________   FINAL CLINICAL IMPRESSION(S) / ED DIAGNOSES  Final diagnoses:  Viral URI with cough      NEW MEDICATIONS STARTED DURING THIS VISIT:  ED Discharge Orders    None       Note:  This document was prepared using Dragon voice recognition software and may include unintentional dictation errors.    Don Perking, [place], MD 02/04/19 [phone]

## 2019-08-13 ENCOUNTER — Other Ambulatory Visit: Payer: Self-pay

## 2019-08-13 ENCOUNTER — Encounter: Payer: Self-pay | Admitting: Emergency Medicine

## 2019-08-13 ENCOUNTER — Emergency Department
Admission: EM | Admit: 2019-08-13 | Discharge: 2019-08-13 | Disposition: A | Discharge status: Home / Self Care | Payer: Self-pay | Attending: Emergency Medicine | Admitting: Emergency Medicine

## 2019-08-13 DIAGNOSIS — Z5321 Procedure and treatment not carried out due to patient leaving prior to being seen by health care provider: Secondary | ICD-10-CM | POA: Insufficient documentation

## 2019-08-13 LAB — COMPREHENSIVE METABOLIC PANEL
ALT: 21 U/L (ref 0–44)
AST: 18 U/L (ref 15–41)
Albumin: 4.6 g/dL (ref 3.5–5.0)
Alkaline Phosphatase: 51 U/L (ref 38–126)
Anion gap: 10 (ref 5–15)
BUN: 12 mg/dL (ref 6–20)
CO2: 25 mmol/L (ref 22–32)
Calcium: 9.6 mg/dL (ref 8.9–10.3)
Chloride: 103 mmol/L (ref 98–111)
Creatinine, Ser: 1.08 mg/dL (ref 0.61–1.24)
GFR calc Af Amer: >60 mL/min (ref >60)
GFR calc non Af Amer: >60 mL/min (ref >60)
Glucose, Bld: 138 mg/dL — ABNORMAL HIGH (ref 70–99)
Potassium: 4.2 mmol/L (ref 3.5–5.1)
Sodium: 138 mmol/L (ref 135–145)
Total Bilirubin: 0.7 mg/dL (ref 0.3–1.2)
Total Protein: 7.6 g/dL (ref 6.5–8.1)

## 2019-08-13 LAB — CBC
HCT: 45.6 % (ref 39.0–52.0)
Hemoglobin: 15.3 g/dL (ref 13.0–17.0)
MCH: 28.9 pg (ref 26.0–34.0)
MCHC: 33.6 g/dL (ref 30.0–36.0)
MCV: 86 fL (ref 80.0–100.0)
Platelets: 375 10*3/uL (ref 150–400)
RBC: 5.3 MIL/uL (ref 4.22–5.81)
RDW: 13 % (ref 11.5–15.5)
WBC: 9.6 10*3/uL (ref 4.0–10.5)
nRBC: 0 % (ref 0.0–0.2)

## 2019-08-13 LAB — URINALYSIS, COMPLETE (UACMP) WITH MICROSCOPIC
Bacteria, UA: NONE SEEN
Bilirubin Urine: NEGATIVE
Glucose, UA: NEGATIVE mg/dL
Hgb urine dipstick: NEGATIVE
Ketones, ur: NEGATIVE mg/dL
Leukocytes,Ua: NEGATIVE
Nitrite: NEGATIVE
Protein, ur: NEGATIVE mg/dL
Specific Gravity, Urine: 1.001 — ABNORMAL LOW (ref 1.005–1.030)
Squamous Epithelial / LPF: NONE SEEN (ref 0–5)
WBC, UA: NONE SEEN WBC/hpf (ref 0–5)
pH: 6 (ref 5.0–8.0)

## 2019-08-13 LAB — LIPASE, BLOOD: Lipase: 24 U/L (ref 11–51)

## 2019-08-13 NOTE — ED Triage Notes (Signed)
Patient reports heavy drinking 2 days ago. Reports since then, he has had upper abdominal pain and nausea with eating, worse in the morning. Denies fever or diarrhea.

## 2019-08-13 NOTE — ED Notes (Signed)
Called   No answer in lobby  

## 2019-08-13 NOTE — ED Notes (Signed)
Called no answer in lobby  

## 2019-08-16 ENCOUNTER — Telehealth: Payer: Self-pay | Admitting: Emergency Medicine

## 2019-08-16 NOTE — Telephone Encounter (Signed)
Called patient due to lwot to inquire about condition and follow up plans. Says he is better now and he attributes it to the alcohol.  I told him that if he gets pain again he really needs to be seen and that labs were done.

## 2019-09-22 ENCOUNTER — Other Ambulatory Visit: Payer: Self-pay

## 2019-09-22 DIAGNOSIS — Z20822 Contact with and (suspected) exposure to covid-19: Secondary | ICD-10-CM

## 2019-09-24 ENCOUNTER — Telehealth: Payer: Self-pay

## 2019-09-24 LAB — NOVEL CORONAVIRUS, NAA: SARS-CoV-2, NAA: NOT DETECTED

## 2019-09-24 NOTE — Telephone Encounter (Signed)
Patient called and was informed that his COVID-19 test was negative.  He verbalized understanding of information.

## 2019-11-01 ENCOUNTER — Ambulatory Visit: Payer: Self-pay | Attending: Internal Medicine

## 2019-11-01 ENCOUNTER — Other Ambulatory Visit: Payer: Self-pay

## 2019-11-01 DIAGNOSIS — Z20822 Contact with and (suspected) exposure to covid-19: Secondary | ICD-10-CM

## 2019-11-02 LAB — NOVEL CORONAVIRUS, NAA: SARS-CoV-2, NAA: NOT DETECTED

## 2019-11-09 ENCOUNTER — Other Ambulatory Visit: Payer: Self-pay

## 2021-01-02 ENCOUNTER — Other Ambulatory Visit: Payer: Self-pay

## 2021-04-11 ENCOUNTER — Other Ambulatory Visit: Payer: Self-pay

## 2021-04-11 ENCOUNTER — Emergency Department (HOSPITAL_COMMUNITY)
Admission: EM | Admit: 2021-04-11 | Discharge: 2021-04-11 | Disposition: A | Discharge status: Home / Self Care | Payer: Self-pay | Attending: Emergency Medicine | Admitting: Emergency Medicine

## 2021-04-11 ENCOUNTER — Encounter (HOSPITAL_COMMUNITY): Payer: Self-pay | Admitting: *Deleted

## 2021-04-11 ENCOUNTER — Emergency Department (HOSPITAL_COMMUNITY): Payer: Self-pay

## 2021-04-11 DIAGNOSIS — F17210 Nicotine dependence, cigarettes, uncomplicated: Secondary | ICD-10-CM | POA: Insufficient documentation

## 2021-04-11 DIAGNOSIS — R52 Pain, unspecified: Secondary | ICD-10-CM

## 2021-04-11 DIAGNOSIS — N50811 Right testicular pain: Secondary | ICD-10-CM | POA: Insufficient documentation

## 2021-04-11 DIAGNOSIS — J45909 Unspecified asthma, uncomplicated: Secondary | ICD-10-CM | POA: Insufficient documentation

## 2021-04-11 MED ORDER — NAPROXEN 500 MG PO TABS: 500.0000 mg | ORAL_TABLET | Freq: Two times a day (BID) | ORAL | 0 refills | Status: DC

## 2021-04-11 NOTE — ED Notes (Signed)
Ultrasound tech at bedside

## 2021-04-11 NOTE — ED Triage Notes (Signed)
Pt c/o right testicle pain while having intercourse this morning. Pt reports he felt a pain in his groin area and then pushed down on the area and it felt like "something pushed back down". Pt c/o tenderness currently.

## 2021-04-11 NOTE — Discharge Instructions (Addendum)
Please take Naprosyn, 500mg  by mouth twice daily as needed for pain - this in an antiinflammatory medicine (NSAID) and is similar to ibuprofen - many people feel that it is stronger than ibuprofen and it is easier to take since it is a smaller pill.  Please use this only for 1 week - if your pain persists, you will need to follow up with your doctor in the office for ongoing guidance and pain control.     I have spoken with Dr. , the urologist, he would like to see you in the office within the week.  Please call the office today to make a follow-up appointment  If you should develop increasing pain swelling redness fevers vomiting or any other worsening or concerning symptoms please return to the ER immediately

## 2021-04-11 NOTE — ED Provider Notes (Signed)
[facility] EMERGENCY DEPARTMENT Provider Note   CSN: [id number] Arrival date & time: 04/11/21  9563     History Chief Complaint  Patient presents with  . Testicle Pain    David Salinas is a 30 y.o. male.  HPI   The patient is a 30 year old male, he denies any prior significant surgical history of his abdomen groin or scrotum.  He reports that while he was having sexual intercourse with his significant other he felt acute onset of pain in his right testicle and felt like the testicle had retracted up into the inguinal canal.  This seems to have gone back to his normal self now he still has a mild discomfort on the right side, denies any dysuria fevers chills nausea vomiting or penile discharge.  He feels significantly improved, he had spontaneous improvement, there is no specific intervention.  He has never had anything like this happen in the past  Past Medical History:  Diagnosis Date  . Asthma   . Chronic bronchitis (HCC)     There are no problems to display for this patient.   Past Surgical History:  Procedure Laterality Date  . TONSILLECTOMY         No family history on file.  Social History   Tobacco Use  . Smoking status: Current Every Day Smoker    Packs/day: 0.50    Types: Cigarettes  . Smokeless tobacco: Never Used  Vaping Use  . Vaping Use: Never used  Substance Use Topics  . Alcohol use: Yes    Comment: once weekly   . Drug use: No    Home Medications Prior to Admission medications   Medication Sig Start Date End Date Taking? Authorizing Provider  naproxen (NAPROSYN) 500 MG tablet Take 1 tablet (500 mg total) by mouth 2 (two) times daily with a meal. 04/11/21  Yes Eber Hong, MD  albuterol (PROVENTIL HFA;VENTOLIN HFA) 108 (90 Base) MCG/ACT inhaler Inhale 2 puffs into the lungs every 6 (six) hours as needed for wheezing or shortness of breath. Patient not taking: Reported on 04/11/2021 02/17/16 04/11/21  Menshew, Charlesetta Ivory, PA-C  pantoprazole  (PROTONIX) 40 MG tablet Take 1 tablet (40 mg total) by mouth daily. 11/21/17 04/11/21  Emily Filbert, MD  promethazine (PHENERGAN) 25 MG tablet Take 1 tablet (25 mg total) by mouth every 4 (four) hours as needed for nausea or vomiting. Patient not taking: Reported on 04/11/2021 11/21/17 04/11/21  Emily Filbert, MD    Allergies    Codeine and Penicillins  Review of Systems   Review of Systems  Constitutional: Negative for fever.  Gastrointestinal: Negative for nausea and vomiting.  Genitourinary: Positive for testicular pain. Negative for dysuria, penile pain, penile swelling and scrotal swelling.    Physical Exam Updated Vital Signs BP 123/87   Pulse 74   Temp 98.3 F (36.8 C) (Oral)   Resp 19   Ht 1.676 m (5\' 6" )   Wt 72.6 kg   SpO2 99%   BMI 25.82 kg/m   Physical Exam Vitals and nursing note reviewed.  Constitutional:      Appearance: He is well-developed. He is not diaphoretic.  HENT:     Head: Normocephalic and atraumatic.  Eyes:     General:        Right eye: No discharge.        Left eye: No discharge.     Conjunctiva/sclera: Conjunctivae normal.  Pulmonary:     Effort: Pulmonary effort is normal. No  respiratory distress.  Genitourinary:    Comments: Chaperone present for exam, patient has normal-appearing penis scrotum and testicles.  There is minimal tenderness superior to the testicle on the right, there is a normal cremasteric reflex bilaterally, there is no tenderness redness or concerning findings of the scrotal tissue, there is no palpable hernia, there is no penile discharge or bleeding. Skin:    General: Skin is warm and dry.     Findings: No erythema or rash.  Neurological:     Mental Status: He is alert.     Coordination: Coordination normal.     ED Results / Procedures / Treatments   Labs (all labs ordered are listed, but only abnormal results are displayed) Labs Reviewed - No data to display  EKG None  Radiology US SCROTUM  W/DOPPLER  Result Date: 04/11/2021 CLINICAL DATA:  Right-sided scrotal pain, rule out torsion EXAM: SCROTAL ULTRASOUND DOPPLER ULTRASOUND OF THE TESTICLES TECHNIQUE: Complete ultrasound examination of the testicles, epididymis, and other scrotal structures was performed. Color and spectral Doppler ultrasound were also utilized to evaluate blood flow to the testicles. COMPARISON:  12/21/2013 FINDINGS: Right testicle Measurements: 43 x 25 x 29 mm.  No mass or abnormal vascularity Left testicle Measurements:  45 x 25 x 32 mm.  No mass or abnormal vascularity Right epididymis:  Incidental cyst measuring 3 mm. Left epididymis:  Normal in size and appearance. Hydrocele:  None visualized. Varicocele:  None visualized. Pulsed Doppler interrogation of both testes demonstrates high resistance arterial waveforms at the right testicle, but preserved venous flow. These results were called by telephone at the time of interpretation on 04/11/2021 at 11:26 am to provider [facility] , who verbally acknowledged these results. IMPRESSION: Present bilateral testicular blood flow although unexpected high resistance arterial waveform on the right. Partial torsion is possible based on this exam. Electronically Signed   By: Marnee Spring M.D.   On: 04/11/2021 11:27    Procedures Procedures   Medications Ordered in ED Medications - No data to display  ED Course  I have reviewed the triage vital signs and the nursing notes.  Pertinent labs & imaging results that were available during my care of the patient were reviewed by me and considered in my medical decision making (see chart for details).    MDM Rules/Calculators/A&P                          Scrotal ultrasound, rule out torsion, patient otherwise appears well  I discussed care with Dr. Ronne Binning, he recommends seeing the patient in the office, there is blood flow on the Doppler, the patient's pain continues to be improved if not completely resolved.  Patient aware  of indications for return  No signs of testicular torsion requiring intervention at this time  Final Clinical Impression(s) / ED Diagnoses Final diagnoses:  Right testicular pain    Rx / DC Orders ED Discharge Orders         Ordered    naproxen (NAPROSYN) 500 MG tablet  2 times daily with meals        04/11/21 1156           Eber Hong, MD 04/11/21 1158

## 2021-04-13 ENCOUNTER — Ambulatory Visit
Admission: RE | Admit: 2021-04-13 | Discharge: 2021-04-13 | Disposition: A | Discharge status: Home / Self Care | Payer: Self-pay | Source: Ambulatory Visit | Attending: Urology | Admitting: Urology

## 2021-04-13 ENCOUNTER — Encounter: Payer: Self-pay | Admitting: Urology

## 2021-04-13 ENCOUNTER — Other Ambulatory Visit: Payer: Self-pay

## 2021-04-13 ENCOUNTER — Ambulatory Visit (INDEPENDENT_AMBULATORY_CARE_PROVIDER_SITE_OTHER): Payer: Self-pay | Admitting: Urology

## 2021-04-13 VITALS — BP 123/78 | HR 88 | Ht 66.0 in | Wt 160.0 lb

## 2021-04-13 DIAGNOSIS — S3022XA Contusion of scrotum and testes, initial encounter: Secondary | ICD-10-CM

## 2021-04-13 DIAGNOSIS — N50819 Testicular pain, unspecified: Secondary | ICD-10-CM | POA: Insufficient documentation

## 2021-04-13 LAB — URINALYSIS, COMPLETE
Bilirubin, UA: NEGATIVE
Glucose, UA: NEGATIVE
Ketones, UA: NEGATIVE
Leukocytes,UA: NEGATIVE
Nitrite, UA: NEGATIVE
Protein,UA: NEGATIVE
Specific Gravity, UA: 1.015 (ref 1.005–1.030)
Urobilinogen, Ur: 0.2 mg/dL (ref 0.2–1.0)
pH, UA: 7.5 (ref 5.0–7.5)

## 2021-04-13 LAB — MICROSCOPIC EXAMINATION
Bacteria, UA: NONE SEEN
Epithelial Cells (non renal): NONE SEEN /hpf (ref 0–10)

## 2021-04-13 MED ORDER — ETODOLAC 400 MG PO TABS
400.0000 mg | ORAL_TABLET | Freq: Two times a day (BID) | ORAL | 0 refills | Status: AC
Start: 2021-04-13 — End: 2021-04-27

## 2021-04-13 NOTE — Progress Notes (Signed)
04/13/2021 1:37 PM   David Salinas February 27, 1991 [id number]  Referring provider: Emogene Morgan, MD [address] [place],  [place] [address]  Chief Complaint  Patient presents with   Testicle Pain    HPI: David Salinas is a 30 y.o. male who presents for recent ED follow-up.  Presented to [facility] ED 04/11/2021 with right scrotal pain Was having intercourse and had acute onset right hemiscrotal pain due to retraction of the testis into the inguinal canal; states he had to manually compress the testis back into the hemiscrotum At the time of his ED visit his pain had significantly improved Scrotal sonogram was performed which showed normal-appearing testes bilaterally.  There was bilateral testicular blood flow however unexpected high resistance arterial waveform on the right raising the question of partial torsion He was discharged with precautions and urology follow-up recommended.  Was given Rx Naprosyn though he did not fill Since his ED visit he complains of moderate right testicular pain stating it feels like his testis is being squeezed. No scrotal swelling or redness Denies fever, chills or voiding problems   PMH: Past Medical History:  Diagnosis Date   Asthma    Chronic bronchitis (HCC)     Surgical History: Past Surgical History:  Procedure Laterality Date   TONSILLECTOMY      Home Medications:  Allergies as of 04/13/2021       Reactions   Codeine    Heart racing      Penicillins    Anaphylaxis         Medication List        Accurate as of April 13, 2021  1:37 PM. If you have any questions, ask your nurse or doctor.          naproxen 500 MG tablet Commonly known as: Naprosyn Take 1 tablet (500 mg total) by mouth 2 (two) times daily with a meal.        Allergies:  Allergies  Allergen Reactions   Codeine     Heart racing      Penicillins     Anaphylaxis     Family History: History reviewed. No pertinent family  history.  Social History:  reports that he has been smoking cigarettes. He has been smoking an average of 0.50 packs per day. He has never used smokeless tobacco. He reports current alcohol use. He reports that he does not use drugs.   Physical Exam: BP 123/78   Pulse 88   Ht 5\' 6"  (1.676 m)   Wt 160 lb (72.6 kg)   BMI 25.82 kg/m   Constitutional:  Alert and oriented, No acute distress. HEENT: [place] AT, moist mucus membranes.  Trachea midline, no masses. Cardiovascular: No clubbing, cyanosis, or edema. Respiratory: Normal respiratory effort, no increased work of breathing. GI: Abdomen is soft, nontender, nondistended, no abdominal masses GU: Phallus without lesions.  Scrotum normal in appearance without edema, erythema or ecchymoses.  Testes with normal orientation and lie; normal bilateral cremasteric reflexes.  Left testis/core palpably normal.  Right testis with mild tenderness.  Epididymis and cord palpably normal. Skin: No rashes, bruises or suspicious lesions. Neurologic: Grossly intact, no focal deficits, moving all 4 extremities. Psychiatric: Normal mood and affect.   Pertinent Imaging: Scrotal ultrasound images were personally reviewed and interpreted  Assessment & Plan:   Follow-up scrotal ultrasound with Doppler was ordered and the testes are normal bilaterally with normal blood flow.  The previously demonstrated high resistance flow in the right  has resolved Current pain most likely secondary to contusion Recommended scrotal support Rx etodolac 400 mg twice daily sent to pharmacy Follow-up as needed for persistent pain    Riki Altes, MD  [facility] [address], [address] [place], [place] [address] [phone]

## 2021-06-29 ENCOUNTER — Emergency Department (HOSPITAL_COMMUNITY)
Admission: EM | Admit: 2021-06-29 | Discharge: 2021-06-29 | Disposition: A | Discharge status: Home / Self Care | Payer: Self-pay | Attending: Emergency Medicine | Admitting: Emergency Medicine

## 2021-06-29 ENCOUNTER — Encounter (HOSPITAL_COMMUNITY): Payer: Self-pay

## 2021-06-29 ENCOUNTER — Emergency Department (HOSPITAL_COMMUNITY): Payer: Self-pay

## 2021-06-29 ENCOUNTER — Other Ambulatory Visit: Payer: Self-pay

## 2021-06-29 DIAGNOSIS — Z20822 Contact with and (suspected) exposure to covid-19: Secondary | ICD-10-CM | POA: Insufficient documentation

## 2021-06-29 DIAGNOSIS — F17210 Nicotine dependence, cigarettes, uncomplicated: Secondary | ICD-10-CM | POA: Insufficient documentation

## 2021-06-29 DIAGNOSIS — J45901 Unspecified asthma with (acute) exacerbation: Secondary | ICD-10-CM | POA: Insufficient documentation

## 2021-06-29 LAB — RESP PANEL BY RT-PCR (FLU A&B, COVID) ARPGX2
Influenza A by PCR: NEGATIVE
Influenza B by PCR: NEGATIVE
SARS Coronavirus 2 by RT PCR: NEGATIVE

## 2021-06-29 MED ORDER — ALBUTEROL SULFATE HFA 108 (90 BASE) MCG/ACT IN AERS: 1.0000 | INHALATION_SPRAY | Freq: Four times a day (QID) | RESPIRATORY_TRACT | 0 refills | Status: AC | PRN

## 2021-06-29 MED ORDER — PREDNISONE 20 MG PO TABS: 40.0000 mg | ORAL_TABLET | Freq: Every day | ORAL | 0 refills | Status: AC

## 2021-06-29 MED ORDER — ALBUTEROL SULFATE (2.5 MG/3ML) 0.083% IN NEBU
2.5000 mg | INHALATION_SOLUTION | Freq: Once | RESPIRATORY_TRACT | Status: AC
  Administered 2021-06-29: 2.5 mg via RESPIRATORY_TRACT
  Filled 2021-06-29: qty 3

## 2021-06-29 MED ORDER — PREDNISONE 20 MG PO TABS
60.0000 mg | ORAL_TABLET | Freq: Once | ORAL | Status: AC
  Administered 2021-06-29: 60 mg via ORAL
  Filled 2021-06-29: qty 3

## 2021-06-29 NOTE — ED Provider Notes (Signed)
[facility] EMERGENCY DEPARTMENT Provider Note   CSN: [id number] Arrival date & time: 06/29/21  7322     History Chief Complaint  Patient presents with   Cough   Shortness of Breath    David Salinas is a 30 y.o. male.  Patient is a 30 year old male with a past medical history of asthma that is presenting for a nonproductive cough for the last 3 days.  He complains of shortness of breath with this cough.  Patient denies any chest pain, fevers, chills, congestion.  Patient denies any pleuritic chest pain.  Patient has no shortness of breath while laying down or with ambulation.  Patient states that a few of his coworkers are sick at work.  Cough Cough characteristics:  Non-productive Severity:  Mild Duration:  3 days Smoker: yes   Relieved by:  None tried Associated symptoms: shortness of breath and wheezing   Associated symptoms: no chest pain, no chills, no diaphoresis, no ear fullness, no ear pain, no eye discharge, no fever, no headaches, no myalgias, no rash, no rhinorrhea, no sinus congestion, no sore throat and no weight loss   Shortness of Breath Associated symptoms: cough and wheezing   Associated symptoms: no abdominal pain, no chest pain, no diaphoresis, no ear pain, no fever, no headaches, no neck pain, no rash, no sore throat and no vomiting       Past Medical History:  Diagnosis Date   Asthma    Chronic bronchitis (HCC)     There are no problems to display for this patient.   Past Surgical History:  Procedure Laterality Date   TONSILLECTOMY         History reviewed. No pertinent family history.  Social History   Tobacco Use   Smoking status: Every Day    Packs/day: 0.50    Types: Cigarettes   Smokeless tobacco: Never  Vaping Use   Vaping Use: Never used  Substance Use Topics   Alcohol use: Yes    Comment: once weekly    Drug use: No    Home Medications Prior to Admission medications   Medication Sig Start Date End  Date Taking? Authorizing Provider  albuterol (VENTOLIN HFA) 108 (90 Base) MCG/ACT inhaler Inhale 1-2 puffs into the lungs every 6 (six) hours as needed for wheezing or shortness of breath. 06/29/21  Yes Lottie Dawson, MD  predniSONE (DELTASONE) 20 MG tablet Take 2 tablets (40 mg total) by mouth daily with breakfast for 5 days. 06/29/21 07/04/21 Yes Lottie Dawson, MD  pantoprazole (PROTONIX) 40 MG tablet Take 1 tablet (40 mg total) by mouth daily. 11/21/17 04/11/21  Emily Filbert, MD  promethazine (PHENERGAN) 25 MG tablet Take 1 tablet (25 mg total) by mouth every 4 (four) hours as needed for nausea or vomiting. Patient not taking: Reported on 04/11/2021 11/21/17 04/11/21  Emily Filbert, MD    Allergies    Codeine and Penicillins  Review of Systems   Review of Systems  Constitutional:  Negative for chills, diaphoresis, fatigue, fever and weight loss.  HENT:  Negative for congestion, dental problem, ear pain, facial swelling, hearing loss, nosebleeds, postnasal drip, rhinorrhea, sore throat and trouble swallowing.   Eyes:  Negative for photophobia, pain, discharge and visual disturbance.  Respiratory:  Positive for cough, shortness of breath and wheezing. Negative for apnea, choking, chest tightness and stridor.   Cardiovascular:  Negative for chest pain, palpitations and leg swelling.  Gastrointestinal:  Negative for abdominal distention, abdominal pain, constipation, diarrhea,  nausea and vomiting.  Endocrine: Negative for polydipsia and polyuria.  Genitourinary:  Negative for difficulty urinating, dysuria, flank pain, frequency, hematuria and urgency.  Musculoskeletal:  Negative for gait problem, myalgias, neck pain and neck stiffness.  Skin:  Negative for rash and wound.  Allergic/Immunologic: Negative for environmental allergies and food allergies.  Neurological:  Negative for dizziness, tremors, seizures, syncope, facial asymmetry, speech difficulty, light-headedness, numbness and  headaches.  Psychiatric/Behavioral:  Negative for behavioral problems and confusion.   All other systems reviewed and are negative.  Physical Exam Updated Vital Signs BP 132/70   Pulse 78   Temp 98.2 F (36.8 C)   Resp 18   SpO2 99%   Physical Exam Vitals and nursing note reviewed.  Constitutional:      General: He is not in acute distress.    Appearance: Normal appearance. He is normal weight.  HENT:     Head: Normocephalic and atraumatic.     Right Ear: External ear normal.     Left Ear: External ear normal.     Nose: Nose normal. No congestion.     Mouth/Throat:     Mouth: Mucous membranes are moist.     Pharynx: Oropharynx is clear. No oropharyngeal exudate or posterior oropharyngeal erythema.  Eyes:     General: No visual field deficit.    Extraocular Movements: Extraocular movements intact.     Conjunctiva/sclera: Conjunctivae normal.     Pupils: Pupils are equal, round, and reactive to light.  Cardiovascular:     Rate and Rhythm: Normal rate and regular rhythm.     Pulses: Normal pulses.     Heart sounds: Normal heart sounds. No murmur heard.   No friction rub. No gallop.  Pulmonary:     Effort: Pulmonary effort is normal. No respiratory distress.     Breath sounds: No stridor. Wheezing present. No rhonchi or rales.  Chest:     Chest wall: No tenderness.  Abdominal:     General: Abdomen is flat. Bowel sounds are normal. There is no distension.     Palpations: Abdomen is soft.     Tenderness: There is no abdominal tenderness. There is no right CVA tenderness, left CVA tenderness, guarding or rebound.  Musculoskeletal:        General: No swelling or tenderness. Normal range of motion.     Cervical back: Normal range of motion and neck supple. No rigidity, tenderness or bony tenderness.     Thoracic back: Normal. No tenderness or bony tenderness.     Lumbar back: Normal. No tenderness or bony tenderness.     Right lower leg: No edema.     Left lower leg: No  edema.  Skin:    General: Skin is warm and dry.  Neurological:     General: No focal deficit present.     Mental Status: He is alert and oriented to person, place, and time. Mental status is at baseline.     Cranial Nerves: Cranial nerves are intact. No cranial nerve deficit, dysarthria or facial asymmetry.     Sensory: Sensation is intact. No sensory deficit.     Motor: Motor function is intact. No weakness.     Coordination: Coordination is intact. Finger-Nose-Finger Test normal.     Gait: Gait is intact. Gait normal.  Psychiatric:        Mood and Affect: Mood normal.        Behavior: Behavior normal.        Thought Content: Thought content  normal.        Judgment: Judgment normal.    ED Results / Procedures / Treatments   Labs (all labs ordered are listed, but only abnormal results are displayed) Labs Reviewed  RESP PANEL BY RT-PCR (FLU A&B, COVID) ARPGX2    EKG None  Radiology DG Chest 2 View  Result Date: 06/29/2021 CLINICAL DATA:  Cough, dyspnea. EXAM: CHEST - 2 VIEW COMPARISON:  February 04, 2019. FINDINGS: The heart size and mediastinal contours are within normal limits. Both lungs are clear. The visualized skeletal structures are unremarkable. IMPRESSION: No active cardiopulmonary disease. Electronically Signed   By: Lupita Raider M.D.   On: 06/29/2021 09:54    Procedures Procedures   Medications Ordered in ED Medications  albuterol (PROVENTIL) (2.5 MG/3ML) 0.083% nebulizer solution 2.5 mg (2.5 mg Nebulization Given 06/29/21 0946)  predniSONE (DELTASONE) tablet 60 mg (60 mg Oral Given 06/29/21 0946)    ED Course  I have reviewed the triage vital signs and the nursing notes.  Pertinent labs & imaging results that were available during my care of the patient were reviewed by me and considered in my medical decision making (see chart for details).    MDM Rules/Calculators/A&P                         David Salinas is a 30 y.o. male with a past medical history of  asthma that is presenting for a nonproductive cough for the last 3 days.  Patient is hemodynamically stable and in no acute distress.  Patient is not tachypneic nor hypoxic on room air.  Patient states that he has a history of asthma and this feels similar to prior asthma exacerbations.  He complains of a nonproductive cough and shortness of breath.  He denies any fevers, chest pain.  He denies any pleuritic chest pain.  Patient states his last asthma exacerbation was 5 years ago.  Patient's physical exam is notable for wheezing on lung auscultation.  His chest x-ray showed no acute cardiac or pulmonary abnormality.  Patient's COVID test was negative.  Patient was given albuterol treatment with improvement of symptoms.  He is feeling better. Patient was given prednisone 60 mg in the ED.  Patient will be discharged home with 5 days of prednisone and an albuterol inhaler. He will follow up with his PCP.   Patient states compliance and understanding of the plan. I explained labs and imaging to the patient. No further questions at this time from the patient.  The patient is safe and stable for discharge at this time with return precautions provided and a plan for follow-up care in place as needed  The plan for this patient was discussed with Dr. Jodi Mourning, who voiced agreement and who oversaw evaluation and treatment of this patient.   Final Clinical Impression(s) / ED Diagnoses Final diagnoses:  Mild asthma with exacerbation, unspecified whether persistent    Rx / DC Orders ED Discharge Orders          Ordered    albuterol (VENTOLIN HFA) 108 (90 Base) MCG/ACT inhaler  Every 6 hours PRN        06/29/21 1114    predniSONE (DELTASONE) 20 MG tablet  Daily with breakfast        06/29/21 1114             Lottie Dawson, MD 06/29/21 2007    Blane Ohara, MD 06/30/21 2354

## 2021-06-29 NOTE — Discharge Instructions (Addendum)
Please take prednisone daily for the next 5 days.  Please use your albuterol inhaler as needed.  Please follow-up with your PCP.

## 2021-06-29 NOTE — ED Triage Notes (Addendum)
Pt from home c/o dry cough and SHOB for past 3 days. Pt states SHOB is worse with laying on back or with increased activity. Denies any COVID exposure or fevers. Pt reports having some pressure in chest however denies CP.

## 2021-06-29 NOTE — ED Notes (Signed)
Pt in XR. 

## 2021-10-08 ENCOUNTER — Emergency Department
Admission: EM | Admit: 2021-10-08 | Discharge: 2021-10-08 | Disposition: A | Discharge status: Home / Self Care | Payer: Self-pay | Attending: Emergency Medicine | Admitting: Emergency Medicine

## 2021-10-08 ENCOUNTER — Other Ambulatory Visit: Payer: Self-pay

## 2021-10-08 ENCOUNTER — Emergency Department: Payer: Self-pay

## 2021-10-08 DIAGNOSIS — J069 Acute upper respiratory infection, unspecified: Secondary | ICD-10-CM | POA: Insufficient documentation

## 2021-10-08 DIAGNOSIS — F17210 Nicotine dependence, cigarettes, uncomplicated: Secondary | ICD-10-CM | POA: Insufficient documentation

## 2021-10-08 DIAGNOSIS — Z20822 Contact with and (suspected) exposure to covid-19: Secondary | ICD-10-CM | POA: Insufficient documentation

## 2021-10-08 DIAGNOSIS — J40 Bronchitis, not specified as acute or chronic: Secondary | ICD-10-CM | POA: Insufficient documentation

## 2021-10-08 LAB — RESP PANEL BY RT-PCR (FLU A&B, COVID) ARPGX2
Influenza A by PCR: NEGATIVE
Influenza B by PCR: NEGATIVE
SARS Coronavirus 2 by RT PCR: NEGATIVE

## 2021-10-08 NOTE — ED Notes (Signed)
Pt to ED for new cough and congestion. Pt states his cough started about 3 days ago and has had head congestion for 2 weeks. Pt states he also has chills, and body aches for a week. Denies recent fevers, Denies NVD.  Pt took OTC mucinex with minimal relief.  Pt is A&Ox4, NAD

## 2021-10-08 NOTE — ED Triage Notes (Signed)
Pt in with co cough and congestion for 3 days, no fever.

## 2021-10-08 NOTE — Discharge Instructions (Signed)
No signs of pneumonia on the x-ray.  No current need for antibiotics.  Please check your MyChart results later this afternoon for your COVID and influenza test results.  If you develop any severely worsening symptoms, high fevers, passing out or chest pain, please return to the ED.

## 2021-10-08 NOTE — ED Provider Notes (Signed)
[facility] Emergency Department Provider Note ____________________________________________   Event Date/Time   First MD Initiated Contact with Patient 10/08/21 1532     (approximate)  I have reviewed the triage vital signs and the nursing notes.  HISTORY  Chief Complaint Cough   HPI David Salinas is a 30 y.o. Bonnita Nasuti presents to the ED for evaluation of cough and congestion.  Chart review indicates obese patient with history of mild intermittent asthma.  Patient presents to the ED for evaluation of cough and congestion for the past few days.  Reports was having upper respiratory congestion last week, but progressing to postnasal drip, cough and congestion over the past 2-3 days.  Denies fever, chest pain.  Reports the cough is sometimes productive of thick sputum.  Reports using his albuterol at home intermittently with minimal improvement of his symptoms.  Denies wheezing.  Denies chest pain, syncope, abdominal pain or emesis, but does report a poor appetite. Reports a couple episodes of pink streaking with his sputum from coughing, due to this he presents to the ED for eval.  Past Medical History:  Diagnosis Date   Asthma    Chronic bronchitis (HCC)     There are no problems to display for this patient.   Past Surgical History:  Procedure Laterality Date   TONSILLECTOMY      Prior to Admission medications   Medication Sig Start Date End Date Taking? Authorizing Provider  albuterol (VENTOLIN HFA) 108 (90 Base) MCG/ACT inhaler Inhale 1-2 puffs into the lungs every 6 (six) hours as needed for wheezing or shortness of breath. 06/29/21   Lottie Dawson, MD  pantoprazole (PROTONIX) 40 MG tablet Take 1 tablet (40 mg total) by mouth daily. 11/21/17 04/11/21  Emily Filbert, MD  promethazine (PHENERGAN) 25 MG tablet Take 1 tablet (25 mg total) by mouth every 4 (four) hours as needed for nausea or vomiting. Patient not taking: Reported on 04/11/2021  11/21/17 04/11/21  Emily Filbert, MD    Allergies Codeine and Penicillins  No family history on file.  Social History Social History   Tobacco Use   Smoking status: Every Day    Packs/day: 0.50    Types: Cigarettes   Smokeless tobacco: Never  Vaping Use   Vaping Use: Never used  Substance Use Topics   Alcohol use: Yes    Comment: once weekly    Drug use: No    Review of Systems  Constitutional: No fever/chills Eyes: No visual changes. ENT: No sore throat.  Positive for congestion Cardiovascular: Denies chest pain. Respiratory: Denies shortness of breath.  Positive for cough Gastrointestinal: No abdominal pain.  No nausea, no vomiting.  No diarrhea.  No constipation.  Positive for poor appetite Genitourinary: Negative for dysuria. Musculoskeletal: Negative for back pain. Skin: Negative for rash. Neurological: Negative for headaches, focal weakness or numbness.   ____________________________________________   PHYSICAL EXAM:  VITAL SIGNS: Vitals:   10/08/21 1452 10/08/21 1454  BP:  123/89  Pulse:  95  Resp:  20  Temp: 98.7 F (37.1 C) 98.7 F (37.1 C)  SpO2:  96%    Constitutional: Alert and oriented. Well appearing and in no acute distress. Eyes: Conjunctivae are normal. PERRL. EOMI. Head: Atraumatic. Nose: No congestion/rhinnorhea. Mouth/Throat: Mucous membranes are moist.  Oropharynx non-erythematous. Neck: No stridor. No cervical spine tenderness to palpation. Cardiovascular: Normal rate, regular rhythm. Grossly normal heart sounds.  Good peripheral circulation. Respiratory: Normal respiratory effort.  No retractions. Lungs CTAB.  No  wheezing, good air movement throughout. Gastrointestinal: Soft , nondistended, nontender to palpation. No CVA tenderness. Musculoskeletal: No lower extremity tenderness nor edema.  No joint effusions. No signs of acute trauma. Neurologic:  Normal speech and language. No gross focal neurologic deficits are appreciated.  No gait instability noted. Skin:  Skin is warm, dry and intact. No rash noted. Psychiatric: Mood and affect are normal. Speech and behavior are normal. ____________________________________________   LABS (all labs ordered are listed, but only abnormal results are displayed)  Labs Reviewed  RESP PANEL BY RT-PCR (FLU A&B, COVID) ARPGX2   ____________________________________________  12 Lead EKG   ____________________________________________  RADIOLOGY  ED MD interpretation:  CXR reviewed by me without evidence of acute cardiopulmonary pathology.  Official radiology report(s): DG Chest 2 View  Result Date: 10/08/2021 CLINICAL DATA:  Cough and congestion. EXAM: CHEST - 2 VIEW COMPARISON:  June 29, 2021 FINDINGS: The heart size and mediastinal contours are within normal limits. Both lungs are clear. The visualized skeletal structures are unremarkable. IMPRESSION: No active cardiopulmonary disease. Electronically Signed   By: Aram Candela M.D.   On: 10/08/2021 15:38    ____________________________________________   PROCEDURES and INTERVENTIONS  Procedure(s) performed (including Critical Care):  Procedures  Medications - No data to display  ____________________________________________   MDM / ED COURSE   30 year old male present to the ED with evidence of viral bronchitis amenable to outpatient management.  No evidence of pneumonia, sepsis or systemic illness.  No wheezing to suggest significant asthma exacerbation to warrant steroids.  COVID/influenza swab is pending and we discussed MyChart access to evaluate for this.  We discussed OTC medications to manage his symptoms and return precautions for the ED.     ____________________________________________   FINAL CLINICAL IMPRESSION(S) / ED DIAGNOSES  Final diagnoses:  Viral URI with cough  Bronchitis     ED Discharge Orders     None        Zanobia Griebel   Note:  This document was prepared using  Dragon voice recognition software and may include unintentional dictation errors.    Delton Prairie, MD 10/08/21 [phone]

## 2022-03-20 ENCOUNTER — Ambulatory Visit: Payer: Self-pay

## 2022-03-26 ENCOUNTER — Ambulatory Visit: Payer: Self-pay

## 2023-09-03 ENCOUNTER — Other Ambulatory Visit: Payer: Self-pay

## 2023-09-03 ENCOUNTER — Emergency Department
Admission: EM | Admit: 2023-09-03 | Discharge: 2023-09-03 | Disposition: A | Discharge status: Home / Self Care | Payer: Self-pay | Attending: Emergency Medicine | Admitting: Emergency Medicine

## 2023-09-03 DIAGNOSIS — R7309 Other abnormal glucose: Secondary | ICD-10-CM | POA: Insufficient documentation

## 2023-09-03 DIAGNOSIS — F419 Anxiety disorder, unspecified: Secondary | ICD-10-CM

## 2023-09-03 LAB — CBG MONITORING, ED: Glucose-Capillary: 133 mg/dL — ABNORMAL HIGH (ref 70–99)

## 2023-09-03 MED ORDER — ACETAMINOPHEN 325 MG PO TABS
650.0000 mg | ORAL_TABLET | Freq: Once | ORAL | Status: AC
  Administered 2023-09-03: 650 mg via ORAL
  Filled 2023-09-03: qty 2

## 2023-09-03 NOTE — ED Provider Notes (Signed)
[facility] Provider Note    Event Date/Time   First MD Initiated Contact with Patient 09/03/23 1342     (approximate)   History   Headache   HPI  David Salinas is a 32 y.o. male with history of anxiety and panic attacks presents emergency department stating that his blood pressure and glucose were elevated at home this morning.  States he has been taking readings every morning for 3 months.  However he states he was very anxious and thinks he had a panic attack at the same time.  States everything is better now.  Denies chest pain, shortness of breath.      Physical Exam   Triage Vital Signs: ED Triage Vitals  Encounter Vitals Group     BP 09/03/23 1319 130/87     Systolic BP Percentile --      Diastolic BP Percentile --      Pulse Rate 09/03/23 1319 85     Resp 09/03/23 1319 17     Temp 09/03/23 1319 97.7 F (36.5 C)     Temp src --      SpO2 09/03/23 1320 100 %     Weight 09/03/23 1319 190 lb (86.2 kg)     Height 09/03/23 1319 5\' 6"  (1.676 m)     Head Circumference --      Peak Flow --      Pain Score 09/03/23 1318 4     Pain Loc --      Pain Education --      Exclude from Growth Chart --     Most recent vital signs: Vitals:   09/03/23 1319 09/03/23 1320  BP: 130/87   Pulse: 85   Resp: 17   Temp: 97.7 F (36.5 C)   SpO2:  100%     General: Awake, no distress.   CV:  Good peripheral perfusion. regular rate and  rhythm Resp:  Normal effort. Lungs CTA Abd:  No distention.   Other:      ED Results / Procedures / Treatments   Labs (all labs ordered are listed, but only abnormal results are displayed) Labs Reviewed  CBG MONITORING, ED - Abnormal; Notable for the following components:      Result Value   Glucose-Capillary 133 (*)    All other components within normal limits     EKG     RADIOLOGY     PROCEDURES:   Procedures   MEDICATIONS ORDERED IN ED: Medications  acetaminophen (TYLENOL) tablet 650  mg (650 mg Oral Given 09/03/23 1426)     IMPRESSION / MDM / ASSESSMENT AND PLAN / ED COURSE  I reviewed the triage vital signs and the nursing notes.                              Differential diagnosis includes, but is not limited to, anxiety, panic attack, hypertension, diabetes  Patient's presentation is most consistent with acute complicated illness / injury requiring diagnostic workup.   Fingerstick glucose in triage was 133, blood pressure appears to be normal at 130/87  Patient states he is feeling better.  Feels was mostly due to a panic attack.  He is to follow-up with [facility] for his scheduled group therapy.  Return emergency department worsening.  Patient is in agreement treatment plan.  Discharged stable condition.      FINAL CLINICAL IMPRESSION(S) / ED DIAGNOSES   Final diagnoses:  Anxiety     Rx / DC Orders   ED Discharge Orders     None        Note:  This document was prepared using Dragon voice recognition software and may include unintentional dictation errors.    Faythe Ghee, PA-C 09/03/23 1453    Jene Every, MD 09/03/23 [phone]

## 2023-09-03 NOTE — ED Triage Notes (Addendum)
Pt arrives via POV. Pt reports he has been keeping an eye on his BP and blood sugar daily at home for the past 3 months. States both have been going up and down. Pt is not diagnosed with diabetes or high blood pressure.  PT is AxOx4. Pt reports a headache and a "shaky" feeling during triage. BG in triage is 133. NAD. Denies any other complaints.

## 2023-09-10 ENCOUNTER — Telehealth: Payer: Self-pay | Admitting: Licensed Clinical Social Worker

## 2023-09-10 NOTE — Telephone Encounter (Signed)
Attempted call to patient to offer sooner appt. For 09/10/23 at 230pm vm is not set up and LCSW was not able to lvm.

## 2023-09-19 ENCOUNTER — Ambulatory Visit: Payer: Self-pay

## 2023-09-21 ENCOUNTER — Other Ambulatory Visit: Payer: Self-pay

## 2023-09-21 ENCOUNTER — Emergency Department
Admission: EM | Admit: 2023-09-21 | Discharge: 2023-09-21 | Disposition: A | Discharge status: Home / Self Care | Payer: Self-pay | Attending: Emergency Medicine | Admitting: Emergency Medicine

## 2023-09-21 ENCOUNTER — Encounter: Payer: Self-pay | Admitting: Intensive Care

## 2023-09-21 DIAGNOSIS — F410 Panic disorder [episodic paroxysmal anxiety] without agoraphobia: Secondary | ICD-10-CM | POA: Insufficient documentation

## 2023-09-21 DIAGNOSIS — R7309 Other abnormal glucose: Secondary | ICD-10-CM | POA: Insufficient documentation

## 2023-09-21 HISTORY — DX: Panic disorder (episodic paroxysmal anxiety): F41.0

## 2023-09-21 HISTORY — DX: Anxiety disorder, unspecified: F41.9

## 2023-09-21 LAB — COMPREHENSIVE METABOLIC PANEL
ALT: 43 U/L (ref 0–44)
AST: 30 U/L (ref 15–41)
Albumin: 4.6 g/dL (ref 3.5–5.0)
Alkaline Phosphatase: 57 U/L (ref 38–126)
Anion gap: 9 (ref 5–15)
BUN: 14 mg/dL (ref 6–20)
CO2: 23 mmol/L (ref 22–32)
Calcium: 9.1 mg/dL (ref 8.9–10.3)
Chloride: 102 mmol/L (ref 98–111)
Creatinine, Ser: 1.17 mg/dL (ref 0.61–1.24)
GFR, Estimated: >60 mL/min (ref >60)
Glucose, Bld: 177 mg/dL — ABNORMAL HIGH (ref 70–99)
Potassium: 3.9 mmol/L (ref 3.5–5.1)
Sodium: 134 mmol/L — ABNORMAL LOW (ref 135–145)
Total Bilirubin: 0.5 mg/dL (ref <1.2)
Total Protein: 8 g/dL (ref 6.5–8.1)

## 2023-09-21 LAB — CBC WITH DIFFERENTIAL/PLATELET
Abs Immature Granulocytes: 0.05 10*3/uL (ref 0.00–0.07)
Basophils Absolute: 0.1 10*3/uL (ref 0.0–0.1)
Basophils Relative: 1 %
Eosinophils Absolute: 0.2 10*3/uL (ref 0.0–0.5)
Eosinophils Relative: 1 %
HCT: 47.8 % (ref 39.0–52.0)
Hemoglobin: 16.5 g/dL (ref 13.0–17.0)
Immature Granulocytes: 0 %
Lymphocytes Relative: 14 %
Lymphs Abs: 2.1 10*3/uL (ref 0.7–4.0)
MCH: 29.7 pg (ref 26.0–34.0)
MCHC: 34.5 g/dL (ref 30.0–36.0)
MCV: 86 fL (ref 80.0–100.0)
Monocytes Absolute: 0.7 10*3/uL (ref 0.1–1.0)
Monocytes Relative: 5 %
Neutro Abs: 11.9 10*3/uL — ABNORMAL HIGH (ref 1.7–7.7)
Neutrophils Relative %: 79 %
Platelets: 402 10*3/uL — ABNORMAL HIGH (ref 150–400)
RBC: 5.56 MIL/uL (ref 4.22–5.81)
RDW: 12.6 % (ref 11.5–15.5)
WBC: 15 10*3/uL — ABNORMAL HIGH (ref 4.0–10.5)
nRBC: 0 % (ref 0.0–0.2)

## 2023-09-21 LAB — CBG MONITORING, ED: Glucose-Capillary: 174 mg/dL — ABNORMAL HIGH (ref 70–99)

## 2023-09-21 MED ORDER — HYDROXYZINE HCL 25 MG PO TABS
25.0000 mg | ORAL_TABLET | Freq: Once | ORAL | Status: AC
  Administered 2023-09-21: 25 mg via ORAL
  Filled 2023-09-21: qty 1

## 2023-09-21 MED ORDER — HYDROXYZINE HCL 25 MG PO TABS: 25.0000 mg | ORAL_TABLET | Freq: Three times a day (TID) | ORAL | 0 refills | Status: DC | PRN

## 2023-09-21 NOTE — ED Notes (Signed)
First nurse note:  Patient arrived via EMS for c/o anxiety. Patient has a history of anxiety. Patient states he started a new med 10 days ago.

## 2023-09-21 NOTE — ED Provider Triage Note (Signed)
Emergency Medicine Provider Triage Evaluation Note  David Salinas , a 32 y.o. male  was evaluated in triage.  Pt complains of anxiety attack. Reports BP was elevated at that time. He was sweaty and had a headache. Thinks he may be dehydrated. This has happened before, about 3 weeks. Takes Paxil and Hydroxyzine. Sees a psychiatrist.  Review of Systems  Positive: anxious Negative: SI/HI/hallucinations  Physical Exam  BP (!) 149/108 (BP Location: Left Arm)   Pulse 80   Temp 98.8 F (37.1 C)   Resp 17   SpO2 97%  Gen:   Awake, no distress   Resp:  Normal effort  MSK:   Moves extremities without difficulty  Other:    Medical Decision Making  Medically screening exam initiated at 5:59 PM.  Appropriate orders placed.  David Salinas was informed that the remainder of the evaluation will be completed by another provider, this initial triage assessment does not replace that evaluation, and the importance of remaining in the ED until their evaluation is complete.     Cameron Ali, PA-C 09/21/23 1800

## 2023-09-21 NOTE — ED Triage Notes (Signed)
Patient reports he had a panic attack today and called EMS. Reports he did not take his PRN medication when attack occurred. Reports feeling anxious now.  Patient calm during triage. Reports "I am probably dehydrated and my mouth is dry"

## 2023-09-21 NOTE — ED Provider Notes (Signed)
[facility] Provider Note    Event Date/Time   First MD Initiated Contact with Patient 09/21/23 2111     (approximate)   History   Chief Complaint Panic Attack   HPI  David Salinas is a 32 y.o. male with past medical history of anxiety who presents to the ED complaining of panic attacks.  Patient reports that he has been dealing with frequent panic attacks over the course of the day today.  He states that he will feel hot and flushed with sensation of his heart racing.  He denies any associated chest pain or shortness of breath.  He states he has been following with his psychiatrist for this problem, has been prescribed an SSRI as well as 10 mg Atarax.  He has been taking these medications without relief.  He denies any homicidal or suicidal ideation, has not had any auditory or visual hallucinations.     Physical Exam   Triage Vital Signs: ED Triage Vitals  Encounter Vitals Group     BP 09/21/23 1758 (!) 149/108     Systolic BP Percentile --      Diastolic BP Percentile --      Pulse Rate 09/21/23 1758 80     Resp 09/21/23 1758 17     Temp 09/21/23 1758 98.8 F (37.1 C)     Temp Source 09/21/23 2115 Oral     SpO2 09/21/23 1758 97 %     Weight 09/21/23 1813 190 lb (86.2 kg)     Height 09/21/23 1813 5\' 6"  (1.676 m)     Head Circumference --      Peak Flow --      Pain Score 09/21/23 1813 0     Pain Loc --      Pain Education --      Exclude from Growth Chart --     Most recent vital signs: Vitals:   09/21/23 2115 09/21/23 2341  BP: (!) 131/90 (!) 135/93  Pulse: 89 84  Resp: 18 16  Temp: 97.9 F (36.6 C)   SpO2: 96% 97%    Constitutional: Alert and oriented. Eyes: Conjunctivae are normal. Head: Atraumatic. Nose: No congestion/rhinnorhea. Mouth/Throat: Mucous membranes are moist.  Cardiovascular: Normal rate, regular rhythm. Grossly normal heart sounds.  2+ radial pulses bilaterally. Respiratory: Normal respiratory effort.  No  retractions. Lungs CTAB. Gastrointestinal: Soft and nontender. No distention. Musculoskeletal: No lower extremity tenderness nor edema.  Neurologic:  Normal speech and language. No gross focal neurologic deficits are appreciated.    ED Results / Procedures / Treatments   Labs (all labs ordered are listed, but only abnormal results are displayed) Labs Reviewed  CBC WITH DIFFERENTIAL/PLATELET - Abnormal; Notable for the following components:      Result Value   WBC 15.0 (*)    Platelets 402 (*)    Neutro Abs 11.9 (*)    All other components within normal limits  COMPREHENSIVE METABOLIC PANEL - Abnormal; Notable for the following components:   Sodium 134 (*)    Glucose, Bld 177 (*)    All other components within normal limits  CBG MONITORING, ED - Abnormal; Notable for the following components:   Glucose-Capillary 174 (*)    All other components within normal limits   PROCEDURES:  Critical Care performed: No  Procedures   MEDICATIONS ORDERED IN ED: Medications  hydrOXYzine (ATARAX) tablet 25 mg (25 mg Oral Given 09/21/23 2129)     IMPRESSION / MDM / ASSESSMENT AND  PLAN / ED COURSE  I reviewed the triage vital signs and the nursing notes.                              32 y.o. male with past medical history of anxiety who presents to the ED complaining of intermittent panic attacks over the course of the day today.  Patient's presentation is most consistent with severe exacerbation of chronic illness.  Differential diagnosis includes, but is not limited to, panic attacks, arrhythmia, anemia, electrolyte abnormality.  Patient well-appearing and in no acute distress, vital signs are unremarkable.  EKG shows no evidence of arrhythmia or ischemia, labs are reassuring with no significant anemia, electrolyte abnormality, or AKI.  He does have a mild leukocytosis but no symptoms to suggest infectious process.  LFTs are unremarkable.  Patient asymptomatic at the time of my  assessment, we will increase his dose of Atarax and have him follow-up with his psychiatrist.  He was counseled to return to the ED for new or worsening symptoms, patient agrees with plan.      FINAL CLINICAL IMPRESSION(S) / ED DIAGNOSES   Final diagnoses:  Panic attacks     Rx / DC Orders   ED Discharge Orders          Ordered    hydrOXYzine (ATARAX) 25 MG tablet  3 times daily PRN,   Status:  Discontinued        09/21/23 2313    hydrOXYzine (ATARAX) 25 MG tablet  3 times daily PRN        09/21/23 2350             Note:  This document was prepared using Dragon voice recognition software and may include unintentional dictation errors.   Chesley Noon, MD 09/21/23 [phone]

## 2023-09-23 ENCOUNTER — Emergency Department
Admission: EM | Admit: 2023-09-23 | Discharge: 2023-09-24 | Disposition: A | Discharge status: Other Acute Inpatient Hospital | Payer: 59 | Attending: Emergency Medicine | Admitting: Emergency Medicine

## 2023-09-23 ENCOUNTER — Other Ambulatory Visit: Payer: Self-pay

## 2023-09-23 DIAGNOSIS — F419 Anxiety disorder, unspecified: Secondary | ICD-10-CM

## 2023-09-23 DIAGNOSIS — F410 Panic disorder [episodic paroxysmal anxiety] without agoraphobia: Secondary | ICD-10-CM | POA: Diagnosis not present

## 2023-09-23 DIAGNOSIS — F411 Generalized anxiety disorder: Secondary | ICD-10-CM | POA: Insufficient documentation

## 2023-09-23 DIAGNOSIS — R45851 Suicidal ideations: Secondary | ICD-10-CM

## 2023-09-23 DIAGNOSIS — F323 Major depressive disorder, single episode, severe with psychotic features: Secondary | ICD-10-CM | POA: Insufficient documentation

## 2023-09-23 DIAGNOSIS — F332 Major depressive disorder, recurrent severe without psychotic features: Secondary | ICD-10-CM | POA: Diagnosis not present

## 2023-09-23 DIAGNOSIS — F329 Major depressive disorder, single episode, unspecified: Secondary | ICD-10-CM

## 2023-09-23 DIAGNOSIS — R440 Auditory hallucinations: Secondary | ICD-10-CM | POA: Insufficient documentation

## 2023-09-23 DIAGNOSIS — Z79899 Other long term (current) drug therapy: Secondary | ICD-10-CM | POA: Insufficient documentation

## 2023-09-23 MED ORDER — HYDROXYZINE HCL 25 MG PO TABS
25.0000 mg | ORAL_TABLET | Freq: Once | ORAL | Status: AC
  Administered 2023-09-23: 25 mg via ORAL
  Filled 2023-09-23: qty 1

## 2023-09-23 MED ORDER — HYDROXYZINE HCL 25 MG PO TABS
50.0000 mg | ORAL_TABLET | Freq: Three times a day (TID) | ORAL | Status: DC | PRN
  Administered 2023-09-23 – 2023-09-24 (×2): 50 mg via ORAL
  Filled 2023-09-23 (×2): qty 2

## 2023-09-23 MED ORDER — TRAZODONE HCL 100 MG PO TABS
100.0000 mg | ORAL_TABLET | Freq: Every evening | ORAL | Status: DC | PRN
  Administered 2023-09-23: 100 mg via ORAL
  Filled 2023-09-23: qty 1

## 2023-09-23 NOTE — ED Notes (Signed)
Pt stating he is on the verge of a "panic attack" and was requesting more hydroxyzine

## 2023-09-23 NOTE — ED Notes (Addendum)
Pt  had been informed about 20 minutes ago by EDP that he was going to be discharged and at that time he told EDP Quale "I'd like to stay here a little longer".   Pt now telling this RN that he has vague suicidal ideation with no plan but would like to talk to the EDP about getting "mental help". Pt also requesting food at this time.   EDP Quale informed. CN made aware.

## 2023-09-23 NOTE — ED Provider Notes (Signed)
-----------------------------------------   10:38 PM on 09/23/2023 ----------------------------------------- Psychiatry is seen and evaluated the patient they recommending inpatient admission.  Patient remains here voluntarily at this time.   Minna Antis, MD 09/23/23 2238

## 2023-09-23 NOTE — Consult Note (Signed)
[facility] Psych ED Progress Note  09/24/2023 5:56 AM David Salinas  MRN:  [id number]   Method of visit?: Virtual (Location of provider: [facility] Location of patient: [facility] ED Interview Room Names and roles of anyone participating in the consult/assessment: Bishop Limbo PMHNP, Robinette Haines LCAS This service was provided via telemedicine using a 2-way, interactive audio, and video technology. )  Subjective:  "I'm really depressed"  Diagnosis:  Active Problems:   Suicidal ideation   Auditory hallucinations   Major depressive disorder, single episode, severe with psychotic features (HCC)   Generalized anxiety disorder with panic attacks  David Salinas 32 y.o., male patient with a past psychiatric history of anxiety who presented to [facility] Emergency Department voluntarily as a walk-in with initial reports of anxiety and his heart racing.  He was evaluated by the ED physician and had been informed he would be discharged, at that time David Salinas shared that he was experiencing suicidal ideation.  On my evaluation David Salinas reports increased anxiety over the past month and that he was seen by his psychiatrist at [facility] approximately 2 weeks ago for the same.  He is taking hydroxyzine and Paxil and reports that Paxil is not effective but hydroxyzine does help with anxiety symptoms.  During my evaluation David Salinas reports active suicidal ideation with a plan to hang himself.  He endorses a prior suicide attempt approximately 17 years ago. David Salinas endorses auditory hallucinations on exam and states that the voices tell him to harm himself.  He also endorses being paranoid at times and feeling as though the world is working against him.  He endorses feelings of depression and hopelessness, difficulty sleeping, feeling tired, feeling as though he is a failure, and difficulty concentrating.  He reports fair sleep and that he gets approximately 5 hours per night.  He has not noticed a change  in his appetite. He reports for the past 2 weeks he has been living at his grandparents residence.  He reports that he has not been kicked out of his own residence and still retains that residence. David Salinas reports currently being unemployed stating that he has difficulty keeping jobs secondary to depressive symptoms. 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  He reports he lost that job approximately 1.5 months ago.  David Salinas denies any current substance use to include illicit drugs, EtOH, tobacco products, or vaping.  Support encouragement and reassurance provided about ongoing stressors, patient provided with opportunity for questions.  Discussed recommendation for inpatient psychiatric admission for stabilization and treatment. Discussed milieu and expectations. Patient is in agreement.  Suicide risk factors: Financial strain, Depression, Anxiety, Previous suicide attempt Suicide protective factors: Family support, access to mental health care    Total Time spent with patient: 20 minutes  Past Psychiatric History: Generalized Anxiety Disorder  Past Medical History:  Past Medical History:  Diagnosis Date   Anxiety    Asthma    Chronic bronchitis (HCC)    Panic attacks     Past Surgical History:  Procedure Laterality Date   TONSILLECTOMY     Family History: History reviewed. No pertinent family history.  Family Psychiatric  History: No pertinent family history.  Social History:  Social History   Substance and Sexual Activity  Alcohol Use Not Currently     Social History   Substance and Sexual Activity  Drug Use No    Social History   Socioeconomic History   Marital status: Legally Separated  Spouse name: Not on file   Number of children: Not on file   Years of education: Not on file   Highest education level: Not on file  Occupational History   Not on file  Tobacco Use   Smoking status: Former    Current packs/day: 0.50    Types:  Cigarettes   Smokeless tobacco: Never  Vaping Use   Vaping status: Never Used  Substance and Sexual Activity   Alcohol use: Not Currently   Drug use: No   Sexual activity: Not on file  Other Topics Concern   Not on file  Social History Narrative   Not on file   Social Determinants of Health   Financial Resource Strain: Not on file  Food Insecurity: Not on file  Transportation Needs: Not on file  Physical Activity: Not on file  Stress: Not on file  Social Connections: Not on file    Sleep: Fair  Appetite:  Good  Current Medications: Current Facility-Administered Medications  Medication Dose Route Frequency Provider Last Rate Last Admin   hydrOXYzine (ATARAX) tablet 50 mg  50 mg Oral TID PRN Tyrease Vandeberg L, NP   50 mg at 09/23/23 2348   traZODone (DESYREL) tablet 100 mg  100 mg Oral QHS PRN Janella Rogala L, NP   100 mg at 09/23/23 2348   Current Outpatient Medications  Medication Sig Dispense Refill   albuterol (VENTOLIN HFA) 108 (90 Base) MCG/ACT inhaler Inhale 1-2 puffs into the lungs every 6 (six) hours as needed for wheezing or shortness of breath. 1 each 0   hydrOXYzine (ATARAX) 25 MG tablet Take 1 tablet (25 mg total) by mouth 3 (three) times daily as needed. 30 tablet 0    Lab Results: No results found for this or any previous visit (from the past 48 hour(s)).  Blood Alcohol level:  No results found for: "ETH"  Musculoskeletal: Strength & Muscle Tone: within normal limits Gait & Station: normal Patient leans: N/A  Psychiatric Specialty Exam:  Presentation  General Appearance: Casual  Eye Contact:Fair  Speech:Clear and Coherent  Speech Volume:Decreased  Handedness:Right   Mood and Affect  Mood:Depressed; Anxious  Affect:Congruent   Thought Process  Thought Processes:Coherent  Descriptions of Associations:Intact  Orientation:Full (Time, Place and Person)  Thought Content:Paranoid Ideation  History of Schizophrenia/Schizoaffective  disorder:No  Duration of Psychotic Symptoms:Less than six months  Hallucinations:Hallucinations: Auditory Description of Auditory Hallucinations: Patient states the voices tell him to harm himself  Ideas of Reference:None  Suicidal Thoughts:Suicidal Thoughts: Yes, Active SI Active Intent and/or Plan: With Plan  Homicidal Thoughts:Homicidal Thoughts: No   Sensorium  Memory:Immediate Fair; Recent Fair; Remote Fair  Judgment:Fair  Insight:Fair   Executive Functions  Concentration:Fair  Attention Span:Fair  Recall:Good  Fund of Knowledge:Good  Language:Good   Psychomotor Activity  Psychomotor Activity:Psychomotor Activity: Normal   Assets  Assets:Communication Skills; Desire for Improvement; Housing; Leisure Time   Sleep  Sleep:Sleep: Fair Number of Hours of Sleep: 5    Physical Exam: Physical Exam Vitals and nursing note reviewed.  HENT:     Head: Normocephalic.  Cardiovascular:     Rate and Rhythm: Normal rate.  Pulmonary:     Effort: Pulmonary effort is normal. No respiratory distress.     Breath sounds: No wheezing.  Musculoskeletal:        General: No swelling or deformity. Normal range of motion.  Neurological:     General: No focal deficit present.     Mental Status: He is alert and oriented  to person, place, and time.     Motor: No weakness.     Gait: Gait normal.  Psychiatric:        Mood and Affect: Mood is anxious and depressed.        Speech: Speech normal.        Behavior: Behavior normal.        Thought Content: Thought content is paranoid. Thought content includes suicidal ideation. Thought content includes suicidal plan.        Cognition and Memory: Cognition normal.    Review of Systems  Constitutional:  Negative for chills and fever.  Respiratory:  Negative for cough and wheezing.   Cardiovascular:  Negative for palpitations.  Gastrointestinal:  Negative for nausea and vomiting.  Skin:  Negative for rash.  Neurological:   Negative for focal weakness and weakness.  Psychiatric/Behavioral:  Positive for depression, hallucinations and suicidal ideas. Negative for memory loss and substance abuse. The patient is nervous/anxious. The patient does not have insomnia.    Blood pressure (!) 147/87, pulse 92, temperature 98.1 F (36.7 C), temperature source Oral, resp. rate 17, height 5\' 6"  (1.676 m), weight 86.2 kg, SpO2 97%. Body mass index is 30.67 kg/m.  Treatment Plan Summary: Daily contact with patient to assess and evaluate symptoms and progress in treatment  Patient meets criteria for psychiatric inpatient treatment due to suicidal ideation with a plan and depression with psychotic features. Patient requires inpatient treatment for acute crisis management and psychiatric stabilization.  LCSW will fax out if no bed availability within [facility].   This visit was coded based on time. I spent a total of 45 minutes in both face-to-face and non-face-to-face activities for this visit on the date of this encounter.   This document was prepared using Dragon voice recognition software and may include unintentional dictation errors.  Bishop Limbo DNP, MBA, PMHNP-BC, FNP-BC  Psychiatric Mental Health Nurse Practitioner [facility]

## 2023-09-23 NOTE — ED Provider Notes (Signed)
[facility] Provider Note    Event Date/Time   First MD Initiated Contact with Patient 09/23/23 1302     (approximate)   History   Tachycardia and Anxiety   HPI {Remember to add pertinent medical, surgical, social, and/or OB history to HPI:1} David Salinas is a 32 y.o. male  ***       Physical Exam   Triage Vital Signs: ED Triage Vitals  Encounter Vitals Group     BP 09/23/23 1310 134/83     Systolic BP Percentile --      Diastolic BP Percentile --      Pulse Rate 09/23/23 1310 83     Resp 09/23/23 1310 16     Temp 09/23/23 1356 98.4 F (36.9 C)     Temp Source 09/23/23 1356 Oral     SpO2 09/23/23 1310 98 %     Weight 09/23/23 1309 190 lb (86.2 kg)     Height 09/23/23 1309 5\' 6"  (1.676 m)     Head Circumference --      Peak Flow --      Pain Score 09/23/23 1309 0     Pain Loc --      Pain Education --      Exclude from Growth Chart --     Most recent vital signs: Vitals:   09/23/23 1310 09/23/23 1356  BP: 134/83   Pulse: 83   Resp: 16   Temp:  98.4 F (36.9 C)  SpO2: 98%     {Only need to document appropriate and relevant physical exam:1} General: Awake, no distress. *** CV:  Good peripheral perfusion. *** Resp:  Normal effort. *** Abd:  No distention. *** Other:  ***   ED Results / Procedures / Treatments   Labs (all labs ordered are listed, but only abnormal results are displayed) Labs Reviewed - No data to display   EKG  ***   RADIOLOGY *** {USE THE WORD "INTERPRETED"!! You MUST document your own interpretation of imaging, as well as the fact that you reviewed the radiologist's report!:1}   PROCEDURES:  Critical Care performed: {CriticalCareYesNo:19197::"Yes, see critical care procedure note(s)","No"}  Procedures   MEDICATIONS ORDERED IN ED: Medications - No data to display   IMPRESSION / MDM / ASSESSMENT AND PLAN / ED COURSE  I reviewed the triage vital signs and the nursing notes.                               Differential diagnosis includes, but is not limited to, ***  Patient's presentation is most consistent with {EM COPA:27473}  *** {If the patient is on the monitor, remove the brackets and asterisks on the sentence below and remember to document it as a Procedure as well. Otherwise delete the sentence below:1} {**The patient is on the cardiac monitor to evaluate for evidence of arrhythmia and/or significant heart rate changes.**} {Remember to include, when applicable, any/all of the following data: independent review of imaging independent review of labs (comment specifically on pertinent positives and negatives) review of specific prior hospitalizations, PCP/specialist notes, etc. discuss meds given and prescribed document any discussion with consultants (including hospitalists) any clinical decision tools you used and why (PECARN, NEXUS, etc.) did you consider admitting the patient? document social determinants of health affecting patient's care (homelessness, inability to follow up in a timely fashion, etc) document any pre-existing conditions increasing risk on current visit (e.g. diabetes and  HTN increasing danger of high-risk chest pain/ACS) describes what meds you gave (especially parenteral) and why any other interventions?:1}     FINAL CLINICAL IMPRESSION(S) / ED DIAGNOSES   Final diagnoses:  None     Rx / DC Orders   ED Discharge Orders     None        Note:  This document was prepared using Dragon voice recognition software and may include unintentional dictation errors.

## 2023-09-23 NOTE — ED Notes (Signed)
Pt resting, family member at bedside, atarax was ordered by EDP per pt request.

## 2023-09-23 NOTE — ED Notes (Signed)
Dr Fanny Bien was at bedside.

## 2023-09-23 NOTE — ED Notes (Signed)
Pt has family member at bedside. Pt was provided supper tray. VS and EKG being obtained by ED tech.

## 2023-09-23 NOTE — ED Triage Notes (Signed)
Pt to ED for anxiety and "heart racing". From home, [facility]  Recently started taking Paxil (since 2 weeks ago) for anxiety and since then has been feeling "heart racing"   Also takes hydroxyzine for anxiety  98% RA, 151/112, ST on 12 lead, HR 101  EMS staff noticed "hives" to neck. No hives noted in triage. Pt has redness to neck and chest and possibly some bumps from shaving. No splotches or raised hives  HR is 83 in triage

## 2023-09-23 NOTE — BH Assessment (Signed)
Comprehensive Clinical Assessment (CCA) Screening, Triage and Referral Note  09/23/2023 David Salinas [id number]  Chief Complaint:  Chief Complaint  Patient presents with   Tachycardia   Anxiety   Visit Diagnosis: Major Depression  David Salinas is a 32 year old male who initially for anxiety. However, he voiced having SI, with the plan of hanging his self. He states he is seen at [facility]) for medication management, but he isn't been treated for the depression, only for the anxiety. He states the medications aren't working and his thoughts of ending his life are causing him distress. He has his own place but currently lives with his grandparents. He has been there for two weeks.  Patient Reported Information How did you hear about [place]? Self  What Is the Reason for Your Visit/Call Today? Patient states he is wanting to end his life and have a plan to hang himself.  How Long Has This Been Causing You Problems? 1 wk - 1 month  What Do You Feel Would Help You the Most Today? Treatment for Depression or other mood problem   Have You Recently Had Any Thoughts About Hurting Yourself? Yes  Are You Planning to Commit Suicide/Harm Yourself At This time? Yes   Have you Recently Had Thoughts About Hurting Someone Karolee Ohs? No  Are You Planning to Harm Someone at This Time? No  Explanation: No data recorded  Have You Used Any Alcohol or Drugs in the Past 24 Hours? No  How Long Ago Did You Use Drugs or Alcohol? No data recorded What Did You Use and How Much? No data recorded  Do You Currently Have a Therapist/Psychiatrist? Yes  Name of Therapist/Psychiatrist: [facility]  Have You Been Recently Discharged From Any Office Practice or Programs? No  Explanation of Discharge From Practice/Program: No data recorded   CCA Screening Triage Referral Assessment Type of Contact: Face-to-Face  Telemedicine Service Delivery:   Is this Initial or Reassessment?   Date  Telepsych consult ordered in [facility]:    Time Telepsych consult ordered in [facility]:    Location of Assessment: [facility] ED  Provider Location: [facility] ED   Collateral Involvement: No data recorded  Does Patient Have a Court Appointed Legal Guardian? No data recorded Name and Contact of Legal Guardian: No data recorded If Minor and Not Living with Parent(s), Who has Custody? No data recorded Is CPS involved or ever been involved? Never  Is APS involved or ever been involved? Never  Patient Determined To Be At Risk for Harm To Self or Others Based on Review of Patient Reported Information or Presenting Complaint? Yes, for Self-Harm  Method: No data recorded Availability of Means: No data recorded Intent: No data recorded Notification Required: No data recorded Additional Information for Danger to Others Potential: No data recorded Additional Comments for Danger to Others Potential: No data recorded Are There Guns or Other Weapons in Your Home? No  Types of Guns/Weapons: No data recorded Are These Weapons Safely Secured?                            No  Who Could Verify You Are Able To Have These Secured: No data recorded Do You Have any Outstanding Charges, Pending Court Dates, Parole/Probation? No data recorded Contacted To Inform of Risk of Harm To Self or Others: No data recorded  Does Patient Present under Involuntary Commitment? No  [place] of Residence: [place]  Patient Currently Receiving the Following  Services: Medication Management  Determination of Need: Emergent (2 hours)  Options For Referral: Inpatient Hospitalization; ED Visit  Discharge Disposition:    Lilyan Gilford MS, LCAS, [facility], [facility] Therapeutic Triage Specialist 09/23/2023 11:16 PM

## 2023-09-23 NOTE — ED Notes (Signed)
EDP Quale talking to pt again at bedside re: new complaint of SI.

## 2023-09-24 ENCOUNTER — Inpatient Hospital Stay
Admission: AD | Admit: 2023-09-24 | Discharge: 2023-10-06 | DRG: 885 | Disposition: A | Discharge status: Home / Self Care | Payer: No Typology Code available for payment source | Source: Intra-hospital | Attending: Psychiatry | Admitting: Psychiatry

## 2023-09-24 ENCOUNTER — Encounter: Payer: Self-pay | Admitting: Internal Medicine

## 2023-09-24 ENCOUNTER — Other Ambulatory Visit: Payer: Self-pay

## 2023-09-24 DIAGNOSIS — F410 Panic disorder [episodic paroxysmal anxiety] without agoraphobia: Secondary | ICD-10-CM | POA: Diagnosis present

## 2023-09-24 DIAGNOSIS — Z9151 Personal history of suicidal behavior: Secondary | ICD-10-CM

## 2023-09-24 DIAGNOSIS — I10 Essential (primary) hypertension: Secondary | ICD-10-CM | POA: Diagnosis present

## 2023-09-24 DIAGNOSIS — F329 Major depressive disorder, single episode, unspecified: Secondary | ICD-10-CM

## 2023-09-24 DIAGNOSIS — Z880 Allergy status to penicillin: Secondary | ICD-10-CM

## 2023-09-24 DIAGNOSIS — Z5941 Food insecurity: Secondary | ICD-10-CM

## 2023-09-24 DIAGNOSIS — Z79899 Other long term (current) drug therapy: Secondary | ICD-10-CM

## 2023-09-24 DIAGNOSIS — R45851 Suicidal ideations: Secondary | ICD-10-CM

## 2023-09-24 DIAGNOSIS — F3341 Major depressive disorder, recurrent, in partial remission: Secondary | ICD-10-CM | POA: Diagnosis not present

## 2023-09-24 DIAGNOSIS — Z560 Unemployment, unspecified: Secondary | ICD-10-CM | POA: Diagnosis not present

## 2023-09-24 DIAGNOSIS — Z885 Allergy status to narcotic agent status: Secondary | ICD-10-CM | POA: Diagnosis not present

## 2023-09-24 DIAGNOSIS — F323 Major depressive disorder, single episode, severe with psychotic features: Principal | ICD-10-CM | POA: Diagnosis present

## 2023-09-24 DIAGNOSIS — Z87891 Personal history of nicotine dependence: Secondary | ICD-10-CM | POA: Diagnosis not present

## 2023-09-24 DIAGNOSIS — R45850 Homicidal ideations: Secondary | ICD-10-CM | POA: Diagnosis present

## 2023-09-24 DIAGNOSIS — F339 Major depressive disorder, recurrent, unspecified: Secondary | ICD-10-CM | POA: Diagnosis present

## 2023-09-24 DIAGNOSIS — F411 Generalized anxiety disorder: Secondary | ICD-10-CM | POA: Diagnosis present

## 2023-09-24 DIAGNOSIS — G4700 Insomnia, unspecified: Secondary | ICD-10-CM | POA: Diagnosis present

## 2023-09-24 DIAGNOSIS — F324 Major depressive disorder, single episode, in partial remission: Principal | ICD-10-CM

## 2023-09-24 DIAGNOSIS — Z635 Disruption of family by separation and divorce: Secondary | ICD-10-CM | POA: Diagnosis not present

## 2023-09-24 DIAGNOSIS — R440 Auditory hallucinations: Secondary | ICD-10-CM | POA: Insufficient documentation

## 2023-09-24 LAB — URINE DRUG SCREEN, QUALITATIVE (ARMC ONLY)
Amphetamines, Ur Screen: NOT DETECTED
Barbiturates, Ur Screen: NOT DETECTED
Benzodiazepine, Ur Scrn: NOT DETECTED
Cannabinoid 50 Ng, Ur ~~LOC~~: NOT DETECTED
Cocaine Metabolite,Ur ~~LOC~~: NOT DETECTED
MDMA (Ecstasy)Ur Screen: NOT DETECTED
Methadone Scn, Ur: NOT DETECTED
Opiate, Ur Screen: NOT DETECTED
Phencyclidine (PCP) Ur S: NOT DETECTED
Tricyclic, Ur Screen: NOT DETECTED

## 2023-09-24 MED ORDER — MAGNESIUM HYDROXIDE 400 MG/5ML PO SUSP
30.0000 mL | Freq: Every day | ORAL | Status: DC | PRN
  Administered 2023-09-27 – 2023-10-05 (×4): 30 mL via ORAL
  Filled 2023-09-24 (×4): qty 30

## 2023-09-24 MED ORDER — ESCITALOPRAM OXALATE 10 MG PO TABS
10.0000 mg | ORAL_TABLET | Freq: Every day | ORAL | Status: DC
  Administered 2023-09-25: 10 mg via ORAL
  Filled 2023-09-24: qty 1

## 2023-09-24 MED ORDER — TRAZODONE HCL 50 MG PO TABS
50.0000 mg | ORAL_TABLET | Freq: Every evening | ORAL | Status: DC | PRN
  Administered 2023-09-24: 50 mg via ORAL
  Filled 2023-09-24: qty 1

## 2023-09-24 MED ORDER — HYDROXYZINE HCL 25 MG PO TABS
75.0000 mg | ORAL_TABLET | Freq: Three times a day (TID) | ORAL | Status: DC | PRN
  Administered 2023-09-24: 75 mg via ORAL
  Filled 2023-09-24: qty 3

## 2023-09-24 MED ORDER — GABAPENTIN 100 MG PO CAPS
100.0000 mg | ORAL_CAPSULE | Freq: Three times a day (TID) | ORAL | Status: DC
  Administered 2023-09-25 – 2023-10-03 (×26): 100 mg via ORAL
  Filled 2023-09-24 (×26): qty 1

## 2023-09-24 MED ORDER — HALOPERIDOL 5 MG PO TABS: 5.0000 mg | ORAL_TABLET | Freq: Three times a day (TID) | ORAL | Status: DC | PRN

## 2023-09-24 MED ORDER — HYDROXYZINE HCL 25 MG PO TABS
75.0000 mg | ORAL_TABLET | Freq: Three times a day (TID) | ORAL | Status: DC | PRN
  Administered 2023-09-24 – 2023-10-01 (×5): 75 mg via ORAL
  Filled 2023-09-24 (×5): qty 1

## 2023-09-24 MED ORDER — ACETAMINOPHEN 325 MG PO TABS: 650.0000 mg | ORAL_TABLET | Freq: Four times a day (QID) | ORAL | Status: DC | PRN

## 2023-09-24 MED ORDER — ALUM & MAG HYDROXIDE-SIMETH 200-200-20 MG/5ML PO SUSP
30.0000 mL | ORAL | Status: DC | PRN
  Administered 2023-09-25 – 2023-10-04 (×3): 30 mL via ORAL
  Filled 2023-09-24 (×3): qty 30

## 2023-09-24 MED ORDER — DIPHENHYDRAMINE HCL 25 MG PO CAPS: 50.0000 mg | ORAL_CAPSULE | Freq: Three times a day (TID) | ORAL | Status: DC | PRN

## 2023-09-24 NOTE — Plan of Care (Signed)
?  Problem: Education: ?Goal: Emotional status will improve ?Outcome: Progressing ?  ?Problem: Coping: ?Goal: Ability to verbalize frustrations and anger appropriately will improve ?Outcome: Progressing ?  ?Problem: Safety: ?Goal: Periods of time without injury will increase ?Outcome: Progressing ?  ?

## 2023-09-24 NOTE — BH Assessment (Signed)
Patient is to be admitted to [facility]U today 09/24/23 later this evening pending discharges by Dr.  Marlou Porch .  Attending Physician will be Dr. Marlou Porch.   Patient has been assigned to room 312, by [facility] Charge Nurse Gigi.    ER staff is aware of the admission: Misty Stanley, ER Secretary   Dr. Modesto Charon, ER MD  Toniann Fail, Patient's Nurse  An Patient Access.

## 2023-09-24 NOTE — Progress Notes (Signed)
Pleasant and cooperative with care. Denies SI, HI, AVH. Endorses anxiety. Prn given with good relief. Noted walking halls. Minimal interaction with peers. Flat affect but brightens on approach. Encouragement and support provided. Safety checks maintained. Medications given as prescribed. Pt receptive and remains safe on unit with q 15 min checks/

## 2023-09-24 NOTE — ED Notes (Signed)
MD increased atarax for anxiety, Patient asking nurse about when He will be transferred, Nurse let him know that she would keep him updated, no word as of yet. Patient is more calm at this time.

## 2023-09-24 NOTE — ED Notes (Signed)
Patient is transferring to BMU for admit to room 312, report was called to [employer]

## 2023-09-24 NOTE — BH Assessment (Signed)
Updated [facility] AC [facility] about patient's disposition for inpatient treatment. "No available beds at this time. We can reassess pending discharges in the AM."

## 2023-09-24 NOTE — ED Notes (Signed)
Patient ask for His atarax for anxiety, states that He does feel anxious and Is still having Si thoughts without a plan and that He is glad He is going for treatment. Nurse will continue too monitor for safety.

## 2023-09-24 NOTE — Progress Notes (Signed)
32 year old male patient admitted for major depressive disorder.Patient appears anxious but cooperative with admission assessment. Patient states that reason for admission is " I have anxiety all my life. I couldn't sleep I couldn't work. I had an anxiety attack at my job and I lost it.I have uncontrolled sex drive also." Patient stated that he is hearing voices multiple times a day to " stab somebody or hurt himself." Patient denies SI,HI and AVH at this time.Rated his anxiety 8/10. Vistaril given. Oriented to unit and made comfortable in room. Skin assessment and body search done. No contraband found. Support and encouragement given.

## 2023-09-24 NOTE — ED Notes (Signed)
Patient up to the bathroom, no signs of distress. 

## 2023-09-24 NOTE — Tx Team (Signed)
Initial Treatment Plan 09/24/2023 4:11 PM [name]:[id number]    PATIENT STRESSORS: Financial difficulties   Health problems   Occupational concerns     PATIENT STRENGTHS: Average or above average intelligence  Communication skills  Physical Health  Supportive family/friends    PATIENT IDENTIFIED PROBLEMS: Unemployment  Anxiety  Hallucination                 DISCHARGE CRITERIA:  Ability to meet basic life and health needs Adequate post-discharge living arrangements Medical problems require only outpatient monitoring Verbal commitment to aftercare and medication compliance  PRELIMINARY DISCHARGE PLAN: Attend aftercare/continuing care group Return to previous living arrangement  PATIENT/FAMILY INVOLVEMENT: This treatment plan has been presented to and reviewed with the patient, David Salinas, and/or family member,.  The patient and family have been given the opportunity to ask questions and make suggestions.  Leonarda Salon, RN 09/24/2023, 4:11 PM

## 2023-09-24 NOTE — ED Notes (Signed)
Patient complaining of anxiety, nurse did administer hydroxyzine po for anxiety.

## 2023-09-24 NOTE — Group Note (Unsigned)
Date:  09/25/2023 Time:  12:06 AM  Group Topic/Focus:  Wrap-Up Group:   The focus of this group is to help patients review their daily goal of treatment and discuss progress on daily workbooks.    Participation Level:  Did Not Attend  Lenore Cordia 09/25/2023, 12:06 AM

## 2023-09-25 DIAGNOSIS — F323 Major depressive disorder, single episode, severe with psychotic features: Secondary | ICD-10-CM | POA: Diagnosis not present

## 2023-09-25 MED ORDER — TRAZODONE HCL 100 MG PO TABS
100.0000 mg | ORAL_TABLET | Freq: Every evening | ORAL | Status: DC | PRN
  Administered 2023-09-25 – 2023-10-05 (×11): 100 mg via ORAL
  Filled 2023-09-25 (×11): qty 1

## 2023-09-25 MED ORDER — LOSARTAN POTASSIUM 25 MG PO TABS
25.0000 mg | ORAL_TABLET | Freq: Every day | ORAL | Status: DC
  Administered 2023-09-25 – 2023-09-26 (×2): 25 mg via ORAL
  Filled 2023-09-25 (×2): qty 1

## 2023-09-25 MED ORDER — FLUOXETINE HCL 20 MG PO CAPS
20.0000 mg | ORAL_CAPSULE | Freq: Every day | ORAL | Status: DC
  Administered 2023-09-25 – 2023-09-28 (×4): 20 mg via ORAL
  Filled 2023-09-25 (×4): qty 1

## 2023-09-25 MED ORDER — RISPERIDONE 1 MG PO TABS
0.5000 mg | ORAL_TABLET | ORAL | Status: DC
  Administered 2023-09-25 – 2023-09-26 (×3): 0.5 mg via ORAL
  Filled 2023-09-25 (×3): qty 1

## 2023-09-25 MED ORDER — DOXEPIN HCL 50 MG PO CAPS
50.0000 mg | ORAL_CAPSULE | Freq: Every day | ORAL | Status: DC
  Administered 2023-09-25 – 2023-10-05 (×11): 50 mg via ORAL
  Filled 2023-09-25 (×11): qty 1

## 2023-09-25 MED ORDER — LOPERAMIDE HCL 2 MG PO CAPS
2.0000 mg | ORAL_CAPSULE | ORAL | Status: DC | PRN
  Administered 2023-09-25: 2 mg via ORAL
  Filled 2023-09-25: qty 1

## 2023-09-25 NOTE — Group Note (Signed)
Date:  09/25/2023 Time:  4:31 PM  Group Topic/Focus:  Activity Group:  The focus of the group is to promote activity outside in the courtyard to get some fresh air and some exercise.    Participation Level:  Did Not Attend   David Salinas 09/25/2023, 4:31 PM

## 2023-09-25 NOTE — Group Note (Signed)
Recreation Therapy Group Note   Group Topic:Relaxation  Group Date: 09/25/2023 Start Time: 1000 End Time: 1050 Facilitators: Rosina Lowenstein, LRT, CTRS Location:  Craft Room  Group Description: PMR (Progressive Muscle Relaxation). LRT asks patients their current level of stress/anxiety from 1-10, with 10 being the highest. LRT educates patients on what PMR is and the benefits that come from it. Patients are asked to sit with their feet flat on the floor while sitting up and all the way back in their chair, if possible. LRT and pts follow a prompt through a speaker that requires you to tense and release different muscles in their body and focus on their breathing. During session, lights are off and soft music is being played. Pts are given a stress ball to use if needed. At the end of the prompt, LRT asks patients to rank their current levels of stress/anxiety from 1-10, 10 being the highest. LRT provides patients with an education handout on PMR.   Goal Area(s) Addressed:  Patients will be able to describe progressive muscle relaxation.  Patient will practice using relaxation technique. Patient will identify a new coping skill.  Patient will follow multistep directions to reduce anxiety and stress.   Affect/Mood: N/A   Participation Level: Did not attend    Clinical Observations/Individualized Feedback: Davied did not attend group.   Plan: Continue to engage patient in RT group sessions 2-3x/week.   Rosina Lowenstein, LRT, CTRS 09/25/2023 11:39 AM

## 2023-09-25 NOTE — [facility] Suicide Risk Assessment (Signed)
[facility] Admission Suicide Risk Assessment   Nursing information obtained from:  Patient Demographic factors:  Male, Unemployed Current Mental Status:  Self-harm thoughts Loss Factors:  Financial problems / change in socioeconomic status Historical Factors:  Impulsivity Risk Reduction Factors:  Living with another person, especially a relative  Total Time spent with patient: 1 hour Principal Problem: Major depressive disorder, single episode, severe with psychotic features (HCC) Diagnosis:  Principal Problem:   Major depressive disorder, single episode, severe with psychotic features (HCC) Active Problems:   Suicidal ideation   Generalized anxiety disorder with panic attacks  Subjective Data: David Salinas 32 y.o., male patient with a past psychiatric history of anxiety who presented to [facility] Emergency Department voluntarily as a walk-in with initial reports of anxiety and his heart racing.  He was evaluated by the ED physician and had been informed he would be discharged, at that time [name] shared that he was experiencing suicidal ideation.   On my evaluation Mr. David Salinas reports increased anxiety over the past month and that he was seen by his psychiatrist at [facility] approximately 2 weeks ago for the same.  He is taking hydroxyzine and Paxil and reports that Paxil is not effective but hydroxyzine does help with anxiety symptoms.  During my evaluation Mr. David Salinas reports active suicidal ideation with a plan to hang himself.  He endorses a prior suicide attempt approximately 17 years ago. Mr. David Salinas endorses auditory hallucinations on exam and states that the voices tell him to harm himself.  He also endorses being paranoid at times and feeling as though the world is working against him.  He endorses feelings of depression and hopelessness, difficulty sleeping, feeling tired, feeling as though he is a failure, and difficulty concentrating.  He reports fair sleep and that he gets  approximately 5 hours per night.  He has not noticed a change in his appetite. He reports for the past 2 weeks he has been living at his grandparents residence.  He reports that he has not been kicked out of his own residence and still retains that residence. Mr. David Salinas reports currently being unemployed stating that he has difficulty keeping jobs secondary to depressive symptoms. 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  He reports he lost that job approximately 1.5 months ago.  Mr. Filar denies any current substance use to include illicit drugs, EtOH, tobacco products, or vaping.  Continued Clinical Symptoms:  Alcohol Use Disorder Identification Test Final Score (AUDIT): 0 The "Alcohol Use Disorders Identification Test", Guidelines for Use in Primary Care, Second Edition.  World Science writer [facility]). Score between 0-7:  no or low risk or alcohol related problems. Score between 8-15:  moderate risk of alcohol related problems. Score between 16-19:  high risk of alcohol related problems. Score 20 or above:  warrants further diagnostic evaluation for alcohol dependence and treatment.   CLINICAL FACTORS:   Severe Anxiety and/or Agitation Depression:   Impulsivity Insomnia Obsessive-Compulsive Disorder   Musculoskeletal: Strength & Muscle Tone: within normal limits Gait & Station: normal Patient leans: N/A  Psychiatric Specialty Exam:  Presentation  General Appearance:  Casual  Eye Contact: Fair  Speech: Clear and Coherent  Speech Volume: Decreased  Handedness: Right   Mood and Affect  Mood: Depressed; Anxious  Affect: Congruent   Thought Process  Thought Processes: Coherent  Descriptions of Associations:Intact  Orientation:Full (Time, Place and Person)  Thought Content:Paranoid Ideation  History of Schizophrenia/Schizoaffective disorder:No  Duration of Psychotic Symptoms:Less than  six months  Hallucinations:No data  recorded Ideas of Reference:None  Suicidal Thoughts:No data recorded Homicidal Thoughts:No data recorded  Sensorium  Memory: Immediate Fair; Recent Fair; Remote Fair  Judgment: Fair  Insight: Fair   Art therapist  Concentration: Fair  Attention Span: Fair  Recall: Good  Fund of Knowledge: Good  Language: Good   Psychomotor Activity  Psychomotor Activity:No data recorded  Assets  Assets: Communication Skills; Desire for Improvement; Housing; Leisure Time   Sleep  Sleep:No data recorded    Blood pressure 135/82, pulse 88, temperature 98 F (36.7 C), resp. rate 18, height 5\' 6"  (1.676 m), weight 86.6 kg, SpO2 97%. Body mass index is 30.83 kg/m.   COGNITIVE FEATURES THAT CONTRIBUTE TO RISK:  None    SUICIDE RISK:   Mild:  Suicidal ideation of limited frequency, intensity, duration, and specificity.  There are no identifiable plans, no associated intent, mild dysphoria and related symptoms, good self-control (both objective and subjective assessment), few other risk factors, and identifiable protective factors, including available and accessible social support.  PLAN OF CARE: See orders  I certify that inpatient services furnished can reasonably be expected to improve the patient's condition.   Maylen Waltermire Tresea Mall, DO 09/25/2023, 11:07 AM

## 2023-09-25 NOTE — Plan of Care (Signed)
  Problem: Education: Goal: Mental status will improve Outcome: Progressing   Problem: Health Behavior/Discharge Planning: Goal: Compliance with treatment plan for underlying cause of condition will improve Outcome: Progressing   Problem: Safety: Goal: Periods of time without injury will increase Outcome: Progressing   

## 2023-09-25 NOTE — Group Note (Signed)
Date:  09/25/2023 Time:  9:45 PM  Group Topic/Focus:  Orientation:   The focus of this group is to educate the patient on the purpose and policies of crisis stabilization and provide a format to answer questions about their admission.  The group details unit policies and expectations of patients while admitted.    Participation Level:  Active  Participation Quality:  Appropriate and Attentive  Affect:  Appropriate  Cognitive:  Alert and Appropriate  Insight: Appropriate and Good  Engagement in Group:  Developing/Improving  Modes of Intervention:  Clarification, Education, Orientation, and Support  Additional Comments:     Jearl Soto 09/25/2023, 9:45 PM

## 2023-09-25 NOTE — H&P (Signed)
Psychiatric Admission Assessment Adult  Patient Identification: David Salinas MRN:  [id number] Date of Evaluation:  09/25/2023 Chief Complaint:  MDD (major depressive disorder) [F32.9] Principal Diagnosis: Major depressive disorder, single episode, severe with psychotic features (HCC) Diagnosis:  Principal Problem:   Major depressive disorder, single episode, severe with psychotic features (HCC) Active Problems:   Suicidal ideation   Generalized anxiety disorder with panic attacks  History of Present Illness: David Salinas is a 32 year old male with a long history of depression and anxiety.  He was admitted at age 87 to [facility] for depression and then placed on Prozac which she states he did pretty well on.  Recently he went to [facility] and was placed on 10 mg of Paxil and 10 mg Atarax.  States it has not been helpful.  He endorses anhedonia, difficulty sleeping, intrusive thoughts of self-harm and harming others.  He had an anxiety attack at work about a month ago and lost his job.  He says he has intrusive images of stabbing people.  I asked him about any other repetitive behaviors and he counts in threes.  He complains of depressed mood and auditory hallucinations along with insomnia and anxiety.  He is currently living with his grandparents in [place].  He also tells me that he has a history of hypertension but it it has not been treated.  Nurse practitioner evaluation: [name] 32 y.o., male patient with a past psychiatric history of anxiety who presented to [facility] Emergency Department voluntarily as a walk-in with initial reports of anxiety and his heart racing.  He was evaluated by the ED physician and had been informed he would be discharged, at that time Mr. Sears shared that he was experiencing suicidal ideation.   On my evaluation Mr. Wolak reports increased anxiety over the past month and that he was seen by his psychiatrist at [facility] approximately 2 weeks ago for the same.   He is taking hydroxyzine and Paxil and reports that Paxil is not effective but hydroxyzine does help with anxiety symptoms.  During my evaluation Mr. Lackland reports active suicidal ideation with a plan to hang himself.  He endorses a prior suicide attempt approximately 17 years ago. Mr. Naves endorses auditory hallucinations on exam and states that the voices tell him to harm himself.  He also endorses being paranoid at times and feeling as though the world is working against him.  He endorses feelings of depression and hopelessness, difficulty sleeping, feeling tired, feeling as though he is a failure, and difficulty concentrating.  He reports fair sleep and that he gets approximately 5 hours per night.  He has not noticed a change in his appetite. He reports for the past 2 weeks he has been living at his grandparents residence.  He reports that he has not been kicked out of his own residence and still retains that residence. Mr. Yochum reports currently being unemployed stating that he has difficulty keeping jobs secondary to depressive symptoms. 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  He reports he lost that job approximately 1.5 months ago.  Mr. Lachat denies any current substance use to include illicit drugs, EtOH, tobacco products, or vaping.  Associated Signs/Symptoms: Depression Symptoms:  depressed mood, anhedonia, insomnia, suicidal thoughts without plan, anxiety, (Hypo) Manic Symptoms:  Hallucinations, Impulsivity, Labiality of Mood, Anxiety Symptoms:  Panic Symptoms, Obsessive Compulsive Symptoms:   Counting,, Psychotic Symptoms:  Hallucinations: Auditory PTSD Symptoms: NA Total Time spent with patient: 1 hour  Past Psychiatric History: 1 previous psychiatric admission; recently went to [facility] outpatient  Is the patient at risk to self? Yes.    Has the patient been a risk to self in the past 6 months? Yes.    Has the patient been a risk to self within the  distant past? Yes.    Is the patient a risk to others? Yes.    Has the patient been a risk to others in the past 6 months? Yes.    Has the patient been a risk to others within the distant past? Yes.     [place] Scale:  Flowsheet Row Admission (Current) from 09/24/2023 in [facility] INPATIENT BEHAVIORAL MEDICINE ED from 09/23/2023 in [facility] Emergency Department at [facility] ED from 09/21/2023 in [facility] Emergency Department at [facility]  C-SSRS RISK CATEGORY High Risk High Risk No Risk        Prior Inpatient Therapy: Yes.   If yes, describe as above Prior Outpatient Therapy: Yes.   If yes, describe as above  Alcohol Screening: 1. How often do you have a drink containing alcohol?: Never 2. How many drinks containing alcohol do you have on a typical day when you are drinking?: 1 or 2 3. How often do you have six or more drinks on one occasion?: Never AUDIT-C Score: 0 4. How often during the last year have you found that you were not able to stop drinking once you had started?: Never 5. How often during the last year have you failed to do what was normally expected from you because of drinking?: Never 6. How often during the last year have you needed a first drink in the morning to get yourself going after a heavy drinking session?: Never 7. How often during the last year have you had a feeling of guilt of remorse after drinking?: Never 8. How often during the last year have you been unable to remember what happened the night before because you had been drinking?: Never 9. Have you or someone else been injured as a result of your drinking?: No 10. Has a relative or friend or a doctor or another health worker been concerned about your drinking or suggested you cut down?: No Alcohol Use Disorder Identification Test Final Score (AUDIT): 0 Alcohol Brief Interventions/Follow-up: Patient Refused Substance Abuse History in the last 12 months:  No. Consequences of Substance  Abuse: NA Previous Psychotropic Medications: Yes  Psychological Evaluations: Yes  Past Medical History:  Past Medical History:  Diagnosis Date   Anxiety    Asthma    Chronic bronchitis (HCC)    Panic attacks     Past Surgical History:  Procedure Laterality Date   TONSILLECTOMY     Family History: History reviewed. No pertinent family history. Family Psychiatric  History: Unremarkable Tobacco Screening:  Social History   Tobacco Use  Smoking Status Former   Current packs/day: 0.50   Types: Cigarettes  Smokeless Tobacco Never    BH Tobacco Counseling     Are you interested in Tobacco Cessation Medications?  N/A, patient does not use tobacco products Counseled patient on smoking cessation:  N/A, patient does not use tobacco products Reason Tobacco Screening Not Completed: No value filed.       Social History:  Social History   Substance and Sexual Activity  Alcohol Use Not Currently     Social History   Substance and Sexual Activity  Drug Use No    Additional Social History:  Allergies:   Allergies  Allergen Reactions   Codeine     Heart racing      Penicillins     Anaphylaxis    Lab Results:  Results for orders placed or performed during the hospital encounter of 09/23/23 (from the past 48 hour(s))  Urine Drug Screen, Qualitative ([facility] only)     Status: None   Collection Time: 09/24/23 11:36 AM  Result Value Ref Range   Tricyclic, Ur Screen NONE DETECTED NONE DETECTED   Amphetamines, Ur Screen NONE DETECTED NONE DETECTED   MDMA (Ecstasy)Ur Screen NONE DETECTED NONE DETECTED   Cocaine Metabolite,Ur [place] NONE DETECTED NONE DETECTED   Opiate, Ur Screen NONE DETECTED NONE DETECTED   Phencyclidine (PCP) Ur S NONE DETECTED NONE DETECTED   Cannabinoid 50 Ng, Ur [place] NONE DETECTED NONE DETECTED   Barbiturates, Ur Screen NONE DETECTED NONE DETECTED   Benzodiazepine, Ur Scrn NONE DETECTED NONE DETECTED   Methadone Scn, Ur  NONE DETECTED NONE DETECTED    Comment: (NOTE) Tricyclics + metabolites, urine    Cutoff 1000 ng/mL Amphetamines + metabolites, urine  Cutoff 1000 ng/mL MDMA (Ecstasy), urine              Cutoff 500 ng/mL Cocaine Metabolite, urine          Cutoff 300 ng/mL Opiate + metabolites, urine        Cutoff 300 ng/mL Phencyclidine (PCP), urine         Cutoff 25 ng/mL Cannabinoid, urine                 Cutoff 50 ng/mL Barbiturates + metabolites, urine  Cutoff 200 ng/mL Benzodiazepine, urine              Cutoff 200 ng/mL Methadone, urine                   Cutoff 300 ng/mL  The urine drug screen provides only a preliminary, unconfirmed analytical test result and should not be used for non-medical purposes. Clinical consideration and professional judgment should be applied to any positive drug screen result due to possible interfering substances. A more specific alternate chemical method must be used in order to obtain a confirmed analytical result. Gas chromatography / mass spectrometry (GC/MS) is the preferred confirm atory method. Performed at [facility], [address] [place], [place] [address]     Blood Alcohol level:  No results found for: "[facility]"  Metabolic Disorder Labs:  Lab Results  Component Value Date   HGBA1C  03/16/2009    5.1 (NOTE) The ADA recommends the following therapeutic goal for glycemic control related to Hgb A1c measurement: Goal of therapy: <6.5 Hgb A1c  Reference: American Diabetes Association: Clinical Practice Recommendations 2010, Diabetes Care, 2010, 33: (Suppl  1).   MPG 100 03/16/2009   No results found for: "PROLACTIN" Lab Results  Component Value Date   CHOL  03/16/2009    158        ATP III CLASSIFICATION:  <200     mg/dL   Desirable  416-606  mg/dL   Borderline High  >=301    mg/dL   High          TRIG 45 03/16/2009   HDL 36 03/16/2009   CHOLHDL 4.4 03/16/2009   VLDL 9 03/16/2009   LDLCALC (H) 03/16/2009    113        Total  Cholesterol/HDL:CHD Risk Coronary Heart Disease Risk Table  Men   Women  1/2 Average Risk   3.4   3.3  Average Risk       5.0   4.4  2 X Average Risk   9.6   7.1  3 X Average Risk  23.4   11.0        Use the calculated Patient Ratio above and the CHD Risk Table to determine the patient's CHD Risk.        ATP III CLASSIFICATION (LDL):  <100     mg/dL   Optimal  409-811  mg/dL   Near or Above                    Optimal  130-159  mg/dL   Borderline  914-782  mg/dL   High  >956     mg/dL   Very High    Current Medications: Current Facility-Administered Medications  Medication Dose Route Frequency Provider Last Rate Last Admin   acetaminophen (TYLENOL) tablet 650 mg  650 mg Oral Q6H PRN Lauree Chandler, NP       alum & mag hydroxide-simeth (MAALOX/MYLANTA) 200-200-20 MG/5ML suspension 30 mL  30 mL Oral Q4H PRN Lauree Chandler, NP       haloperidol (HALDOL) tablet 5 mg  5 mg Oral TID PRN Lauree Chandler, NP       And   diphenhydrAMINE (BENADRYL) capsule 50 mg  50 mg Oral TID PRN Lauree Chandler, NP       doxepin (SINEQUAN) capsule 50 mg  50 mg Oral QHS Sarina Ill, DO       FLUoxetine (PROZAC) capsule 20 mg  20 mg Oral Daily Sarina Ill, DO       gabapentin (NEURONTIN) capsule 100 mg  100 mg Oral TID Charm Rings, NP   100 mg at 09/25/23 0859   hydrOXYzine (ATARAX) tablet 75 mg  75 mg Oral TID PRN Lauree Chandler, NP   75 mg at 09/24/23 2120   losartan (COZAAR) tablet 25 mg  25 mg Oral QPC breakfast Sarina Ill, DO       magnesium hydroxide (MILK OF MAGNESIA) suspension 30 mL  30 mL Oral Daily PRN Lauree Chandler, NP       risperiDONE (RISPERDAL) tablet 0.5 mg  0.5 mg Oral [id number] Tykia Mellone Edward, DO       traZODone (DESYREL) tablet 100 mg  100 mg Oral QHS PRN Sarina Ill, DO       PTA Medications: Medications Prior to Admission  Medication Sig Dispense Refill Last Dose    albuterol (VENTOLIN HFA) 108 (90 Base) MCG/ACT inhaler Inhale 1-2 puffs into the lungs every 6 (six) hours as needed for wheezing or shortness of breath. 1 each 0    hydrOXYzine (ATARAX) 25 MG tablet Take 1 tablet (25 mg total) by mouth 3 (three) times daily as needed. 30 tablet 0     Musculoskeletal: Strength & Muscle Tone: within normal limits Gait & Station: normal Patient leans: N/A            Psychiatric Specialty Exam:  Presentation  General Appearance:  Casual  Eye Contact: Fair  Speech: Clear and Coherent  Speech Volume: Decreased  Handedness: Right   Mood and Affect  Mood: Depressed; Anxious  Affect: Congruent   Thought Process  Thought Processes: Coherent  Duration of Psychotic Symptoms:N/A Past Diagnosis of Schizophrenia or Psychoactive disorder: No  Descriptions of Associations:Intact  Orientation:Full (Time, Place and  Person)  Thought Content:Paranoid Ideation  Hallucinations:No data recorded Ideas of Reference:None  Suicidal Thoughts:No data recorded Homicidal Thoughts:No data recorded  Sensorium  Memory: Immediate Fair; Recent Fair; Remote Fair  Judgment: Fair  Insight: Fair   Art therapist  Concentration: Fair  Attention Span: Fair  Recall: Good  Fund of Knowledge: Good  Language: Good   Psychomotor Activity  Psychomotor Activity:No data recorded  Assets  Assets: Communication Skills; Desire for Improvement; Housing; Leisure Time   Sleep  Sleep:No data recorded   Physical Exam: Physical Exam Vitals and nursing note reviewed.  Constitutional:      Appearance: Normal appearance. He is normal weight.  HENT:     Head: Normocephalic and atraumatic.     Nose: Nose normal.     Mouth/Throat:     Pharynx: Oropharynx is clear.  Eyes:     Extraocular Movements: Extraocular movements intact.     Pupils: Pupils are equal, round, and reactive to light.  Cardiovascular:     Rate and Rhythm:  Normal rate and regular rhythm.     Pulses: Normal pulses.     Heart sounds: Normal heart sounds.  Pulmonary:     Effort: Pulmonary effort is normal.     Breath sounds: Normal breath sounds.  Abdominal:     General: Abdomen is flat. Bowel sounds are normal.     Palpations: Abdomen is soft.  Musculoskeletal:        General: Normal range of motion.     Cervical back: Normal range of motion and neck supple.  Skin:    General: Skin is warm and dry.  Neurological:     General: No focal deficit present.     Mental Status: He is alert and oriented to person, place, and time.  Psychiatric:        Attention and Perception: Attention normal. He perceives auditory hallucinations.        Mood and Affect: Mood is depressed. Affect is flat.        Speech: Speech normal.        Behavior: Behavior normal. Behavior is cooperative.        Thought Content: Thought content is paranoid. Thought content includes suicidal ideation.        Cognition and Memory: Cognition and memory normal.        Judgment: Judgment is impulsive.    Review of Systems  Constitutional: Negative.   HENT: Negative.    Eyes: Negative.   Respiratory: Negative.    Cardiovascular: Negative.   Gastrointestinal: Negative.   Genitourinary: Negative.   Musculoskeletal: Negative.   Skin: Negative.   Neurological: Negative.   Endo/Heme/Allergies: Negative.   Psychiatric/Behavioral:  Positive for depression, hallucinations and suicidal ideas. The patient is nervous/anxious and has insomnia.    Blood pressure 135/82, pulse 88, temperature 98 F (36.7 C), resp. rate 18, height 5\' 6"  (1.676 m), weight 86.6 kg, SpO2 97%. Body mass index is 30.83 kg/m.  Treatment Plan Summary: Daily contact with patient to assess and evaluate symptoms and progress in treatment, Medication management, and Plan see orders  Observation Level/Precautions:  15 minute checks  Laboratory:  CBC Chemistry Profile  Psychotherapy:    Medications:     Consultations:    Discharge Concerns:    Estimated LOS:  Other:     Physician Treatment Plan for Primary Diagnosis: Major depressive disorder, single episode, severe with psychotic features (HCC) Long Term Goal(s): Improvement in symptoms so as ready for discharge  Short Term Goals: Ability  to identify changes in lifestyle to reduce recurrence of condition will improve, Ability to verbalize feelings will improve, Ability to disclose and discuss suicidal ideas, Ability to demonstrate self-control will improve, Ability to identify and develop effective coping behaviors will improve, Ability to maintain clinical measurements within normal limits will improve, Compliance with prescribed medications will improve, and Ability to identify triggers associated with substance abuse/mental health issues will improve  Physician Treatment Plan for Secondary Diagnosis: Principal Problem:   Major depressive disorder, single episode, severe with psychotic features (HCC) Active Problems:   Suicidal ideation   Generalized anxiety disorder with panic attacks   I certify that inpatient services furnished can reasonably be expected to improve the patient's condition.    Christoffer Currier Tresea Mall, DO 11/21/202411:11 AM

## 2023-09-25 NOTE — Progress Notes (Signed)
Patient denies SI and HI. Patient denies visual hallucinations but endorses auditory hallucinations. Patient states his depression is a 6 out of 10 and anxiety 9 out of 10. Patient remains hopeful that medication adjustment will help. Patient verbalized diarrhea and recently received imodium po prn. Patient will make RN aware if not effective. Patient states his goal for today is to maintain "self-control" and "try to stay in reality as much as possible." Patient is med compliant and remains cooperative on unit.      09/25/23 0859  Psych Admission Type (Psych Patients Only)  Admission Status Voluntary  Psychosocial Assessment  Patient Complaints None  Eye Contact Fair  Facial Expression Flat  Affect Appropriate to circumstance  Speech Logical/coherent  Interaction Assertive  Motor Activity Slow  Appearance/Hygiene Improved  Behavior Characteristics Cooperative;Appropriate to situation  Mood Pleasant  Thought Process  Coherency WDL  Content WDL  Delusions None reported or observed  Perception Hallucinations  Hallucination Auditory  Judgment Impaired  Confusion None  Danger to Self  Current suicidal ideation? Denies  Agreement Not to Harm Self Yes  Description of Agreement verbal  Danger to Others  Danger to Others None reported or observed

## 2023-09-26 DIAGNOSIS — F323 Major depressive disorder, single episode, severe with psychotic features: Secondary | ICD-10-CM | POA: Diagnosis not present

## 2023-09-26 LAB — LIPID PANEL
Cholesterol: 200 mg/dL (ref 0–200)
HDL: 23 mg/dL — ABNORMAL LOW (ref >40)
LDL Cholesterol: 153 mg/dL — ABNORMAL HIGH (ref 0–99)
Total CHOL/HDL Ratio: 8.7 {ratio}
Triglycerides: 118 mg/dL (ref <150)
VLDL: 24 mg/dL (ref 0–40)

## 2023-09-26 MED ORDER — RISPERIDONE 1 MG PO TABS
1.0000 mg | ORAL_TABLET | ORAL | Status: DC
  Administered 2023-09-26 – 2023-09-29 (×6): 1 mg via ORAL
  Filled 2023-09-26 (×6): qty 1

## 2023-09-26 MED ORDER — LOSARTAN POTASSIUM 50 MG PO TABS
100.0000 mg | ORAL_TABLET | Freq: Every day | ORAL | Status: DC
  Administered 2023-09-27 – 2023-10-06 (×10): 100 mg via ORAL
  Filled 2023-09-26 (×10): qty 2

## 2023-09-26 MED ORDER — METOPROLOL SUCCINATE ER 25 MG PO TB24
50.0000 mg | ORAL_TABLET | Freq: Every day | ORAL | Status: DC
Start: 2023-09-27 — End: 2023-10-06
  Administered 2023-09-27 – 2023-10-06 (×10): 50 mg via ORAL
  Filled 2023-09-26 (×10): qty 2

## 2023-09-26 MED ORDER — LOSARTAN POTASSIUM 50 MG PO TABS
50.0000 mg | ORAL_TABLET | Freq: Once | ORAL | Status: AC
  Administered 2023-09-26: 50 mg via ORAL
  Filled 2023-09-26: qty 1

## 2023-09-26 NOTE — Progress Notes (Signed)
[facility] MD Progress Note  09/26/2023 1:44 PM David Salinas  MRN:  [id number] Subjective: David Salinas is seen on rounds.  His blood pressure and pulse are still high so I were to start him on some Toprol and increase his Cozaar.  He says that he slept better last night.  He still has intrusive thoughts surrounding to go up on his Risperdal.  So far he is tolerating medications without any side effects.  Been pleasant and cooperative on the unit.  Nurses report no issues. Principal Problem: Major depressive disorder, single episode, severe with psychotic features (HCC) Diagnosis: Principal Problem:   Major depressive disorder, single episode, severe with psychotic features (HCC) Active Problems:   Suicidal ideation   Generalized anxiety disorder with panic attacks  Total Time spent with patient: 15 minutes  Past Psychiatric History: 1 previous psychiatric admission at age 2  Past Medical History:  Past Medical History:  Diagnosis Date   Anxiety    Asthma    Chronic bronchitis (HCC)    Panic attacks     Past Surgical History:  Procedure Laterality Date   TONSILLECTOMY     Family History: History reviewed. No pertinent family history. Family Psychiatric  History: Unremarkable Social History:  Social History   Substance and Sexual Activity  Alcohol Use Not Currently     Social History   Substance and Sexual Activity  Drug Use No    Social History   Socioeconomic History   Marital status: Legally Separated    Spouse name: Not on file   Number of children: Not on file   Years of education: Not on file   Highest education level: Not on file  Occupational History   Not on file  Tobacco Use   Smoking status: Former    Current packs/day: 0.50    Types: Cigarettes   Smokeless tobacco: Never  Vaping Use   Vaping status: Never Used  Substance and Sexual Activity   Alcohol use: Not Currently   Drug use: No   Sexual activity: Not on file  Other Topics Concern   Not on file   Social History Narrative   Not on file   Social Determinants of Health   Financial Resource Strain: Not on file  Food Insecurity: Food Insecurity Present (09/24/2023)   Hunger Vital Sign    Worried About Running Out of Food in the Last Year: Sometimes true    Ran Out of Food in the Last Year: Sometimes true  Transportation Needs: No Transportation Needs (09/24/2023)   PRAPARE - Administrator, Civil Service (Medical): No    Lack of Transportation (Non-Medical): No  Physical Activity: Not on file  Stress: Not on file  Social Connections: Not on file   Additional Social History:                         Sleep: Good  Appetite:  Good  Current Medications: Current Facility-Administered Medications  Medication Dose Route Frequency Provider Last Rate Last Admin   acetaminophen (TYLENOL) tablet 650 mg  650 mg Oral Q6H PRN Lauree Chandler, NP       alum & mag hydroxide-simeth (MAALOX/MYLANTA) 200-200-20 MG/5ML suspension 30 mL  30 mL Oral Q4H PRN Lauree Chandler, NP   30 mL at 09/25/23 1433   haloperidol (HALDOL) tablet 5 mg  5 mg Oral TID PRN Lauree Chandler, NP       And   diphenhydrAMINE (BENADRYL) capsule  50 mg  50 mg Oral TID PRN Lauree Chandler, NP       doxepin (SINEQUAN) capsule 50 mg  50 mg Oral QHS Sarina Ill, DO   50 mg at 09/25/23 2056   FLUoxetine (PROZAC) capsule 20 mg  20 mg Oral Daily Sarina Ill, DO   20 mg at 09/26/23 0841   gabapentin (NEURONTIN) capsule 100 mg  100 mg Oral TID Charm Rings, NP   100 mg at 09/26/23 1213   hydrOXYzine (ATARAX) tablet 75 mg  75 mg Oral TID PRN Lauree Chandler, NP   75 mg at 09/25/23 2057   loperamide (IMODIUM) capsule 2 mg  2 mg Oral PRN Sarina Ill, DO   2 mg at 09/25/23 1724   [START ON 09/27/2023] losartan (COZAAR) tablet 100 mg  100 mg Oral QPC breakfast Sarina Ill, DO       losartan (COZAAR) tablet 50 mg  50 mg Oral Once Sarina Ill, DO       magnesium hydroxide (MILK OF MAGNESIA) suspension 30 mL  30 mL Oral Daily PRN Lauree Chandler, NP       Melene Muller ON 09/27/2023] metoprolol succinate (TOPROL-XL) 24 hr tablet 50 mg  50 mg Oral Daily Sarina Ill, DO       risperiDONE (RISPERDAL) tablet 1 mg  1 mg Oral BH-[id number] Tahj Njoku Edward, DO       traZODone (DESYREL) tablet 100 mg  100 mg Oral QHS PRN Sarina Ill, DO   100 mg at 09/25/23 2057    Lab Results:  Results for orders placed or performed during the hospital encounter of 09/24/23 (from the past 48 hour(s))  Lipid panel     Status: Abnormal   Collection Time: 09/26/23  7:04 AM  Result Value Ref Range   Cholesterol 200 0 - 200 mg/dL   Triglycerides 409 <811 mg/dL   HDL 23 (L) >91 mg/dL   Total CHOL/HDL Ratio 8.7 RATIO   VLDL 24 0 - 40 mg/dL   LDL Cholesterol 478 (H) 0 - 99 mg/dL    Comment:        Total Cholesterol/HDL:CHD Risk Coronary Heart Disease Risk Table                     Men   Women  1/2 Average Risk   3.4   3.3  Average Risk       5.0   4.4  2 X Average Risk   9.6   7.1  3 X Average Risk  23.4   11.0        Use the calculated Patient Ratio above and the CHD Risk Table to determine the patient's CHD Risk.        ATP III CLASSIFICATION (LDL):  <100     mg/dL   Optimal  295-621  mg/dL   Near or Above                    Optimal  130-159  mg/dL   Borderline  308-657  mg/dL   High  >846     mg/dL   Very High Performed at [facility], [address] [place], [place] [address]     Blood Alcohol level:  No results found for: "[facility]"  Metabolic Disorder Labs: Lab Results  Component Value Date   HGBA1C  03/16/2009    5.1 (NOTE) The ADA recommends the following therapeutic goal  for glycemic control related to Hgb A1c measurement: Goal of therapy: <6.5 Hgb A1c  Reference: American Diabetes Association: Clinical Practice Recommendations 2010, Diabetes Care, 2010, 33: (Suppl  1).   MPG 100  03/16/2009   No results found for: "PROLACTIN" Lab Results  Component Value Date   CHOL 200 09/26/2023   TRIG 118 09/26/2023   HDL 23 (L) 09/26/2023   CHOLHDL 8.7 09/26/2023   VLDL 24 09/26/2023   LDLCALC 153 (H) 09/26/2023   LDLCALC (H) 03/16/2009    113        Total Cholesterol/HDL:CHD Risk Coronary Heart Disease Risk Table                     Men   Women  1/2 Average Risk   3.4   3.3  Average Risk       5.0   4.4  2 X Average Risk   9.6   7.1  3 X Average Risk  23.4   11.0        Use the calculated Patient Ratio above and the CHD Risk Table to determine the patient's CHD Risk.        ATP III CLASSIFICATION (LDL):  <100     mg/dL   Optimal  188-416  mg/dL   Near or Above                    Optimal  130-159  mg/dL   Borderline  606-301  mg/dL   High  >601     mg/dL   Very High    Physical Findings: AIMS:  , ,  ,  ,    CIWA:    COWS:     Musculoskeletal: Strength & Muscle Tone: within normal limits Gait & Station: normal Patient leans: N/A  Psychiatric Specialty Exam:  Presentation  General Appearance:  Casual  Eye Contact: Fair  Speech: Clear and Coherent  Speech Volume: Decreased  Handedness: Right   Mood and Affect  Mood: Depressed; Anxious  Affect: Congruent   Thought Process  Thought Processes: Coherent  Descriptions of Associations:Intact  Orientation:Full (Time, Place and Person)  Thought Content:Paranoid Ideation  History of Schizophrenia/Schizoaffective disorder:No  Duration of Psychotic Symptoms:Less than six months  Hallucinations:No data recorded Ideas of Reference:None  Suicidal Thoughts:No data recorded Homicidal Thoughts:No data recorded  Sensorium  Memory: Immediate Fair; Recent Fair; Remote Fair  Judgment: Fair  Insight: Fair   Art therapist  Concentration: Fair  Attention Span: Fair  Recall: Good  Fund of Knowledge: Good  Language: Good   Psychomotor Activity  Psychomotor  Activity:No data recorded  Assets  Assets: Communication Skills; Desire for Improvement; Housing; Leisure Time   Sleep  Sleep:No data recorded    Blood pressure (!) 157/98, pulse (!) 108, temperature 98.4 F (36.9 C), resp. rate 18, height 5\' 6"  (1.676 m), weight 86.6 kg, SpO2 98%. Body mass index is 30.83 kg/m.   Treatment Plan Summary: Daily contact with patient to assess and evaluate symptoms and progress in treatment, Medication management, and Plan start Toprol-XL 50 mg/day and increase Cozaar to 100 mg starting tomorrow.  Increase Risperdal to 1 mg twice a day.  Sarina Ill, DO 09/26/2023, 1:44 PM

## 2023-09-26 NOTE — Group Note (Signed)
Recreation Therapy Group Note   Group Topic:Leisure Education  Group Date: 09/26/2023 Start Time: 1000 End Time: 1100 Facilitators: Rosina Lowenstein, LRT, CTRS Location:  Craft Room  Group Description: Leisure. Patients were given the option to choose from singing karaoke, coloring mandalas, using oil pastels, journaling, or playing with play-doh. LRT and pts discussed the meaning of leisure, the importance of participating in leisure during their free time/when they're outside of the hospital, as well as how our leisure interests can also serve as coping skills.   Goal Area(s) Addressed:  Patient will identify a current leisure interest.  Patient will learn the definition of "leisure". Patient will practice making a positive decision. Patient will have the opportunity to try a new leisure activity. Patient will communicate with peers and LRT.    Affect/Mood: N/A   Participation Level: Did not attend    Clinical Observations/Individualized Feedback: [place] did not attend group.   Plan: Continue to engage patient in RT group sessions 2-3x/week.   Rosina Lowenstein, LRT, CTRS 09/26/2023 11:59 AM

## 2023-09-26 NOTE — Group Note (Signed)
Date:  09/26/2023 Time:  11:13 PM  Group Topic/Focus:  Wrap-Up Group:   The focus of this group is to help patients review their daily goal of treatment and discuss progress on daily workbooks.    Participation Level:  Active  Participation Quality:  Appropriate  Affect:  Appropriate  Cognitive:  Appropriate  Insight: Appropriate  Engagement in Group:  Engaged  Modes of Intervention:  Discussion   David Salinas 09/26/2023, 11:13 PM

## 2023-09-26 NOTE — BH IP Treatment Plan (Signed)
Interdisciplinary Treatment and Diagnostic Plan Update  09/26/2023 Time of Session: 9:30AM GUERIN VELARDE MRN: [id number]  Principal Diagnosis: Major depressive disorder, single episode, severe with psychotic features (HCC)  Secondary Diagnoses: Principal Problem:   Major depressive disorder, single episode, severe with psychotic features (HCC) Active Problems:   Suicidal ideation   Generalized anxiety disorder with panic attacks   Current Medications:  Current Facility-Administered Medications  Medication Dose Route Frequency Provider Last Rate Last Admin   acetaminophen (TYLENOL) tablet 650 mg  650 mg Oral Q6H PRN Lauree Chandler, NP       alum & mag hydroxide-simeth (MAALOX/MYLANTA) 200-200-20 MG/5ML suspension 30 mL  30 mL Oral Q4H PRN Lauree Chandler, NP   30 mL at 09/25/23 1433   haloperidol (HALDOL) tablet 5 mg  5 mg Oral TID PRN Lauree Chandler, NP       And   diphenhydrAMINE (BENADRYL) capsule 50 mg  50 mg Oral TID PRN Lauree Chandler, NP       doxepin (SINEQUAN) capsule 50 mg  50 mg Oral QHS Sarina Ill, DO   50 mg at 09/25/23 2056   FLUoxetine (PROZAC) capsule 20 mg  20 mg Oral Daily Sarina Ill, DO   20 mg at 09/26/23 0841   gabapentin (NEURONTIN) capsule 100 mg  100 mg Oral TID Charm Rings, NP   100 mg at 09/26/23 0841   hydrOXYzine (ATARAX) tablet 75 mg  75 mg Oral TID PRN Lauree Chandler, NP   75 mg at 09/25/23 2057   loperamide (IMODIUM) capsule 2 mg  2 mg Oral PRN Sarina Ill, DO   2 mg at 09/25/23 1724   losartan (COZAAR) tablet 25 mg  25 mg Oral QPC breakfast Sarina Ill, DO   25 mg at 09/26/23 0841   magnesium hydroxide (MILK OF MAGNESIA) suspension 30 mL  30 mL Oral Daily PRN Lauree Chandler, NP       risperiDONE (RISPERDAL) tablet 0.5 mg  0.5 mg Oral BH-q8a4p Sarina Ill, DO   0.5 mg at 09/26/23 0840   traZODone (DESYREL) tablet 100 mg  100 mg Oral QHS PRN Sarina Ill, DO   100 mg at 09/25/23 2057   PTA Medications: Medications Prior to Admission  Medication Sig Dispense Refill Last Dose   albuterol (VENTOLIN HFA) 108 (90 Base) MCG/ACT inhaler Inhale 1-2 puffs into the lungs every 6 (six) hours as needed for wheezing or shortness of breath. 1 each 0    hydrOXYzine (ATARAX) 25 MG tablet Take 1 tablet (25 mg total) by mouth 3 (three) times daily as needed. 30 tablet 0     Patient Stressors: Financial difficulties   Health problems   Occupational concerns    Patient Strengths: Average or above average intelligence  Communication skills  Physical Health  Supportive family/friends   Treatment Modalities: Medication Management, Group therapy, Case management,  1 to 1 session with clinician, Psychoeducation, Recreational therapy.   Physician Treatment Plan for Primary Diagnosis: Major depressive disorder, single episode, severe with psychotic features (HCC) Long Term Goal(s): Improvement in symptoms so as ready for discharge   Short Term Goals: Ability to identify changes in lifestyle to reduce recurrence of condition will improve Ability to verbalize feelings will improve Ability to disclose and discuss suicidal ideas Ability to demonstrate self-control will improve Ability to identify and develop effective coping behaviors will improve Ability to maintain clinical measurements within normal limits will improve Compliance with  prescribed medications will improve Ability to identify triggers associated with substance abuse/mental health issues will improve  Medication Management: Evaluate patient's response, side effects, and tolerance of medication regimen.  Therapeutic Interventions: 1 to 1 sessions, Unit Group sessions and Medication administration.  Evaluation of Outcomes: Not Met  Physician Treatment Plan for Secondary Diagnosis: Principal Problem:   Major depressive disorder, single episode, severe with psychotic features  (HCC) Active Problems:   Suicidal ideation   Generalized anxiety disorder with panic attacks  Long Term Goal(s): Improvement in symptoms so as ready for discharge   Short Term Goals: Ability to identify changes in lifestyle to reduce recurrence of condition will improve Ability to verbalize feelings will improve Ability to disclose and discuss suicidal ideas Ability to demonstrate self-control will improve Ability to identify and develop effective coping behaviors will improve Ability to maintain clinical measurements within normal limits will improve Compliance with prescribed medications will improve Ability to identify triggers associated with substance abuse/mental health issues will improve     Medication Management: Evaluate patient's response, side effects, and tolerance of medication regimen.  Therapeutic Interventions: 1 to 1 sessions, Unit Group sessions and Medication administration.  Evaluation of Outcomes: Not Met   RN Treatment Plan for Primary Diagnosis: Major depressive disorder, single episode, severe with psychotic features (HCC) Long Term Goal(s): Knowledge of disease and therapeutic regimen to maintain health will improve  Short Term Goals: Ability to demonstrate self-control, Ability to participate in decision making will improve, Ability to verbalize feelings will improve, Ability to disclose and discuss suicidal ideas, Ability to identify and develop effective coping behaviors will improve, and Compliance with prescribed medications will improve  Medication Management: RN will administer medications as ordered by provider, will assess and evaluate patient's response and provide education to patient for prescribed medication. RN will report any adverse and/or side effects to prescribing provider.  Therapeutic Interventions: 1 on 1 counseling sessions, Psychoeducation, Medication administration, Evaluate responses to treatment, Monitor vital signs and CBGs as  ordered, Perform/monitor CIWA, COWS, AIMS and Fall Risk screenings as ordered, Perform wound care treatments as ordered.  Evaluation of Outcomes: Not Met   LCSW Treatment Plan for Primary Diagnosis: Major depressive disorder, single episode, severe with psychotic features (HCC) Long Term Goal(s): Safe transition to appropriate next level of care at discharge, Engage patient in therapeutic group addressing interpersonal concerns.  Short Term Goals: Engage patient in aftercare planning with referrals and resources, Increase social support, Increase ability to appropriately verbalize feelings, Increase emotional regulation, Facilitate acceptance of mental health diagnosis and concerns, Facilitate patient progression through stages of change regarding substance use diagnoses and concerns, Identify triggers associated with mental health/substance abuse issues, and Increase skills for wellness and recovery  Therapeutic Interventions: Assess for all discharge needs, 1 to 1 time with Social worker, Explore available resources and support systems, Assess for adequacy in community support network, Educate family and significant other(s) on suicide prevention, Complete Psychosocial Assessment, Interpersonal group therapy.  Evaluation of Outcomes: Not Met   Progress in Treatment: Attending groups: No. Participating in groups: No. Taking medication as prescribed: Yes. Toleration medication: Yes. Family/Significant other contact made: No, will contact:  once permission has been granted Patient understands diagnosis: Yes. Discussing patient identified problems/goals with staff: Yes. Medical problems stabilized or resolved: Yes. Denies suicidal/homicidal ideation: Yes. Issues/concerns per patient self-inventory: No. Other: none  New problem(s) identified: No, Describe:  none  New Short Term/Long Term Goal(s): detox, elimination of symptoms of psychosis, medication management for mood stabilization;  elimination of SI thoughts; development of comprehensive mental wellness/sobriety plan.   Patient Goals:  Patient declined to attend treatment team  Discharge Plan or Barriers: CSW to assist patient in development of appropriate discharge plans.   Reason for Continuation of Hospitalization: Anxiety Depression Medication stabilization Suicidal ideation  Estimated Length of Stay:  1-7 days  Last 3 [place] Suicide Severity Risk Score: Flowsheet Row Admission (Current) from 09/24/2023 in [facility] ED from 09/23/2023 in [facility] Emergency Department at [facility] ED from 09/21/2023 in [facility] Emergency Department at [facility]  C-SSRS RISK CATEGORY High Risk High Risk No Risk       Last PHQ 2/9 Scores:     No data to display          Scribe for Treatment Team: Harden Mo, LCSW 09/26/2023 11:39 AM

## 2023-09-26 NOTE — Clinical Social Work Note (Signed)
CSW conducted SPE with patient's sister Esau Grew at [phone] with patient's consent. During the conversation sister reported that patient has issues including high blood pressure that he currently will not get assessed for. Patient eats "a lot of fast food" according to sister and this has aided in his current physical ailments. Sister added that she believes many of the patient's problems are due to his physical health.   Sister reported having a conversation in the ED where the patient admitted to having "a sexual addiction" and "sexual disorder." According to the sister, the patient touches himself 5-6 times a day and has admitted to engaging in Necrophilia. Sister added that the patient admitted to going to graveyards and touching himself. Sister mentioned that while searching through the patient's phone, she came across photos of "women and children in caskets." Sister also reported the patient making several phone calls to a "children's hotline" to "hear children talk." Sister added that she is unsure of the patient's reasons for calling the hotline but feels uncomfortable as she has children that the patient is close with. Sister confirmed that there were no explicit photos of children In the patient's phone or search history.   Sw assessed for any immediate needs. There are none at the moment. CSW will continue to assess.   Reymundo Poll, MSW, LCSWA 09/26/2023 3:39 PM

## 2023-09-26 NOTE — [facility] Suicide Risk Assessment (Signed)
[facility] INPATIENT:  Family/Significant Other Suicide Prevention Education  Suicide Prevention Education:  Education Completed; Esau Grew, sister, [phone],  has been identified by the patient as the family member/significant other with whom the patient will be residing, and identified as the person(s) who will aid the patient in the event of a mental health crisis (suicidal ideations/suicide attempt).  With written consent from the patient, the family member/significant other has been provided the following suicide prevention education, prior to the and/or following the discharge of the patient.  The suicide prevention education provided includes the following: Suicide risk factors Suicide prevention and interventions National Suicide Hotline telephone number [facility] assessment telephone number [facility] Emergency Assistance 911 [facility] and/or Residential Mobile Crisis Unit telephone number  Request made of family/significant other to: Remove weapons (e.g., guns, rifles, knives), all items previously/currently identified as safety concern.   Remove drugs/medications (over-the-counter, prescriptions, illicit drugs), all items previously/currently identified as a safety concern.  The family member/significant other verbalizes understanding of the suicide prevention education information provided.  The family member/significant other agrees to remove the items of safety concern listed above.  Lowry Ram 09/26/2023, 3:37 PM

## 2023-09-26 NOTE — Plan of Care (Signed)
  Problem: Education: Goal: Knowledge of [place] [facility] Education information/materials will improve Outcome: Progressing Goal: Emotional status will improve Outcome: Progressing Goal: Mental status will improve Outcome: Progressing Goal: Verbalization of understanding the information provided will improve Outcome: Progressing  Patient continues to endorse Passive SI with no plan and anxiety with depression. Verbally contracts for safety compliant with medications no adverse effects noted. Support provided as needed.
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
## 2023-09-26 NOTE — Group Note (Signed)
Date:  09/26/2023 Time:  6:48 PM  Group Topic/Focus:  Activity Group:  The focus of the group is to promote activity for the patients to receive some fresh air and get some exercise out in the courtyard.    Participation Level:  Did Not Attend   David Salinas David Salinas 09/26/2023, 6:48 PM

## 2023-09-27 DIAGNOSIS — F323 Major depressive disorder, single episode, severe with psychotic features: Secondary | ICD-10-CM | POA: Diagnosis not present

## 2023-09-27 LAB — HEMOGLOBIN A1C
Hgb A1c MFr Bld: 5.9 % — ABNORMAL HIGH (ref 4.8–5.6)
Mean Plasma Glucose: 122.63 mg/dL

## 2023-09-27 MED ORDER — ATORVASTATIN CALCIUM 20 MG PO TABS
10.0000 mg | ORAL_TABLET | Freq: Every day | ORAL | Status: DC
  Administered 2023-09-27 – 2023-10-06 (×10): 10 mg via ORAL
  Filled 2023-09-27 (×10): qty 1

## 2023-09-27 NOTE — Group Note (Signed)
Date:  09/27/2023 Time:  4:06 PM  Group Topic/Focus:  Wellness Toolbox:   The focus of this group is to discuss various aspects of wellness, balancing those aspects and exploring ways to increase the ability to experience wellness.  Patients will create a wellness toolbox for use upon discharge. Outdoor Recreation Activity    Participation Level:  Did Not Attend   David Salinas A Joeph Szatkowski 09/27/2023, 4:06 PM

## 2023-09-27 NOTE — Plan of Care (Signed)
  Problem: Education: Goal: Knowledge of [facility] Education information/materials will improve Outcome: Progressing Goal: Emotional status will improve Outcome: Progressing Goal: Mental status will improve Outcome: Progressing Goal: Verbalization of understanding the information provided will improve Outcome: Progressing   Problem: Activity: Goal: Interest or engagement in activities will improve Outcome: Progressing Goal: Sleeping patterns will improve Outcome: Progressing   Problem: Coping: Goal: Ability to verbalize frustrations and anger appropriately will improve Outcome: Progressing Goal: Ability to demonstrate self-control will improve Outcome: Progressing   Problem: Health Behavior/Discharge Planning: Goal: Identification of resources available to assist in meeting health care needs will improve Outcome: Progressing Goal: Compliance with treatment plan for underlying cause of condition will improve Outcome: Progressing   Problem: Physical Regulation: Goal: Ability to maintain clinical measurements within normal limits will improve Outcome: Progressing   Problem: Safety: Goal: Periods of time without injury will increase Outcome: Progressing   Problem: Education: Goal: Ability to state activities that reduce stress will improve Outcome: Progressing   Problem: Coping: Goal: Ability to identify and develop effective coping behavior will improve Outcome: Progressing   Problem: Self-Concept: Goal: Ability to identify factors that promote anxiety will improve Outcome: Progressing Goal: Level of anxiety will decrease Outcome: Progressing   Problem: Education: Goal: Knowledge of General Education information will improve Description: Including pain rating scale, medication(s)/side effects and non-pharmacologic comfort measures Outcome: Progressing   Problem: Health Behavior/Discharge Planning: Goal: Ability to manage health-related needs will  improve Outcome: Progressing   Problem: Clinical Measurements: Goal: Ability to maintain clinical measurements within normal limits will improve Outcome: Progressing Goal: Will remain free from infection Outcome: Progressing Goal: Diagnostic test results will improve Outcome: Progressing Goal: Respiratory complications will improve Outcome: Progressing Goal: Cardiovascular complication will be avoided Outcome: Progressing   Problem: Activity: Goal: Risk for activity intolerance will decrease Outcome: Progressing   Problem: Nutrition: Goal: Adequate nutrition will be maintained Outcome: Progressing   Problem: Coping: Goal: Level of anxiety will decrease Outcome: Progressing   Problem: Elimination: Goal: Will not experience complications related to bowel motility Outcome: Progressing Goal: Will not experience complications related to urinary retention Outcome: Progressing   Problem: Pain Management: Goal: General experience of comfort will improve Outcome: Progressing   Problem: Safety: Goal: Ability to remain free from injury will improve Outcome: Progressing   Problem: Skin Integrity: Goal: Risk for impaired skin integrity will decrease Outcome: Progressing

## 2023-09-27 NOTE — Progress Notes (Signed)
D- Patient alert and oriented x 4. Affect anxious/mood congruent. Denies SI/ HI/ AVH. Patient denies pain. Patient endorses depression and anxiety. His goal for today is to "get my body in better shape" and "start taking different medications". He had Lipid panel drawn and beginning atorvastatin. A- Scheduled medications administered to patient, per MD orders. Support and encouragement provided.  Routine safety checks conducted every 15 minutes without incident.  Patient informed to notify staff with problems or concerns and verbalizes understanding. R- No adverse drug reactions noted.  Patient compliant with medications and treatment plan. Patient receptive, calm and cooperative. He interacts well with others on the unit.  Patient contracts for safety and  remains safe on the unit at this time.

## 2023-09-27 NOTE — Group Note (Signed)
[facility] LCSW Group Therapy Note   Group Date: 09/27/2023 Start Time: 1315 End Time: 1350   Type of Therapy/Topic:  Group Therapy:  Balance in Life  Participation Level:  Active   Description of Group:    This group will address the concept of balance and how it feels and looks when one is unbalanced. Patients will be encouraged to process areas in their lives that are out of balance, and identify reasons for remaining unbalanced. Facilitators will guide patients utilizing problem- solving interventions to address and correct the stressor making their life unbalanced. Understanding and applying boundaries will be explored and addressed for obtaining  and maintaining a balanced life. Patients will be encouraged to explore ways to assertively make their unbalanced needs known to significant others in their lives, using other group members and facilitator for support and feedback.  Therapeutic Goals: Patient will identify two or more emotions or situations they have that consume much of in their lives. Patient will identify signs/triggers that life has become out of balance:  Patient will identify two ways to set boundaries in order to achieve balance in their lives:  Patient will demonstrate ability to communicate their needs through discussion and/or role plays  Summary of Patient Progress:  The patient attended group. Patient proved open to input from peers and feedback from [facility]. The patient was respectful of peers and participated throughout the entire session. The patient participated during today's icebreaker question.        Marshell Levan, LCSW

## 2023-09-27 NOTE — Progress Notes (Signed)
   09/26/23 2000  Psych Admission Type (Psych Patients Only)  Admission Status Voluntary  Psychosocial Assessment  Patient Complaints Anxiety  Eye Contact Brief;Fair  Facial Expression Animated  Affect Anxious  Speech Rapid  Interaction Assertive  Motor Activity Slow  Appearance/Hygiene Unremarkable;Improved  Behavior Characteristics Cooperative;Anxious  Mood Pleasant;Anxious  Thought Process  Coherency WDL  Content WDL  Delusions None reported or observed  Perception UTA  Hallucination None reported or observed  Judgment Impaired  Confusion WDL  Danger to Self  Current suicidal ideation? Denies  Agreement Not to Harm Self Yes  Description of Agreement verbal  Danger to Others  Danger to Others None reported or observed   Patient alert and oriented x 4, thoughts are organized and coherent, she denies SI/HI/AVH, 15 minutes safety checks maintained.

## 2023-09-27 NOTE — Group Note (Signed)
Date:  09/27/2023 Time:  11:50 AM  Group Topic/Focus:  Goals Group:   The focus of this group is to help patients establish daily goals to achieve during treatment and discuss how the patient can incorporate goal setting into their daily lives to aide in recovery. Making Healthy Choices:   The focus of this group is to help patients identify negative/unhealthy choices they were using prior to admission and identify positive/healthier coping strategies to replace them upon discharge.    Participation Level:  Minimal  Participation Quality:  Appropriate  Affect:  Appropriate  Cognitive:  Appropriate  Insight: Appropriate  Engagement in Group:  Engaged  Modes of Intervention:  Discussion, Education, and Support  Additional Comments:    Wilford Corner 09/27/2023, 11:50 AM

## 2023-09-27 NOTE — Plan of Care (Signed)
  Problem: Education: Goal: Verbalization of understanding the information provided will improve Outcome: Progressing   Problem: Education: Goal: Mental status will improve Outcome: Progressing

## 2023-09-27 NOTE — Progress Notes (Signed)
[facility] MD Progress Note  09/27/2023 1:10 PM David Salinas  MRN:  [id number] Subjective: David Salinas is seen on rounds.  He is intrusive thoughts have decreased since increasing his Risperdal.  Probably benefit from an increase in his Prozac.  He is fixated on his lipid panel which shows his LDLs are a little bit high and his HDLs are little bit low but normal cholesterol.  I would not hurt him to start a low-dose of Lipitor.  He is doing better.  Nurses report no issues.  Good controls and has been compliant. Principal Problem: Major depressive disorder, single episode, severe with psychotic features (HCC) Diagnosis: Principal Problem:   Major depressive disorder, single episode, severe with psychotic features (HCC) Active Problems:   Suicidal ideation   Generalized anxiety disorder with panic attacks  Total Time spent with patient: 15 minutes  Past Psychiatric History: OCD, depression, GAD  Past Medical History:  Past Medical History:  Diagnosis Date   Anxiety    Asthma    Chronic bronchitis (HCC)    Panic attacks     Past Surgical History:  Procedure Laterality Date   TONSILLECTOMY     Family History: History reviewed. No pertinent family history. Family Psychiatric  History: Unremarkable Social History:  Social History   Substance and Sexual Activity  Alcohol Use Not Currently     Social History   Substance and Sexual Activity  Drug Use No    Social History   Socioeconomic History   Marital status: Legally Separated    Spouse name: Not on file   Number of children: Not on file   Years of education: Not on file   Highest education level: Not on file  Occupational History   Not on file  Tobacco Use   Smoking status: Former    Current packs/day: 0.50    Types: Cigarettes   Smokeless tobacco: Never  Vaping Use   Vaping status: Never Used  Substance and Sexual Activity   Alcohol use: Not Currently   Drug use: No   Sexual activity: Not on file  Other Topics  Concern   Not on file  Social History Narrative   Not on file   Social Determinants of Health   Financial Resource Strain: Not on file  Food Insecurity: Food Insecurity Present (09/24/2023)   Hunger Vital Sign    Worried About Running Out of Food in the Last Year: Sometimes true    Ran Out of Food in the Last Year: Sometimes true  Transportation Needs: No Transportation Needs (09/24/2023)   PRAPARE - Administrator, Civil Service (Medical): No    Lack of Transportation (Non-Medical): No  Physical Activity: Not on file  Stress: Not on file  Social Connections: Not on file   Additional Social History:                         Sleep: Good  Appetite:  Good  Current Medications: Current Facility-Administered Medications  Medication Dose Route Frequency Provider Last Rate Last Admin   acetaminophen (TYLENOL) tablet 650 mg  650 mg Oral Q6H PRN Lauree Chandler, NP       alum & mag hydroxide-simeth (MAALOX/MYLANTA) 200-200-20 MG/5ML suspension 30 mL  30 mL Oral Q4H PRN Lauree Chandler, NP   30 mL at 09/25/23 1433   atorvastatin (LIPITOR) tablet 10 mg  10 mg Oral Daily Sarina Ill, DO       haloperidol (HALDOL) tablet  5 mg  5 mg Oral TID PRN Lauree Chandler, NP       And   diphenhydrAMINE (BENADRYL) capsule 50 mg  50 mg Oral TID PRN Lauree Chandler, NP       doxepin (SINEQUAN) capsule 50 mg  50 mg Oral QHS Sarina Ill, DO   50 mg at 09/26/23 2130   FLUoxetine (PROZAC) capsule 20 mg  20 mg Oral Daily Sarina Ill, DO   20 mg at 09/27/23 8119   gabapentin (NEURONTIN) capsule 100 mg  100 mg Oral TID Charm Rings, NP   100 mg at 09/27/23 1247   hydrOXYzine (ATARAX) tablet 75 mg  75 mg Oral TID PRN Lauree Chandler, NP   75 mg at 09/25/23 2057   loperamide (IMODIUM) capsule 2 mg  2 mg Oral PRN Sarina Ill, DO   2 mg at 09/25/23 1724   losartan (COZAAR) tablet 100 mg  100 mg Oral QPC breakfast Sarina Ill, DO   100 mg at 09/27/23 1478   magnesium hydroxide (MILK OF MAGNESIA) suspension 30 mL  30 mL Oral Daily PRN Lauree Chandler, NP   30 mL at 09/27/23 1303   metoprolol succinate (TOPROL-XL) 24 hr tablet 50 mg  50 mg Oral Daily Sarina Ill, DO   50 mg at 09/27/23 2956   risperiDONE (RISPERDAL) tablet 1 mg  1 mg Oral BH-q8a4p Sarina Ill, DO   1 mg at 09/27/23 2130   traZODone (DESYREL) tablet 100 mg  100 mg Oral QHS PRN Sarina Ill, DO   100 mg at 09/26/23 2130    Lab Results:  Results for orders placed or performed during the hospital encounter of 09/24/23 (from the past 48 hour(s))  Lipid panel     Status: Abnormal   Collection Time: 09/26/23  7:04 AM  Result Value Ref Range   Cholesterol 200 0 - 200 mg/dL   Triglycerides 865 <784 mg/dL   HDL 23 (L) >69 mg/dL   Total CHOL/HDL Ratio 8.7 RATIO   VLDL 24 0 - 40 mg/dL   LDL Cholesterol 629 (H) 0 - 99 mg/dL    Comment:        Total Cholesterol/HDL:CHD Risk Coronary Heart Disease Risk Table                     Men   Women  1/2 Average Risk   3.4   3.3  Average Risk       5.0   4.4  2 X Average Risk   9.6   7.1  3 X Average Risk  23.4   11.0        Use the calculated Patient Ratio above and the CHD Risk Table to determine the patient's CHD Risk.        ATP III CLASSIFICATION (LDL):  <100     mg/dL   Optimal  528-413  mg/dL   Near or Above                    Optimal  130-159  mg/dL   Borderline  244-010  mg/dL   High  >272     mg/dL   Very High Performed at [facility], [address] [place], [place] [address]     Blood Alcohol level:  No results found for: "[facility]"  Metabolic Disorder Labs: Lab Results  Component Value Date   HGBA1C  03/16/2009  5.1 (NOTE) The ADA recommends the following therapeutic goal for glycemic control related to Hgb A1c measurement: Goal of therapy: <6.5 Hgb A1c  Reference: American Diabetes Association: Clinical Practice  Recommendations 2010, Diabetes Care, 2010, 33: (Suppl  1).   MPG 100 03/16/2009   No results found for: "PROLACTIN" Lab Results  Component Value Date   CHOL 200 09/26/2023   TRIG 118 09/26/2023   HDL 23 (L) 09/26/2023   CHOLHDL 8.7 09/26/2023   VLDL 24 09/26/2023   LDLCALC 153 (H) 09/26/2023   LDLCALC (H) 03/16/2009    113        Total Cholesterol/HDL:CHD Risk Coronary Heart Disease Risk Table                     Men   Women  1/2 Average Risk   3.4   3.3  Average Risk       5.0   4.4  2 X Average Risk   9.6   7.1  3 X Average Risk  23.4   11.0        Use the calculated Patient Ratio above and the CHD Risk Table to determine the patient's CHD Risk.        ATP III CLASSIFICATION (LDL):  <100     mg/dL   Optimal  829-562  mg/dL   Near or Above                    Optimal  130-159  mg/dL   Borderline  130-865  mg/dL   High  >784     mg/dL   Very High    Physical Findings: AIMS:  , ,  ,  ,    CIWA:    COWS:     Musculoskeletal: Strength & Muscle Tone: within normal limits Gait & Station: normal Patient leans: N/A  Psychiatric Specialty Exam:  Presentation  General Appearance:  Casual  Eye Contact: Fair  Speech: Clear and Coherent  Speech Volume: Decreased  Handedness: Right   Mood and Affect  Mood: Depressed; Anxious  Affect: Congruent   Thought Process  Thought Processes: Coherent  Descriptions of Associations:Intact  Orientation:Full (Time, Place and Person)  Thought Content:Paranoid Ideation  History of Schizophrenia/Schizoaffective disorder:No  Duration of Psychotic Symptoms:Less than six months  Hallucinations:No data recorded Ideas of Reference:None  Suicidal Thoughts:No data recorded Homicidal Thoughts:No data recorded  Sensorium  Memory: Immediate Fair; Recent Fair; Remote Fair  Judgment: Fair  Insight: Fair   Art therapist  Concentration: Fair  Attention Span: Fair  Recall: Good  Fund of  Knowledge: Good  Language: Good   Psychomotor Activity  Psychomotor Activity:No data recorded  Assets  Assets: Communication Skills; Desire for Improvement; Housing; Leisure Time   Sleep  Sleep:No data recorded    Blood pressure 138/85, pulse (!) 120, temperature 98.6 F (37 C), temperature source Oral, resp. rate 16, height 5\' 6"  (1.676 m), weight 86.6 kg, SpO2 98%. Body mass index is 30.83 kg/m.   Treatment Plan Summary: Daily contact with patient to assess and evaluate symptoms and progress in treatment, Medication management, and Plan start Lipitor 10 mg/day.  Alexzandra Bilton Tresea Mall, DO 09/27/2023, 1:10 PM

## 2023-09-28 DIAGNOSIS — F323 Major depressive disorder, single episode, severe with psychotic features: Secondary | ICD-10-CM | POA: Diagnosis not present

## 2023-09-28 MED ORDER — FLUOXETINE HCL 10 MG PO CAPS
10.0000 mg | ORAL_CAPSULE | Freq: Once | ORAL | Status: AC
  Administered 2023-09-28: 10 mg via ORAL
  Filled 2023-09-28: qty 1

## 2023-09-28 MED ORDER — FLUOXETINE HCL 20 MG PO CAPS
30.0000 mg | ORAL_CAPSULE | Freq: Every day | ORAL | Status: DC
  Administered 2023-09-29: 30 mg via ORAL
  Filled 2023-09-28: qty 1

## 2023-09-28 NOTE — Group Note (Unsigned)
Date:  09/28/2023 Time:  2:26 AM  Group Topic/Focus:  Wrap-Up Group:   The focus of this group is to help patients review their daily goal of treatment and discuss progress on daily workbooks.     Participation Level:  {BHH PARTICIPATION NFAOZ:30865}  Participation Quality:  {BHH PARTICIPATION QUALITY:22265}  Affect:  {BHH AFFECT:22266}  Cognitive:  {BHH COGNITIVE:22267}  Insight: {BHH Insight2:20797}  Engagement in Group:  {BHH ENGAGEMENT IN HQION:62952}  Modes of Intervention:  {BHH MODES OF INTERVENTION:22269}  Additional Comments:  ***  Lenore Cordia 09/28/2023, 2:26 AM

## 2023-09-28 NOTE — Plan of Care (Signed)
Patient rated his depression 6/10 and anxiety 5/10. Patients goal for today is " getting my blood pressure under control." Patient denies SI,HI and VH. Patient states that he is hearing voice at times to hurt people. Patient states " I not going to do that." Patient stated that his anxiety and depression getting better. Patient visible in the milieu.Appropriate with staff & peers. Appetite and energy  level good. Support and encouragement given.

## 2023-09-28 NOTE — Group Note (Signed)
Date:  09/28/2023 Time:  9:40 PM  Group Topic/Focus:  Building Self Esteem:   The Focus of this group is helping patients become aware of the effects of self-esteem on their lives, the things they and others do that enhance or undermine their self-esteem, seeing the relationship between their level of self-esteem and the choices they make and learning ways to enhance self-esteem.    Participation Level:  Active  Participation Quality:  Appropriate and Attentive  Affect:  Appropriate  Cognitive:  Alert and Appropriate  Insight: Appropriate and Good  Engagement in Group:  Developing/Improving  Modes of Intervention:  Clarification, Discussion, and Support  Additional Comments:     David Salinas 09/28/2023, 9:40 PM

## 2023-09-28 NOTE — Group Note (Signed)
Date:  09/28/2023 Time:  1:22 PM  Group Topic/Focus:  Goals Group:   The focus of this group is to help patients establish daily goals to achieve during treatment and discuss how the patient can incorporate goal setting into their daily lives to aide in recovery. Group community meeting    Participation Level:  Active  Participation Quality:  Appropriate  Affect:  Appropriate  Cognitive:  Appropriate  Insight: Appropriate  Engagement in Group:  Developing/Improving and Engaged  Modes of Intervention:  Activity, Discussion, and Education  Additional Comments:    [name] 09/28/2023, 1:22 PM

## 2023-09-28 NOTE — Group Note (Signed)
Date:  09/28/2023 Time:  6:09 PM  Group Topic/Focus:  OUTDOOR RECREATION STRUCTURED ACTIVITY    Participation Level:  Did Not Attend   Tajae Maiolo 09/28/2023, 6:09 PM

## 2023-09-28 NOTE — Progress Notes (Signed)
[facility] MD Progress Note  09/28/2023 12:12 PM David Salinas  MRN:  [id number] Subjective: Reason is seen on rounds.  He asked me about his hemoglobin A1c which is 5.9 which should be under 5.6.  Started him on Lipitor yesterday for high LDL and low HDL.  He is doing better on the higher dose of the Risperdal and I think we can go up on his Prozac.  He still has intrusive thoughts. Principal Problem: Major depressive disorder, single episode, severe with psychotic features (HCC) Diagnosis: Principal Problem:   Major depressive disorder, single episode, severe with psychotic features (HCC) Active Problems:   Suicidal ideation   Generalized anxiety disorder with panic attacks  Total Time spent with patient: 15 minutes  Past Psychiatric History: OCD, depression, GAD  Past Medical History:  Past Medical History:  Diagnosis Date   Anxiety    Asthma    Chronic bronchitis (HCC)    Panic attacks     Past Surgical History:  Procedure Laterality Date   TONSILLECTOMY     Family History: History reviewed. No pertinent family history. Family Psychiatric  History: Unremarkable Social History:  Social History   Substance and Sexual Activity  Alcohol Use Not Currently     Social History   Substance and Sexual Activity  Drug Use No    Social History   Socioeconomic History   Marital status: Legally Separated    Spouse name: Not on file   Number of children: Not on file   Years of education: Not on file   Highest education level: Not on file  Occupational History   Not on file  Tobacco Use   Smoking status: Former    Current packs/day: 0.50    Types: Cigarettes   Smokeless tobacco: Never  Vaping Use   Vaping status: Never Used  Substance and Sexual Activity   Alcohol use: Not Currently   Drug use: No   Sexual activity: Not on file  Other Topics Concern   Not on file  Social History Narrative   Not on file   Social Determinants of Health   Financial Resource Strain:  Not on file  Food Insecurity: Food Insecurity Present (09/24/2023)   Hunger Vital Sign    Worried About Running Out of Food in the Last Year: Sometimes true    Ran Out of Food in the Last Year: Sometimes true  Transportation Needs: No Transportation Needs (09/24/2023)   PRAPARE - Administrator, Civil Service (Medical): No    Lack of Transportation (Non-Medical): No  Physical Activity: Not on file  Stress: Not on file  Social Connections: Not on file   Additional Social History:                         Sleep: Good  Appetite:  Good  Current Medications: Current Facility-Administered Medications  Medication Dose Route Frequency Provider Last Rate Last Admin   acetaminophen (TYLENOL) tablet 650 mg  650 mg Oral Q6H PRN Lauree Chandler, NP       alum & mag hydroxide-simeth (MAALOX/MYLANTA) 200-200-20 MG/5ML suspension 30 mL  30 mL Oral Q4H PRN Lauree Chandler, NP   30 mL at 09/25/23 1433   atorvastatin (LIPITOR) tablet 10 mg  10 mg Oral Daily Sarina Ill, DO   10 mg at 09/28/23 0817   haloperidol (HALDOL) tablet 5 mg  5 mg Oral TID PRN Lauree Chandler, NP  And   diphenhydrAMINE (BENADRYL) capsule 50 mg  50 mg Oral TID PRN Lauree Chandler, NP       doxepin (SINEQUAN) capsule 50 mg  50 mg Oral QHS Sarina Ill, DO   50 mg at 09/27/23 2127   FLUoxetine (PROZAC) capsule 20 mg  20 mg Oral Daily Sarina Ill, DO   20 mg at 09/28/23 1610   gabapentin (NEURONTIN) capsule 100 mg  100 mg Oral TID Charm Rings, NP   100 mg at 09/28/23 0818   hydrOXYzine (ATARAX) tablet 75 mg  75 mg Oral TID PRN Lauree Chandler, NP   75 mg at 09/25/23 2057   loperamide (IMODIUM) capsule 2 mg  2 mg Oral PRN Sarina Ill, DO   2 mg at 09/25/23 1724   losartan (COZAAR) tablet 100 mg  100 mg Oral QPC breakfast Sarina Ill, DO   100 mg at 09/28/23 0818   magnesium hydroxide (MILK OF MAGNESIA) suspension 30 mL  30 mL  Oral Daily PRN Lauree Chandler, NP   30 mL at 09/27/23 1303   metoprolol succinate (TOPROL-XL) 24 hr tablet 50 mg  50 mg Oral Daily Sarina Ill, DO   50 mg at 09/28/23 0817   risperiDONE (RISPERDAL) tablet 1 mg  1 mg Oral BH-q8a4p Sarina Ill, DO   1 mg at 09/28/23 9604   traZODone (DESYREL) tablet 100 mg  100 mg Oral QHS PRN Sarina Ill, DO   100 mg at 09/27/23 2127    Lab Results:  Results for orders placed or performed during the hospital encounter of 09/24/23 (from the past 48 hour(s))  Hemoglobin A1c     Status: Abnormal   Collection Time: 09/27/23  8:10 AM  Result Value Ref Range   Hgb A1c MFr Bld 5.9 (H) 4.8 - 5.6 %    Comment: (NOTE) Pre diabetes:          5.7%-6.4%  Diabetes:              >6.4%  Glycemic control for   <7.0% adults with diabetes    Mean Plasma Glucose 122.63 mg/dL    Comment: Performed at [facility], [address], [place], [place] [address]    Blood Alcohol level:  No results found for: "ETH"  Metabolic Disorder Labs: Lab Results  Component Value Date   HGBA1C 5.9 (H) 09/27/2023   MPG 122.63 09/27/2023   MPG 100 03/16/2009   No results found for: "PROLACTIN" Lab Results  Component Value Date   CHOL 200 09/26/2023   TRIG 118 09/26/2023   HDL 23 (L) 09/26/2023   CHOLHDL 8.7 09/26/2023   VLDL 24 09/26/2023   LDLCALC 153 (H) 09/26/2023   LDLCALC (H) 03/16/2009    113        Total Cholesterol/HDL:CHD Risk Coronary Heart Disease Risk Table                     Men   Women  1/2 Average Risk   3.4   3.3  Average Risk       5.0   4.4  2 X Average Risk   9.6   7.1  3 X Average Risk  23.4   11.0        Use the calculated Patient Ratio above and the CHD Risk Table to determine the patient's CHD Risk.        ATP III CLASSIFICATION (LDL):  <100  mg/dL   Optimal  474-259  mg/dL   Near or Above                    Optimal  130-159  mg/dL   Borderline  563-875  mg/dL   High  >643     mg/dL   Very  High    Physical Findings: AIMS:  , ,  ,  ,    CIWA:    COWS:     Musculoskeletal: Strength & Muscle Tone: within normal limits Gait & Station: normal Patient leans: N/A  Psychiatric Specialty Exam:  Presentation  General Appearance:  Casual  Eye Contact: Fair  Speech: Clear and Coherent  Speech Volume: Decreased  Handedness: Right   Mood and Affect  Mood: Depressed; Anxious  Affect: Congruent   Thought Process  Thought Processes: Coherent  Descriptions of Associations:Intact  Orientation:Full (Time, Place and Person)  Thought Content:Paranoid Ideation  History of Schizophrenia/Schizoaffective disorder:No  Duration of Psychotic Symptoms:Less than six months  Hallucinations:No data recorded Ideas of Reference:None  Suicidal Thoughts:No data recorded Homicidal Thoughts:No data recorded  Sensorium  Memory: Immediate Fair; Recent Fair; Remote Fair  Judgment: Fair  Insight: Fair   Art therapist  Concentration: Fair  Attention Span: Fair  Recall: Good  Fund of Knowledge: Good  Language: Good   Psychomotor Activity  Psychomotor Activity:No data recorded  Assets  Assets: Communication Skills; Desire for Improvement; Housing; Leisure Time   Sleep  Sleep:No data recorded    Blood pressure 115/69, pulse 79, temperature 98.1 F (36.7 C), temperature source Oral, resp. rate 16, height 5\' 6"  (1.676 m), weight 86.6 kg, SpO2 98%. Body mass index is 30.83 kg/m.   Treatment Plan Summary: Daily contact with patient to assess and evaluate symptoms and progress in treatment, Medication management, and Plan increase Prozac to 30 mg/day.  Sarina Ill, DO 09/28/2023, 12:12 PM

## 2023-09-28 NOTE — Plan of Care (Signed)
  Problem: Education: Goal: Emotional status will improve Outcome: Progressing   Problem: Education: Goal: Knowledge of [facility] Education information/materials will improve Outcome: Progressing

## 2023-09-29 DIAGNOSIS — F323 Major depressive disorder, single episode, severe with psychotic features: Secondary | ICD-10-CM | POA: Diagnosis not present

## 2023-09-29 MED ORDER — FLUOXETINE HCL 20 MG PO CAPS
40.0000 mg | ORAL_CAPSULE | Freq: Every day | ORAL | Status: DC
  Administered 2023-09-30 – 2023-10-03 (×4): 40 mg via ORAL
  Filled 2023-09-29 (×5): qty 2

## 2023-09-29 MED ORDER — RISPERIDONE 1 MG PO TABS
2.0000 mg | ORAL_TABLET | ORAL | Status: DC
  Administered 2023-09-29 – 2023-10-06 (×14): 2 mg via ORAL
  Filled 2023-09-29 (×14): qty 2

## 2023-09-29 NOTE — Plan of Care (Signed)
  Problem: Education: Goal: Verbalization of understanding the information provided will improve Outcome: Progressing   Problem: Activity: Goal: Interest or engagement in activities will improve Outcome: Progressing   

## 2023-09-29 NOTE — Progress Notes (Signed)
   09/28/23 2000  Psych Admission Type (Psych Patients Only)  Admission Status Voluntary  Psychosocial Assessment  Patient Complaints Anxiety  Eye Contact Brief;Fair  Facial Expression Animated  Affect Anxious  Speech Rapid  Interaction Assertive  Motor Activity Slow  Appearance/Hygiene Unremarkable;Improved  Behavior Characteristics Cooperative;Appropriate to situation  Mood Preoccupied;Pleasant  Thought Process  Coherency WDL  Content WDL  Delusions None reported or observed  Perception UTA  Hallucination None reported or observed  Judgment Impaired  Confusion WDL  Danger to Self  Current suicidal ideation? Denies  Agreement Not to Harm Self Yes  Description of Agreement verbal  Danger to Others  Danger to Others None reported or observed   Patient alert and oriented x 4 affect is flat thoughts are organized, he denies SI/HI/AVH 15 minutes safety checks maintained will continue to monitor.

## 2023-09-29 NOTE — Group Note (Signed)
Date:  09/29/2023 Time:  10:09 PM  Group Topic/Focus:  Wrap-Up Group:   The focus of this group is to help patients review their daily goal of treatment and discuss progress on daily workbooks.    Participation Level:  Active  Participation Quality:  Appropriate and Attentive  Affect:  Appropriate  Cognitive:  Alert  Insight: Good  Engagement in Group:  Limited  Modes of Intervention:  Discussion  Additional Comments:     Maglione,Krystalle Pilkington E 09/29/2023, 10:09 PM

## 2023-09-29 NOTE — Group Note (Signed)
Date:  09/29/2023 Time:  5:48 PM  Group Topic/Focus:  Dimensions of Wellness:   The focus of this group is to introduce the topic of wellness and discuss the role each dimension of wellness plays in total health. Goals Group:   The focus of this group is to help patients establish daily goals to achieve during treatment and discuss how the patient can incorporate goal setting into their daily lives to aide in recovery.    Participation Level:  Active  Participation Quality:  Appropriate  Affect:  Appropriate  Cognitive:  Appropriate  Insight: Appropriate  Engagement in Group:  Developing/Improving  Modes of Intervention:  Activity, Discussion, and Education  Additional Comments:    [name] 09/29/2023, 5:48 PM

## 2023-09-29 NOTE — Group Note (Signed)
Recreation Therapy Group Note   Group Topic:Coping Skills  Group Date: 09/29/2023 Start Time: 1000 End Time: 1100 Facilitators: Rosina Lowenstein, LRT, CTRS Location:  Craft Room  Group Description: Mind Map.  Patient was provided a blank template of a diagram with 32 blank boxes in a tiered system, branching from the center (similar to a bubble chart). LRT directed patients to label the middle of the diagram "Coping Skills". LRT and patients then came up with 8 different coping skills as examples. Pt were directed to record their coping skills in the 2nd tier boxes closest to the center.  Patients would then share their coping skills with the group as LRT wrote them out. LRT gave a handout of 99 different coping skills at the end of group.   Goal Area(s) Addressed: Patients will be able to define "coping skills". Patient will identify new coping skills.  Patient will increase communication.   Affect/Mood: Blunted and Flat   Participation Level: Minimal   Participation Quality: Independent   Behavior: Alert   Speech/Thought Process: Coherent   Insight: Limited   Judgement: Limited   Modes of Intervention: Education and Worksheet   Patient Response to Interventions:  Disengaged   Education Outcome:  In group clarification offered    Clinical Observations/Individualized Feedback: Shem was present in the craft room. Pt did not complete worksheet or engage in group discussion.   Plan: Continue to engage patient in RT group sessions 2-3x/week.   Rosina Lowenstein, LRT, CTRS 09/29/2023 11:43 AM

## 2023-09-29 NOTE — Group Note (Signed)
LCSW Group Therapy Note  Group Date: 09/29/2023 Start Time: 1330 End Time: 1430   Type of Therapy and Topic:  Group Therapy - Healthy vs Unhealthy Coping Skills  Participation Level:  Did Not Attend   Description of Group The focus of this group was to determine what unhealthy coping techniques typically are used by group members and what healthy coping techniques would be helpful in coping with various problems. Patients were guided in becoming aware of the differences between healthy and unhealthy coping techniques. Patients were asked to identify 2-3 healthy coping skills they would like to learn to use more effectively.  Therapeutic Goals Patients learned that coping is what human beings do all day long to deal with various situations in their lives Patients defined and discussed healthy vs unhealthy coping techniques Patients identified their preferred coping techniques and identified whether these were healthy or unhealthy Patients determined 2-3 healthy coping skills they would like to become more familiar with and use more often. Patients provided support and ideas to each other   Summary of Patient Progress:   X   Therapeutic Modalities Cognitive Behavioral Therapy Motivational Interviewing  Harden Mo, Theresia Majors 09/29/2023  3:05 PM

## 2023-09-29 NOTE — Progress Notes (Signed)
[facility] MD Progress Note  09/29/2023 11:11 AM MAC DEFORE  MRN:  [id number] Subjective: David Salinas is seen on rounds.  He is still having intrusive thoughts and continues to count.  He does feel like the medication has been helpful.  I told him that the meds that he is on are at the correct meds but they usually need to be at a higher dose.  He is okay with going up on it.  He has been in good controls and compliant with medications and denies any side effects. Principal Problem: Major depressive disorder, single episode, severe with psychotic features (HCC) Diagnosis: Principal Problem:   Major depressive disorder, single episode, severe with psychotic features (HCC) Active Problems:   Suicidal ideation   Generalized anxiety disorder with panic attacks  Total Time spent with patient: 15 minutes  Past Psychiatric History: OCD, depression, GAD  Past Medical History:  Past Medical History:  Diagnosis Date   Anxiety    Asthma    Chronic bronchitis (HCC)    Panic attacks     Past Surgical History:  Procedure Laterality Date   TONSILLECTOMY     Family History: History reviewed. No pertinent family history. Family Psychiatric  History: Unremarkable Social History:  Social History   Substance and Sexual Activity  Alcohol Use Not Currently     Social History   Substance and Sexual Activity  Drug Use No    Social History   Socioeconomic History   Marital status: Legally Separated    Spouse name: Not on file   Number of children: Not on file   Years of education: Not on file   Highest education level: Not on file  Occupational History   Not on file  Tobacco Use   Smoking status: Former    Current packs/day: 0.50    Types: Cigarettes   Smokeless tobacco: Never  Vaping Use   Vaping status: Never Used  Substance and Sexual Activity   Alcohol use: Not Currently   Drug use: No   Sexual activity: Not on file  Other Topics Concern   Not on file  Social History Narrative    Not on file   Social Determinants of Health   Financial Resource Strain: Not on file  Food Insecurity: Food Insecurity Present (09/24/2023)   Hunger Vital Sign    Worried About Running Out of Food in the Last Year: Sometimes true    Ran Out of Food in the Last Year: Sometimes true  Transportation Needs: No Transportation Needs (09/24/2023)   PRAPARE - Administrator, Civil Service (Medical): No    Lack of Transportation (Non-Medical): No  Physical Activity: Not on file  Stress: Not on file  Social Connections: Not on file   Additional Social History:                         Sleep: Good  Appetite:  Good  Current Medications: Current Facility-Administered Medications  Medication Dose Route Frequency Provider Last Rate Last Admin   acetaminophen (TYLENOL) tablet 650 mg  650 mg Oral Q6H PRN Lauree Chandler, NP       alum & mag hydroxide-simeth (MAALOX/MYLANTA) 200-200-20 MG/5ML suspension 30 mL  30 mL Oral Q4H PRN Lauree Chandler, NP   30 mL at 09/25/23 1433   atorvastatin (LIPITOR) tablet 10 mg  10 mg Oral Daily Sarina Ill, DO   10 mg at 09/29/23 0839   haloperidol (HALDOL) tablet 5  mg  5 mg Oral TID PRN Lauree Chandler, NP       And   diphenhydrAMINE (BENADRYL) capsule 50 mg  50 mg Oral TID PRN Lauree Chandler, NP       doxepin (SINEQUAN) capsule 50 mg  50 mg Oral QHS Sarina Ill, DO   50 mg at 09/28/23 2109   [START ON 09/30/2023] FLUoxetine (PROZAC) capsule 40 mg  40 mg Oral Daily Sarina Ill, DO       gabapentin (NEURONTIN) capsule 100 mg  100 mg Oral TID Charm Rings, NP   100 mg at 09/29/23 4098   hydrOXYzine (ATARAX) tablet 75 mg  75 mg Oral TID PRN Lauree Chandler, NP   75 mg at 09/25/23 2057   loperamide (IMODIUM) capsule 2 mg  2 mg Oral PRN Sarina Ill, DO   2 mg at 09/25/23 1724   losartan (COZAAR) tablet 100 mg  100 mg Oral QPC breakfast Sarina Ill, DO   100 mg  at 09/29/23 0840   magnesium hydroxide (MILK OF MAGNESIA) suspension 30 mL  30 mL Oral Daily PRN Lauree Chandler, NP   30 mL at 09/27/23 1303   metoprolol succinate (TOPROL-XL) 24 hr tablet 50 mg  50 mg Oral Daily Sarina Ill, DO   50 mg at 09/29/23 1191   risperiDONE (RISPERDAL) tablet 2 mg  2 mg Oral BH-q8a4p Sarina Ill, DO       traZODone (DESYREL) tablet 100 mg  100 mg Oral QHS PRN Sarina Ill, DO   100 mg at 09/28/23 2109    Lab Results: No results found for this or any previous visit (from the past 48 hour(s)).  Blood Alcohol level:  No results found for: "ETH"  Metabolic Disorder Labs: Lab Results  Component Value Date   HGBA1C 5.9 (H) 09/27/2023   MPG 122.63 09/27/2023   MPG 100 03/16/2009   No results found for: "PROLACTIN" Lab Results  Component Value Date   CHOL 200 09/26/2023   TRIG 118 09/26/2023   HDL 23 (L) 09/26/2023   CHOLHDL 8.7 09/26/2023   VLDL 24 09/26/2023   LDLCALC 153 (H) 09/26/2023   LDLCALC (H) 03/16/2009    113        Total Cholesterol/HDL:CHD Risk Coronary Heart Disease Risk Table                     Men   Women  1/2 Average Risk   3.4   3.3  Average Risk       5.0   4.4  2 X Average Risk   9.6   7.1  3 X Average Risk  23.4   11.0        Use the calculated Patient Ratio above and the CHD Risk Table to determine the patient's CHD Risk.        ATP III CLASSIFICATION (LDL):  <100     mg/dL   Optimal  478-295  mg/dL   Near or Above                    Optimal  130-159  mg/dL   Borderline  621-308  mg/dL   High  >657     mg/dL   Very High    Physical Findings: AIMS:  , ,  ,  ,    CIWA:    COWS:     Musculoskeletal: Strength & Muscle Tone: within normal  limits Gait & Station: normal Patient leans: N/A  Psychiatric Specialty Exam:  Presentation  General Appearance:  Casual  Eye Contact: Fair  Speech: Clear and Coherent  Speech Volume: Decreased  Handedness: Right   Mood and  Affect  Mood: Depressed; Anxious  Affect: Congruent   Thought Process  Thought Processes: Coherent  Descriptions of Associations:Intact  Orientation:Full (Time, Place and Person)  Thought Content:Paranoid Ideation  History of Schizophrenia/Schizoaffective disorder:No  Duration of Psychotic Symptoms:Less than six months  Hallucinations:No data recorded Ideas of Reference:None  Suicidal Thoughts:No data recorded Homicidal Thoughts:No data recorded  Sensorium  Memory: Immediate Fair; Recent Fair; Remote Fair  Judgment: Fair  Insight: Fair   Art therapist  Concentration: Fair  Attention Span: Fair  Recall: Good  Fund of Knowledge: Good  Language: Good   Psychomotor Activity  Psychomotor Activity:No data recorded  Assets  Assets: Communication Skills; Desire for Improvement; Housing; Leisure Time   Sleep  Sleep:No data recorded    Blood pressure (!) 144/75, pulse 90, temperature 98.1 F (36.7 C), resp. rate 18, height 5\' 6"  (1.676 m), weight 86.6 kg, SpO2 99%. Body mass index is 30.83 kg/m.   Treatment Plan Summary: Daily contact with patient to assess and evaluate symptoms and progress in treatment, Medication management, and Plan increase Prozac and increase Risperdal.  Sarina Ill, DO 09/29/2023, 11:11 AM

## 2023-09-29 NOTE — Plan of Care (Addendum)
Patient visible in the milieu. Appropriate with staff & peers. Patient states that his anxiety is better but still have depression and hearing voice to hurt people at times. Patient states " my sexual urges are getting stronger and I am having them more often now." Patient denies SI,HI and AVH at this time. Appetite and energy level good. Support and encouragement given.

## 2023-09-29 NOTE — Group Note (Signed)
Date:  09/29/2023 Time:  5:55 PM  Group Topic/Focus:  Healthy Communication:   The focus of this group is to discuss communication, barriers to communication, as well as healthy ways to communicate with others. Outdoor recreation structured group activity    Participation Level:  Active  Participation Quality:  Appropriate  Affect:  Appropriate  Cognitive:  Appropriate  Insight: Appropriate  Engagement in Group:  Developing/Improving  Modes of Intervention:  Activity  Additional Comments:    David Salinas 09/29/2023, 5:55 PM

## 2023-09-30 DIAGNOSIS — F323 Major depressive disorder, single episode, severe with psychotic features: Secondary | ICD-10-CM | POA: Diagnosis not present

## 2023-09-30 MED ORDER — RISPERIDONE MICROSPHERES ER 50 MG IM SRER
25.0000 mg | INTRAMUSCULAR | Status: DC
  Administered 2023-09-30: 25 mg via INTRAMUSCULAR
  Filled 2023-09-30: qty 2

## 2023-09-30 NOTE — Progress Notes (Signed)
Patient is a voluntary admission to BMU for MDD with SI and HI ideations, with auditory command hallucinations.  Pt is calm, cooperative and walk around in the unit joining in with meals. Today he received his a Risperdal Consta 25 mg IM.No requests for PRN.  Will continue to monitor.

## 2023-09-30 NOTE — Group Note (Signed)
Recreation Therapy Group Note   Group Topic:Goal Setting  Group Date: 09/30/2023 Start Time: 1000 End Time: 1100 Facilitators: Rosina Lowenstein, LRT, CTRS Location:  Craft Room  Group Description: Product/process development scientist. Patients were given many different magazines, a glue stick, markers, and a piece of cardstock paper. LRT and pts discussed the importance of having goals in life. LRT and pts discussed the difference between short-term and long-term goals, as well as what a SMART goal is. LRT encouraged pts to create a vision board, with images they picked and then cut out with safety scissors from the magazine, for themselves, that capture their short and long-term goals. LRT encouraged pts to show and explain their vision board to the group.   Goal Area(s) Addressed:  Patient will gain knowledge of short vs. long term goals.  Patient will identify goals for themselves. Patient will practice setting SMART goals. Patient will verbalize their goals to LRT and peers.   Affect/Mood: Appropriate and Flat   Participation Level: Active   Participation Quality: Independent   Behavior: Appropriate, Calm, and Cooperative   Speech/Thought Process: Coherent   Insight: Fair   Judgement: Good   Modes of Intervention: Art and Education   Patient Response to Interventions:  Attentive, Engaged, and Receptive   Education Outcome:  Acknowledges education   Clinical Observations/Individualized Feedback: Grantley was active in their participation of session activities and group discussion. Pt identified "I want to eat better and go back to work" as his goals. Pt appropriately identified images to reflect these goals. Pt interacted well with LRT and peers duration of session.    Plan: Continue to engage patient in RT group sessions 2-3x/week.   Rosina Lowenstein, LRT, CTRS 09/30/2023 11:59 AM

## 2023-09-30 NOTE — BHH Counselor (Signed)
CSW contacted patient's father, Luxton Salonen, [phone].  Father reports that his daughter is the contact person. He reports that "she has updated you all on that".     Father reports concerns about websites that pt has visited.  He reports that the websites were related to medications.  He reports that patient was self-diagnosing.   He reports concerns that medications are not effective.  He reports that "his outbursts are triggered by his blood pressure medications".    He reports concerns that medications need to address the patient's blood pressure.   Penni Homans, MSW, LCSW 09/30/2023 10:12 AM

## 2023-09-30 NOTE — Group Note (Addendum)
[facility] LCSW Group Therapy Note   Group Date: 09/30/2023 Start Time: 1300 End Time: 1410  Type of Therapy/Topic:  Group Therapy:  Feelings about Diagnosis  Participation Level:  Active   Mood: Appropriate    Description of Group:    This group will allow patients to explore their thoughts and feelings about diagnoses they have received. Patients will be guided to explore their level of understanding and acceptance of these diagnoses. Facilitator will encourage patients to process their thoughts and feelings about the reactions of others to their diagnosis, and will guide patients in identifying ways to discuss their diagnosis with significant others in their lives. This group will be process-oriented, with patients participating in exploration of their own experiences as well as giving and receiving support and challenge from other group members.   Therapeutic Goals: 1. Patient will demonstrate understanding of diagnosis as evidence by identifying two or more symptoms of the disorder:  2. Patient will be able to express two feelings regarding the diagnosis 3. Patient will demonstrate ability to communicate their needs through discussion and/or role plays  Summary of Patient Progress: During group patient was forthcoming with information and offered insight and perspective to his peers. Patient was attentive and engaged appropriately in conversation. Patient shared his personal experiences which offered encouragement to peers. Patient was respectful and empathetic to peers. Patient did well adding to the positive group dynamic and receiving feedback from group facilitator and peers.    Therapeutic Modalities:   Cognitive Behavioral Therapy Brief Therapy Feelings Identification    Lowry Ram, LCSW

## 2023-09-30 NOTE — Progress Notes (Signed)
[facility] MD Progress Note  09/30/2023 5:19 PM David Salinas  MRN:  [id number] Subjective:  32yo caucasuan male  want to hurt someone when I go into the day room."Endorses homicidal ideation.Reports hearing command voices instructing him to harm others.Lying in bed with a blanket over his head, avoiding interaction with staff and peers.Patient appears withdrawn and avoids engagement with the environment. Blanket over head while lying in bed  Principal Problem: Major depressive disorder, single episode, severe with psychotic features (HCC) Diagnosis: Principal Problem:   Major depressive disorder, single episode, severe with psychotic features (HCC) Active Problems:   Suicidal ideation   Generalized anxiety disorder with panic attacks  Total Time spent with patient: 1 hour  Past Psychiatric History: Anxiety   Past Medical History:  Past Medical History:  Diagnosis Date   Anxiety    Asthma    Chronic bronchitis (HCC)    Panic attacks     Past Surgical History:  Procedure Laterality Date   TONSILLECTOMY     Family History: History reviewed. No pertinent family history. Family Psychiatric  History: none reported Social History:  Social History   Substance and Sexual Activity  Alcohol Use Not Currently     Social History   Substance and Sexual Activity  Drug Use No    Social History   Socioeconomic History   Marital status: Legally Separated    Spouse name: Not on file   Number of children: Not on file   Years of education: Not on file   Highest education level: Not on file  Occupational History   Not on file  Tobacco Use   Smoking status: Former    Current packs/day: 0.50    Types: Cigarettes   Smokeless tobacco: Never  Vaping Use   Vaping status: Never Used  Substance and Sexual Activity   Alcohol use: Not Currently   Drug use: No   Sexual activity: Not on file  Other Topics Concern   Not on file  Social History Narrative   Not on file   Social  Determinants of Health   Financial Resource Strain: Not on file  Food Insecurity: Food Insecurity Present (09/24/2023)   Hunger Vital Sign    Worried About Running Out of Food in the Last Year: Sometimes true    Ran Out of Food in the Last Year: Sometimes true  Transportation Needs: No Transportation Needs (09/24/2023)   PRAPARE - Administrator, Civil Service (Medical): No    Lack of Transportation (Non-Medical): No  Physical Activity: Not on file  Stress: Not on file  Social Connections: Not on file   Additional Social History:                         Sleep: Good  Appetite:  Good  Current Medications: Current Facility-Administered Medications  Medication Dose Route Frequency Provider Last Rate Last Admin   acetaminophen (TYLENOL) tablet 650 mg  650 mg Oral Q6H PRN Lauree Chandler, NP       alum & mag hydroxide-simeth (MAALOX/MYLANTA) 200-200-20 MG/5ML suspension 30 mL  30 mL Oral Q4H PRN Lauree Chandler, NP   30 mL at 09/25/23 1433   atorvastatin (LIPITOR) tablet 10 mg  10 mg Oral Daily Sarina Ill, DO   10 mg at 09/30/23 8295   haloperidol (HALDOL) tablet 5 mg  5 mg Oral TID PRN Lauree Chandler, NP       And   diphenhydrAMINE (  BENADRYL) capsule 50 mg  50 mg Oral TID PRN Lauree Chandler, NP       doxepin (SINEQUAN) capsule 50 mg  50 mg Oral QHS Sarina Ill, DO   50 mg at 09/29/23 2113   FLUoxetine (PROZAC) capsule 40 mg  40 mg Oral Daily Sarina Ill, DO   40 mg at 09/30/23 0102   gabapentin (NEURONTIN) capsule 100 mg  100 mg Oral TID Charm Rings, NP   100 mg at 09/30/23 1705   hydrOXYzine (ATARAX) tablet 75 mg  75 mg Oral TID PRN Lauree Chandler, NP   75 mg at 09/29/23 2114   loperamide (IMODIUM) capsule 2 mg  2 mg Oral PRN Sarina Ill, DO   2 mg at 09/25/23 1724   losartan (COZAAR) tablet 100 mg  100 mg Oral QPC breakfast Sarina Ill, DO   100 mg at 09/30/23 7253    magnesium hydroxide (MILK OF MAGNESIA) suspension 30 mL  30 mL Oral Daily PRN Lauree Chandler, NP   30 mL at 09/29/23 1225   metoprolol succinate (TOPROL-XL) 24 hr tablet 50 mg  50 mg Oral Daily Sarina Ill, DO   50 mg at 09/30/23 0827   risperiDONE (RISPERDAL) tablet 2 mg  2 mg Oral BH-q8a4p Sarina Ill, DO   2 mg at 09/30/23 1704   risperiDONE microspheres (RISPERDAL CONSTA) injection 25 mg  25 mg Intramuscular Q14 Days Myriam Forehand, NP   25 mg at 09/30/23 1714   traZODone (DESYREL) tablet 100 mg  100 mg Oral QHS PRN Sarina Ill, DO   100 mg at 09/29/23 2114    Lab Results: No results found for this or any previous visit (from the past 48 hour(s)).  Blood Alcohol level:  No results found for: "ETH"  Metabolic Disorder Labs: Lab Results  Component Value Date   HGBA1C 5.9 (H) 09/27/2023   MPG 122.63 09/27/2023   MPG 100 03/16/2009   No results found for: "PROLACTIN" Lab Results  Component Value Date   CHOL 200 09/26/2023   TRIG 118 09/26/2023   HDL 23 (L) 09/26/2023   CHOLHDL 8.7 09/26/2023   VLDL 24 09/26/2023   LDLCALC 153 (H) 09/26/2023   LDLCALC (H) 03/16/2009    113        Total Cholesterol/HDL:CHD Risk Coronary Heart Disease Risk Table                     Men   Women  1/2 Average Risk   3.4   3.3  Average Risk       5.0   4.4  2 X Average Risk   9.6   7.1  3 X Average Risk  23.4   11.0        Use the calculated Patient Ratio above and the CHD Risk Table to determine the patient's CHD Risk.        ATP III CLASSIFICATION (LDL):  <100     mg/dL   Optimal  664-403  mg/dL   Near or Above                    Optimal  130-159  mg/dL   Borderline  474-259  mg/dL   High  >563     mg/dL   Very High    Physical Findings: AIMS:  , ,  ,  ,    CIWA:    COWS:  Musculoskeletal: Strength & Muscle Tone: within normal limits Gait & Station: normal Patient leans: N/A  Psychiatric Specialty Exam:  Presentation  General  Appearance:  Casual  Eye Contact: Fair  Speech: Clear and Coherent  Speech Volume: Decreased  Handedness: Right   Mood and Affect  Mood: Depressed; Anxious  Affect: Congruent   Thought Process  Thought Processes: Coherent  Descriptions of Associations:Intact  Orientation:Full (Time, Place and Person)  Thought Content:Paranoid Ideation  History of Schizophrenia/Schizoaffective disorder:No  Duration of Psychotic Symptoms:Less than six months  Hallucinations:No data recorded Ideas of Reference:None  Suicidal Thoughts:No data recorded Homicidal Thoughts:No data recorded  Sensorium  Memory: Immediate Fair; Recent Fair; Remote Fair  Judgment: Fair  Insight: Fair   Art therapist  Concentration: Fair  Attention Span: Fair  Recall: Good  Fund of Knowledge: Good  Language: Good   Psychomotor Activity  Psychomotor Activity:No data recorded  Assets  Assets: Communication Skills; Desire for Improvement; Housing; Leisure Time   Sleep  Sleep:No data recorded   Physical Exam: Physical Exam Vitals and nursing note reviewed.  Constitutional:      Appearance: Normal appearance.  HENT:     Head: Normocephalic and atraumatic.     Nose: Nose normal.  Pulmonary:     Effort: Pulmonary effort is normal.  Musculoskeletal:        General: Normal range of motion.     Cervical back: Normal range of motion.  Neurological:     Mental Status: He is alert.  Psychiatric:        Attention and Perception: He is inattentive. He perceives auditory hallucinations.        Mood and Affect: Mood is anxious. Affect is flat and inappropriate.        Speech: Speech normal.        Behavior: Behavior is withdrawn. Behavior is cooperative.        Thought Content: Thought content normal.        Cognition and Memory: Cognition and memory normal.        Judgment: Judgment normal.    ROS Blood pressure 125/84, pulse 86, temperature 97.8 F (36.6 C),  temperature source Oral, resp. rate 17, height 5\' 6"  (1.676 m), weight 86.6 kg, SpO2 99%. Body mass index is 30.83 kg/m.   Treatment Plan Summary: Daily contact with patient to assess and evaluate symptoms and progress in treatment and Medication management Risperdal Consta 25 mg IM administered per provider order to manage homicidal ideation and psychosis Monitor for effectiveness of the medication and any side effects. Encourage the patient to verbalize concerns rather than act on impulses. Reassess homicidal ideation and response to medication within 24 hours. Monitor for any escalation in behavior or worsening of psychotic symptoms. Myriam Forehand, NP 09/30/2023, 5:19 PM

## 2023-09-30 NOTE — B[facility] Counselor (Signed)
Patient provided verbal permission for CSW to reach out to his father at fathers request, Dack Rivett, [phone].  Penni Homans, MSW, LCSW 09/30/2023 8:28 AM

## 2023-09-30 NOTE — Plan of Care (Signed)
  Problem: Health Behavior/Discharge Planning: Goal: Identification of resources available to assist in meeting health care needs will improve Outcome: Progressing   Problem: Coping: Goal: Ability to verbalize frustrations and anger appropriately will improve Outcome: Progressing   Problem: Activity: Goal: Interest or engagement in activities will improve Outcome: Progressing   Problem: Education: Goal: Knowledge of [place] [facility] Education information/materials will improve Outcome: Progressing Goal: Emotional status will improve Outcome: Progressing Goal: Verbalization of understanding the information provided will improve Outcome: Progressing

## 2023-09-30 NOTE — OR Nursing (Signed)
D- Patient alert and oriented. Denies SI, HI, AVH, and pain.

## 2023-09-30 NOTE — Group Note (Signed)
Date:  09/30/2023 Time:  10:32 PM  Group Topic/Focus:  Personal Choices and Values:   The focus of this group is to help patients assess and explore the importance of values in their lives, how their values affect their decisions, how they express their values and what opposes their expression.    Participation Level:  Active  Participation Quality:  Appropriate and Attentive  Affect:  Appropriate  Cognitive:  Alert and Appropriate  Insight: Appropriate  Engagement in Group:  Engaged and Improving  Modes of Intervention:  Activity, Clarification, Discussion, Rapport Building, Socialization, and Support  Additional Comments:     David Salinas 09/30/2023, 10:32 PM

## 2023-09-30 NOTE — Group Note (Signed)
Date:  09/30/2023 Time:  6:27 PM  Group Topic/Focus:  Healthy Communication:   The focus of this group is to discuss communication, barriers to communication, as well as healthy ways to communicate with others. Outdoor recreation structured activity    Participation Level:  Did Not Attend   [name] 09/30/2023, 6:27 PM

## 2023-09-30 NOTE — Group Note (Signed)
Date:  09/30/2023 Time:  1:52 PM  Group Topic/Focus:  Emotional Education:   The focus of this group is to discuss what feelings/emotions are, and how they are experienced. Recovery Goals:   The focus of this group is to identify appropriate goals for recovery and establish a plan to achieve them.    Participation Level:  Active  Participation Quality:  Appropriate  Affect:  Appropriate  Cognitive:  Appropriate  Insight: Appropriate  Engagement in Group:  Developing/Improving  Modes of Intervention:  Activity  Additional Comments:    David Salinas 09/30/2023, 1:52 PM

## 2023-10-01 DIAGNOSIS — F323 Major depressive disorder, single episode, severe with psychotic features: Secondary | ICD-10-CM | POA: Diagnosis not present

## 2023-10-01 NOTE — Group Note (Signed)
Date:  10/01/2023 Time:  10:19 PM  Group Topic/Focus:  Wrap-Up Group:   The focus of this group is to help patients review their daily goal of treatment and discuss progress on daily workbooks.    Participation Level:  Active  Participation Quality:  Appropriate  Affect:  Appropriate  Cognitive:  Appropriate  Insight: Appropriate  Engagement in Group:  Engaged  Modes of Intervention:  Discussion   Lenore Cordia 10/01/2023, 10:19 PM

## 2023-10-01 NOTE — Group Note (Signed)
Recreation Therapy Group Note   Group Topic:Emotion Expression  Group Date: 10/01/2023 Start Time: 1045 End Time: 1135 Facilitators: Rosina Lowenstein, LRT, CTRS Location:  Craft Room  Group Description: Gratitude Journaling. Patients and LRT discussed what gratitude means, how we can express it and what it means to [place], personally. LRT gave an education handout on the definition of gratitude that also gave different examples of gratitude exercises that they could try. One of the examples was "Gratitude Letter", which prompted patient to write a letter to someone they appreciate. LRT played soft music while everyone wrote their letter. Once letter was completed, LRT encouraged people to read their letter if they wanted to; or share who they wrote it to, at minimum. LRT and pts talked about the benefits of journaling and how it can be used as a positive coping skill. LRT and pts processed how showing gratitude towards themselves, and others can be applied to everyday life post-discharge. LRT offered journals to pts afterwards.   Goal Area(s) Addressed:  Patient will identify the definition of gratitude. Patient will learn different gratitude exercises. Patient will practice writing/journaling as a coping skill.  Patient will identify a new coping skill.   Affect/Mood: Appropriate   Participation Level: Active and Engaged   Participation Quality: Independent   Behavior: Appropriate, Calm, and Cooperative   Speech/Thought Process: Coherent   Insight: Good   Judgement: Good   Modes of Intervention: Activity, Education, and Writing   Patient Response to Interventions:  Attentive, Engaged, Interested , and Receptive   Education Outcome:  Acknowledges education   Clinical Observations/Individualized Feedback: David Salinas was active in their participation of session activities and group discussion. Pt identified "my sister" as who he wrote his letter to. Pt interacted well with LRT and  peers duration of session.    Plan: Continue to engage patient in RT group sessions 2-3x/week.   Rosina Lowenstein, LRT, CTRS 10/01/2023 12:10 PM

## 2023-10-01 NOTE — BH IP Treatment Plan (Signed)
Interdisciplinary Treatment and Diagnostic Plan Update  10/01/2023 Time of Session: 9:00AM David Salinas MRN: [id number]  Principal Diagnosis: Major depressive disorder, single episode, severe with psychotic features (HCC)  Secondary Diagnoses: Principal Problem:   Major depressive disorder, single episode, severe with psychotic features (HCC) Active Problems:   Suicidal ideation   Generalized anxiety disorder with panic attacks   Current Medications:  Current Facility-Administered Medications  Medication Dose Route Frequency Provider Last Rate Last Admin   acetaminophen (TYLENOL) tablet 650 mg  650 mg Oral Q6H PRN Lauree Chandler, NP       alum & mag hydroxide-simeth (MAALOX/MYLANTA) 200-200-20 MG/5ML suspension 30 mL  30 mL Oral Q4H PRN Lauree Chandler, NP   30 mL at 09/25/23 1433   atorvastatin (LIPITOR) tablet 10 mg  10 mg Oral Daily Sarina Ill, DO   10 mg at 10/01/23 2841   haloperidol (HALDOL) tablet 5 mg  5 mg Oral TID PRN Lauree Chandler, NP       And   diphenhydrAMINE (BENADRYL) capsule 50 mg  50 mg Oral TID PRN Lauree Chandler, NP       doxepin (SINEQUAN) capsule 50 mg  50 mg Oral QHS Sarina Ill, DO   50 mg at 09/30/23 2102   FLUoxetine (PROZAC) capsule 40 mg  40 mg Oral Daily Sarina Ill, DO   40 mg at 10/01/23 0825   gabapentin (NEURONTIN) capsule 100 mg  100 mg Oral TID Charm Rings, NP   100 mg at 10/01/23 3244   hydrOXYzine (ATARAX) tablet 75 mg  75 mg Oral TID PRN Lauree Chandler, NP   75 mg at 09/29/23 2114   loperamide (IMODIUM) capsule 2 mg  2 mg Oral PRN Sarina Ill, DO   2 mg at 09/25/23 1724   losartan (COZAAR) tablet 100 mg  100 mg Oral QPC breakfast Sarina Ill, DO   100 mg at 10/01/23 0102   magnesium hydroxide (MILK OF MAGNESIA) suspension 30 mL  30 mL Oral Daily PRN Lauree Chandler, NP   30 mL at 09/29/23 1225   metoprolol succinate (TOPROL-XL) 24 hr tablet 50 mg  50  mg Oral Daily Sarina Ill, DO   50 mg at 10/01/23 0826   risperiDONE (RISPERDAL) tablet 2 mg  2 mg Oral BH-q8a4p Sarina Ill, DO   2 mg at 10/01/23 7253   risperiDONE microspheres (RISPERDAL CONSTA) injection 25 mg  25 mg Intramuscular Q14 Days Myriam Forehand, NP   25 mg at 09/30/23 1714   traZODone (DESYREL) tablet 100 mg  100 mg Oral QHS PRN Sarina Ill, DO   100 mg at 09/30/23 2102   PTA Medications: Medications Prior to Admission  Medication Sig Dispense Refill Last Dose   albuterol (VENTOLIN HFA) 108 (90 Base) MCG/ACT inhaler Inhale 1-2 puffs into the lungs every 6 (six) hours as needed for wheezing or shortness of breath. 1 each 0    hydrOXYzine (ATARAX) 25 MG tablet Take 1 tablet (25 mg total) by mouth 3 (three) times daily as needed. 30 tablet 0     Patient Stressors: Financial difficulties   Health problems   Occupational concerns    Patient Strengths: Average or above average intelligence  Communication skills  Physical Health  Supportive family/friends   Treatment Modalities: Medication Management, Group therapy, Case management,  1 to 1 session with clinician, Psychoeducation, Recreational therapy.   Physician Treatment Plan for Primary Diagnosis: Major depressive  disorder, single episode, severe with psychotic features (HCC) Long Term Goal(s): Improvement in symptoms so as ready for discharge   Short Term Goals: Ability to identify changes in lifestyle to reduce recurrence of condition will improve Ability to verbalize feelings will improve Ability to disclose and discuss suicidal ideas Ability to demonstrate self-control will improve Ability to identify and develop effective coping behaviors will improve Ability to maintain clinical measurements within normal limits will improve Compliance with prescribed medications will improve Ability to identify triggers associated with substance abuse/mental health issues will  improve  Medication Management: Evaluate patient's response, side effects, and tolerance of medication regimen.  Therapeutic Interventions: 1 to 1 sessions, Unit Group sessions and Medication administration.  Evaluation of Outcomes: Progressing  Physician Treatment Plan for Secondary Diagnosis: Principal Problem:   Major depressive disorder, single episode, severe with psychotic features (HCC) Active Problems:   Suicidal ideation   Generalized anxiety disorder with panic attacks  Long Term Goal(s): Improvement in symptoms so as ready for discharge   Short Term Goals: Ability to identify changes in lifestyle to reduce recurrence of condition will improve Ability to verbalize feelings will improve Ability to disclose and discuss suicidal ideas Ability to demonstrate self-control will improve Ability to identify and develop effective coping behaviors will improve Ability to maintain clinical measurements within normal limits will improve Compliance with prescribed medications will improve Ability to identify triggers associated with substance abuse/mental health issues will improve     Medication Management: Evaluate patient's response, side effects, and tolerance of medication regimen.  Therapeutic Interventions: 1 to 1 sessions, Unit Group sessions and Medication administration.  Evaluation of Outcomes: Progressing   RN Treatment Plan for Primary Diagnosis: Major depressive disorder, single episode, severe with psychotic features (HCC) Long Term Goal(s): Knowledge of disease and therapeutic regimen to maintain health will improve  Short Term Goals: Ability to demonstrate self-control, Ability to participate in decision making will improve, Ability to verbalize feelings will improve, Ability to disclose and discuss suicidal ideas, Ability to identify and develop effective coping behaviors will improve, and Compliance with prescribed medications will improve  Medication Management:  RN will administer medications as ordered by provider, will assess and evaluate patient's response and provide education to patient for prescribed medication. RN will report any adverse and/or side effects to prescribing provider.  Therapeutic Interventions: 1 on 1 counseling sessions, Psychoeducation, Medication administration, Evaluate responses to treatment, Monitor vital signs and CBGs as ordered, Perform/monitor CIWA, COWS, AIMS and Fall Risk screenings as ordered, Perform wound care treatments as ordered.  Evaluation of Outcomes: Progressing   LCSW Treatment Plan for Primary Diagnosis: Major depressive disorder, single episode, severe with psychotic features (HCC) Long Term Goal(s): Safe transition to appropriate next level of care at discharge, Engage patient in therapeutic group addressing interpersonal concerns.  Short Term Goals: Engage patient in aftercare planning with referrals and resources, Increase social support, Increase ability to appropriately verbalize feelings, Increase emotional regulation, Facilitate acceptance of mental health diagnosis and concerns, and Increase skills for wellness and recovery  Therapeutic Interventions: Assess for all discharge needs, 1 to 1 time with Social worker, Explore available resources and support systems, Assess for adequacy in community support network, Educate family and significant other(s) on suicide prevention, Complete Psychosocial Assessment, Interpersonal group therapy.  Evaluation of Outcomes: Progressing   Progress in Treatment: Attending groups: Yes. Participating in groups: Yes. Taking medication as prescribed: Yes. Toleration medication: Yes. Family/Significant other contact made: Yes, individual(s) contacted:  SPE completed with the patient's sister  Patient understands diagnosis: Yes. Discussing patient identified problems/goals with staff: No. Medical problems stabilized or resolved: Yes. Denies suicidal/homicidal  ideation: Yes. Issues/concerns per patient self-inventory: No. Other: none  New problem(s) identified: No, Describe:  none  Update 10/01/2023:  No changes at this time.    New Short Term/Long Term Goal(s): detox, elimination of symptoms of psychosis, medication management for mood stabilization; elimination of SI thoughts; development of comprehensive mental wellness/sobriety plan.  Update 10/01/2023:  No changes at this time.    Patient Goals:  Patient declined to attend treatment team Update 10/01/2023:  No changes at this time.    Discharge Plan or Barriers: CSW to assist patient in development of appropriate discharge plans Update 10/01/2023:  CSW to assist with development of appropriate discharge plans.  Patient reports plans to follow up with aftercare plans.  Reason for Continuation of Hospitalization: Anxiety Depression Homicidal ideation Medication stabilization Suicidal ideation  Estimated Length of Stay: 1-7 days Update 10/01/2023:  TBD   Last 3 [place] Suicide Severity Risk Score: Flowsheet Row Admission (Current) from 09/24/2023 in [facility] INPAT[facility]ICINE ED from 09/23/2023 in [facility] Emergency Department at [facility] ED from 09/21/2023 in [facility] Emergency Department at [facility]  C-SSRS RISK CATEGORY High Risk High Risk No Risk       Last PHQ 2/9 Scores:     No data to display          Scribe for Treatment Team: Harden Mo, LCSW 10/01/2023 12:05 PM

## 2023-10-01 NOTE — Plan of Care (Signed)
  Problem: Education: Goal: Knowledge of [place] [facility] Education information/materials will improve Outcome: Not Progressing Goal: Emotional status will improve Outcome: Not Progressing Goal: Mental status will improve Outcome: Not Progressing Goal: Verbalization of understanding the information provided will improve Outcome: Not Progressing   Problem: Activity: Goal: Interest or engagement in activities will improve Outcome: Not Progressing Goal: Sleeping patterns will improve Outcome: Not Progressing   Problem: Coping: Goal: Ability to verbalize frustrations and anger appropriately will improve Outcome: Not Progressing Goal: Ability to demonstrate self-control will improve Outcome: Not Progressing   Problem: Health Behavior/Discharge Planning: Goal: Identification of resources available to assist in meeting health care needs will improve Outcome: Not Progressing Goal: Compliance with treatment plan for underlying cause of condition will improve Outcome: Not Progressing   Problem: Physical Regulation: Goal: Ability to maintain clinical measurements within normal limits will improve Outcome: Not Progressing   Problem: Safety: Goal: Periods of time without injury will increase Outcome: Not Progressing   Problem: Education: Goal: Ability to state activities that reduce stress will improve Outcome: Not Progressing   Problem: Coping: Goal: Ability to identify and develop effective coping behavior will improve Outcome: Not Progressing   Problem: Self-Concept: Goal: Ability to identify factors that promote anxiety will improve Outcome: Not Progressing Goal: Level of anxiety will decrease Outcome: Not Progressing   Problem: Education: Goal: Knowledge of General Education information will improve Description: Including pain rating scale, medication(s)/side effects and non-pharmacologic comfort measures Outcome: Not Progressing   Problem: Health Behavior/Discharge  Planning: Goal: Ability to manage health-related needs will improve Outcome: Not Progressing   Problem: Clinical Measurements: Goal: Ability to maintain clinical measurements within normal limits will improve Outcome: Not Progressing Goal: Will remain free from infection Outcome: Not Progressing Goal: Diagnostic test results will improve Outcome: Not Progressing Goal: Respiratory complications will improve Outcome: Not Progressing Goal: Cardiovascular complication will be avoided Outcome: Not Progressing   Problem: Activity: Goal: Risk for activity intolerance will decrease Outcome: Not Progressing   Problem: Nutrition: Goal: Adequate nutrition will be maintained Outcome: Not Progressing   Problem: Coping: Goal: Level of anxiety will decrease Outcome: Not Progressing   Problem: Elimination: Goal: Will not experience complications related to bowel motility Outcome: Not Progressing Goal: Will not experience complications related to urinary retention Outcome: Not Progressing   Problem: Pain Management: Goal: General experience of comfort will improve Outcome: Not Progressing   Problem: Safety: Goal: Ability to remain free from injury will improve Outcome: Not Progressing   Problem: Skin Integrity: Goal: Risk for impaired skin integrity will decrease Outcome: Not Progressing

## 2023-10-01 NOTE — Group Note (Signed)
Date:  10/01/2023 Time:  10:09 AM  Group Topic/Focus:  Goals Group:   The focus of this group is to help patients establish daily goals to achieve during treatment and discuss how the patient can incorporate goal setting into their daily lives to aide in recovery.    Participation Level:  Active  Participation Quality:  Appropriate  Affect:  Appropriate  Cognitive:  Appropriate  Insight: Appropriate  Engagement in Group:  Engaged  Modes of Intervention:  Discussion, Education, and Support  Additional Comments:    Wilford Corner 10/01/2023, 10:09 AM

## 2023-10-01 NOTE — Progress Notes (Signed)
[facility] MD Progress Note  10/04/2023 10:21 AM David Salinas  MRN:  [id number] Subjective:  32year-old Caucasian male stated, "I feel a lot better today." He reports no suicidal ideation (SI), homicidal ideation (HI), delusions, or hallucinations.Patient observed participating in group activities and interacting with staff and other patients. Principal Problem: Major depressive disorder, single episode, severe with psychotic features (HCC) Diagnosis: Principal Problem:   Major depressive disorder, single episode, severe with psychotic features (HCC) Active Problems:   Suicidal ideation   Generalized anxiety disorder with panic attacks  Total Time spent with patient: 45 minutes  Past Psychiatric History:  Anxiety   Past Medical History:  Past Medical History:  Diagnosis Date   Anxiety    Asthma    Chronic bronchitis (HCC)    Panic attacks     Past Surgical History:  Procedure Laterality Date   TONSILLECTOMY     Family History: History reviewed. No pertinent family history. Family Psychiatric  History: none reported Social History:  Social History   Substance and Sexual Activity  Alcohol Use Not Currently     Social History   Substance and Sexual Activity  Drug Use No    Social History   Socioeconomic History   Marital status: Legally Separated    Spouse name: Not on file   Number of children: Not on file   Years of education: Not on file   Highest education level: Not on file  Occupational History   Not on file  Tobacco Use   Smoking status: Former    Current packs/day: 0.50    Types: Cigarettes   Smokeless tobacco: Never  Vaping Use   Vaping status: Never Used  Substance and Sexual Activity   Alcohol use: Not Currently   Drug use: No   Sexual activity: Not on file  Other Topics Concern   Not on file  Social History Narrative   Not on file   Social Determinants of Health   Financial Resource Strain: Not on file  Food Insecurity: Food Insecurity  Present (09/24/2023)   Hunger Vital Sign    Worried About Running Out of Food in the Last Year: Sometimes true    Ran Out of Food in the Last Year: Sometimes true  Transportation Needs: No Transportation Needs (09/24/2023)   PRAPARE - Administrator, Civil Service (Medical): No    Lack of Transportation (Non-Medical): No  Physical Activity: Not on file  Stress: Not on file  Social Connections: Not on file   Additional Social History:                         Sleep: Good  Appetite:  Good  Current Medications: Current Facility-Administered Medications  Medication Dose Route Frequency Provider Last Rate Last Admin   acetaminophen (TYLENOL) tablet 650 mg  650 mg Oral Q6H PRN Lauree Chandler, NP       alum & mag hydroxide-simeth (MAALOX/MYLANTA) 200-200-20 MG/5ML suspension 30 mL  30 mL Oral Q4H PRN Lauree Chandler, NP   30 mL at 10/01/23 2005   atorvastatin (LIPITOR) tablet 10 mg  10 mg Oral Daily Sarina Ill, DO   10 mg at 10/04/23 4098   haloperidol (HALDOL) tablet 5 mg  5 mg Oral TID PRN Lauree Chandler, NP       And   diphenhydrAMINE (BENADRYL) capsule 50 mg  50 mg Oral TID PRN Lauree Chandler, NP  doxepin (SINEQUAN) capsule 50 mg  50 mg Oral QHS Sarina Ill, DO   50 mg at 10/03/23 2126   FLUoxetine (PROZAC) capsule 60 mg  60 mg Oral Daily Myriam Forehand, NP   60 mg at 10/04/23 1610   gabapentin (NEURONTIN) capsule 200 mg  200 mg Oral TID Myriam Forehand, NP   200 mg at 10/04/23 0840   hydrOXYzine (ATARAX) tablet 75 mg  75 mg Oral TID PRN Lauree Chandler, NP   75 mg at 10/01/23 2137   loperamide (IMODIUM) capsule 2 mg  2 mg Oral PRN Sarina Ill, DO   2 mg at 09/25/23 1724   losartan (COZAAR) tablet 100 mg  100 mg Oral QPC breakfast Sarina Ill, DO   100 mg at 10/04/23 9604   magnesium hydroxide (MILK OF MAGNESIA) suspension 30 mL  30 mL Oral Daily PRN Lauree Chandler, NP   30 mL at 10/02/23  1017   metoprolol succinate (TOPROL-XL) 24 hr tablet 50 mg  50 mg Oral Daily Sarina Ill, DO   50 mg at 10/04/23 0840   risperiDONE (RISPERDAL) tablet 2 mg  2 mg Oral BH-q8a4p Sarina Ill, DO   2 mg at 10/04/23 5409   risperiDONE microspheres (RISPERDAL CONSTA) injection 25 mg  25 mg Intramuscular Q14 Days Myriam Forehand, NP   25 mg at 09/30/23 1714   traZODone (DESYREL) tablet 100 mg  100 mg Oral QHS PRN Sarina Ill, DO   100 mg at 10/03/23 2126      Metabolic Disorder Labs: Lab Results  Component Value Date   HGBA1C 5.9 (H) 09/27/2023   MPG 122.63 09/27/2023   MPG 100 03/16/2009   No results found for: "PROLACTIN" Lab Results  Component Value Date   CHOL 200 09/26/2023   TRIG 118 09/26/2023   HDL 23 (L) 09/26/2023   CHOLHDL 8.7 09/26/2023   VLDL 24 09/26/2023   LDLCALC 153 (H) 09/26/2023   LDLCALC (H) 03/16/2009    113        Total Cholesterol/HDL:CHD Risk Coronary Heart Disease Risk Table                     Men   Women  1/2 Average Risk   3.4   3.3  Average Risk       5.0   4.4  2 X Average Risk   9.6   7.1  3 X Average Risk  23.4   11.0        Use the calculated Patient Ratio above and the CHD Risk Table to determine the patient's CHD Risk.        ATP III CLASSIFICATION (LDL):  <100     mg/dL   Optimal  811-914  mg/dL   Near or Above                    Optimal  130-159  mg/dL   Borderline  782-956  mg/dL   High  >213     mg/dL   Very High     Musculoskeletal: Strength & Muscle Tone: within normal limits Gait & Station: normal Patient leans: N/A  Psychiatric Specialty Exam:  Presentation  General Appearance:  Appropriate for Environment  Eye Contact: Poor  Speech: Clear and Coherent; Normal Rate  Speech Volume: Normal  Handedness: Right   Mood and Affect  Mood: Anxious  Affect: Flat   Thought Process  Thought Processes: Disorganized  Descriptions of Associations:Intact  Orientation:Full  (Time, Place and Person)  Thought Content:Rumination  History of Schizophrenia/Schizoaffective disorder:No  Duration of Psychotic Symptoms:Less than six months  Hallucinations:No data recorded  Ideas of Reference:None  Suicidal Thoughts:denies  Homicidal Thoughts:denies   Sensorium  Memory: Immediate Good; Remote Good  Judgment: Poor  Insight: Poor   Executive Functions  Concentration: Fair  Attention Span: Fair  Recall: Good  Fund of Knowledge: Good  Language: Good   Psychomotor Activity  Psychomotor Activity: No data recorded   Assets  Assets: Housing; Social Support; Communication Skills   Sleep  Sleep: No data recorded    Physical Exam: Physical Exam Vitals and nursing note reviewed.  Constitutional:      Appearance: Normal appearance.  HENT:     Head: Normocephalic and atraumatic.     Nose: Nose normal.  Pulmonary:     Effort: Pulmonary effort is normal.  Musculoskeletal:        General: Normal range of motion.     Cervical back: Normal range of motion.  Neurological:     General: No focal deficit present.     Mental Status: He is alert.  Psychiatric:        Attention and Perception: Attention and perception normal.        Mood and Affect: Mood is anxious. Affect is flat.        Speech: Speech normal.        Behavior: Behavior normal. Behavior is cooperative.        Thought Content: Thought content normal.        Cognition and Memory: Cognition and memory normal.        Judgment: Judgment is impulsive.    ROS Blood pressure 120/76, pulse 71, temperature 97.8 F (36.6 C), temperature source Oral, resp. rate (!) 21, height 5\' 6"  (1.676 m), weight 86.6 kg, SpO2 97%. Body mass index is 30.83 kg/m.   Treatment Plan Summary: Daily contact with patient to assess and evaluate symptoms and progress in treatment and Medication management Risperdal Consta 25 mg IM administered per provider order to manage homicidal ideation and  psychosis Monitor for effectiveness of the medication and any side effects. Encourage the patient to verbalize concerns rather than act on impulses. Reassess homicidal ideation and response to medication within 24 hours. Monitor for any escalation in behavior or worsening of psychotic symptoms. Myriam Forehand, NP 10/04/2023, 10:21 AM

## 2023-10-01 NOTE — Progress Notes (Signed)
   10/01/23 1700  Psych Admission Type (Psych Patients Only)  Admission Status Voluntary  Psychosocial Assessment  Patient Complaints Anxiety;Depression;Hopelessness;Self-harm thoughts  Eye Contact Fair;Intense  Facial Expression Anxious  Affect Appropriate to circumstance  Speech Logical/coherent  Interaction Assertive  Motor Activity Slow  Appearance/Hygiene In scrubs;Unremarkable  Behavior Characteristics Cooperative;Appropriate to situation  Mood Anxious;Pleasant  Aggressive Behavior  Effect No apparent injury  Thought Process  Coherency Circumstantial  Content WDL  Delusions None reported or observed  Perception WDL  Hallucination None reported or observed  Judgment Impaired  Confusion None  Danger to Self  Current suicidal ideation? Passive  Self-Injurious Behavior Some self-injurious ideation observed or expressed.  No lethal plan expressed   Agreement Not to Harm Self Yes  Description of Agreement Verbal  Danger to Others  Danger to Others None reported or observed  Danger to Others Abnormal  Harmful Behavior to others No threats or harm toward other people  Destructive Behavior No threats or harm toward property

## 2023-10-01 NOTE — Group Note (Signed)
Date:  10/01/2023 Time:  5:28 PM  Group Topic/Focus:  Self Care:   The focus of this group is to help patients understand the importance of self-care in order to improve or restore emotional, physical, spiritual, interpersonal, and financial health.      Participation Level:  Minimal  Participation Quality:  Appropriate  Affect:  Appropriate  Cognitive:  Appropriate  Insight: Appropriate  Engagement in Group:  Engaged  Modes of Intervention:  Activity and Socialization  Additional Comments:    Wilford Corner 10/01/2023, 5:28 PM

## 2023-10-01 NOTE — Plan of Care (Signed)
Patient denies si,hi and avh. Patient received pm medication. Problem: Education: Goal: Knowledge of [facility] Education information/materials will improve Outcome: Progressing Goal: Emotional status will improve Outcome: Progressing

## 2023-10-02 DIAGNOSIS — F323 Major depressive disorder, single episode, severe with psychotic features: Secondary | ICD-10-CM | POA: Diagnosis not present

## 2023-10-02 NOTE — Progress Notes (Signed)
   10/02/23 0830  Psych Admission Type (Psych Patients Only)  Admission Status Voluntary  Psychosocial Assessment  Patient Complaints Anxiety;Depression;Hopelessness  Eye Contact Fair;Watchful  Facial Expression Anxious  Affect Appropriate to circumstance  Speech Logical/coherent  Interaction Assertive  Motor Activity Slow  Appearance/Hygiene In scrubs;Unremarkable  Behavior Characteristics Cooperative;Appropriate to situation  Mood Pleasant  Aggressive Behavior  Effect No apparent injury  Thought Process  Coherency Circumstantial  Content WDL  Delusions None reported or observed  Perception WDL  Hallucination None reported or observed  Judgment Impaired  Confusion None  Danger to Self  Current suicidal ideation? Denies  Self-Injurious Behavior No self-injurious ideation or behavior indicators observed or expressed   Agreement Not to Harm Self Yes  Description of Agreement Verbal  Danger to Others  Danger to Others None reported or observed  Danger to Others Abnormal  Harmful Behavior to others No threats or harm toward other people  Destructive Behavior No threats or harm toward property

## 2023-10-02 NOTE — Group Note (Signed)
Date:  10/02/2023 Time:  10:20 AM  Group Topic/Focus:  Goals Group:   The focus of this group is to help patients establish daily goals to achieve during treatment and discuss how the patient can incorporate goal setting into their daily lives to aide in recovery.    Participation Level:  Active  Participation Quality:  Appropriate  Affect:  Appropriate  Cognitive:  Appropriate  Insight: Appropriate  Engagement in Group:  Engaged  Modes of Intervention:  Discussion, Education, and Support  Additional Comments:    David Salinas 10/02/2023, 10:20 AM

## 2023-10-02 NOTE — Plan of Care (Signed)
  Problem: Education: Goal: Knowledge of [facility] Education information/materials will improve Outcome: Progressing Goal: Emotional status will improve Outcome: Progressing Goal: Mental status will improve Outcome: Progressing Goal: Verbalization of understanding the information provided will improve Outcome: Progressing   Problem: Activity: Goal: Interest or engagement in activities will improve Outcome: Progressing Goal: Sleeping patterns will improve Outcome: Progressing   Problem: Coping: Goal: Ability to verbalize frustrations and anger appropriately will improve Outcome: Progressing Goal: Ability to demonstrate self-control will improve Outcome: Progressing   Problem: Health Behavior/Discharge Planning: Goal: Identification of resources available to assist in meeting health care needs will improve Outcome: Progressing Goal: Compliance with treatment plan for underlying cause of condition will improve Outcome: Progressing   Problem: Physical Regulation: Goal: Ability to maintain clinical measurements within normal limits will improve Outcome: Progressing   Problem: Safety: Goal: Periods of time without injury will increase Outcome: Progressing   Problem: Education: Goal: Ability to state activities that reduce stress will improve Outcome: Progressing   Problem: Coping: Goal: Ability to identify and develop effective coping behavior will improve Outcome: Progressing   Problem: Self-Concept: Goal: Ability to identify factors that promote anxiety will improve Outcome: Progressing Goal: Level of anxiety will decrease Outcome: Progressing   Problem: Education: Goal: Knowledge of General Education information will improve Description: Including pain rating scale, medication(s)/side effects and non-pharmacologic comfort measures Outcome: Progressing   Problem: Health Behavior/Discharge Planning: Goal: Ability to manage health-related needs will  improve Outcome: Progressing   Problem: Clinical Measurements: Goal: Ability to maintain clinical measurements within normal limits will improve Outcome: Progressing Goal: Will remain free from infection Outcome: Progressing Goal: Diagnostic test results will improve Outcome: Progressing Goal: Respiratory complications will improve Outcome: Progressing Goal: Cardiovascular complication will be avoided Outcome: Progressing   Problem: Activity: Goal: Risk for activity intolerance will decrease Outcome: Progressing   Problem: Nutrition: Goal: Adequate nutrition will be maintained Outcome: Progressing   Problem: Coping: Goal: Level of anxiety will decrease Outcome: Progressing   Problem: Elimination: Goal: Will not experience complications related to bowel motility Outcome: Progressing Goal: Will not experience complications related to urinary retention Outcome: Progressing   Problem: Pain Management: Goal: General experience of comfort will improve Outcome: Progressing   Problem: Safety: Goal: Ability to remain free from injury will improve Outcome: Progressing   Problem: Skin Integrity: Goal: Risk for impaired skin integrity will decrease Outcome: Progressing

## 2023-10-02 NOTE — Progress Notes (Signed)
Pt is alert and oriented X4. Affect is bright/mood is congruent. Denies anxiety but reports mild depression as well as SI/HI/AVH. No pain at this time.   Scheduled medications administered to patient per MD orders. Support and encouragement provided. Routine safety checks conducted every 15 minutes without incident. Patient informed to notify staff with problems or concerns and verbalizes understanding.  No adverse drug noted. Pt is compliant with medications and treatment plan. Pt receptive, calm, cooperative and interacts well with others on the unit.Pt contracts for safety and remains safe on the unit vat this time.

## 2023-10-02 NOTE — Group Note (Signed)
Date:  10/02/2023 Time:  7:08 PM  Group Topic/Focus:  Activity Group:  The focus of the group is to promote activity with the patients to encourage them to go outside to the courtyard to get some fresh air and some exercise.     Participation Level:  Did Not Attend   David Salinas 10/02/2023, 7:08 PM

## 2023-10-02 NOTE — Progress Notes (Signed)
Nursing Shift Note:  1900-0700  Attended Evening Group: yes Medication Compliant:  yes Behavior: cooperative; anxious Sleep Quality: good Significant Changes: none noted Pt was active in the milieu with no c/o distress.  Denies SI Hi AVH.  Continued monitoring for safety.    10/02/23 0600  Psych Admission Type (Psych Patients Only)  Admission Status Voluntary  Psychosocial Assessment  Patient Complaints Anxiety;Depression  Eye Contact Fair  Facial Expression Anxious  Affect Appropriate to circumstance  Speech Logical/coherent  Interaction Assertive  Motor Activity Slow  Appearance/Hygiene In scrubs  Behavior Characteristics Cooperative  Mood Anxious  Thought Process  Coherency WDL  Content WDL  Delusions None reported or observed  Perception WDL  Hallucination None reported or observed  Judgment Impaired  Confusion None  Danger to Self  Current suicidal ideation? Denies  Danger to Others  Danger to Others None reported or observed  Danger to Others Abnormal  Harmful Behavior to others No threats or harm toward other people  Destructive Behavior No threats or harm toward property

## 2023-10-02 NOTE — Plan of Care (Signed)
  Problem: Education: Goal: Mental status will improve Outcome: Progressing   Problem: Activity: Goal: Interest or engagement in activities will improve Outcome: Progressing   

## 2023-10-03 ENCOUNTER — Other Ambulatory Visit: Payer: Self-pay

## 2023-10-03 DIAGNOSIS — F3341 Major depressive disorder, recurrent, in partial remission: Secondary | ICD-10-CM | POA: Diagnosis not present

## 2023-10-03 MED ORDER — FLUOXETINE HCL 20 MG PO CAPS
60.0000 mg | ORAL_CAPSULE | Freq: Every day | ORAL | Status: DC
  Administered 2023-10-04 – 2023-10-06 (×3): 60 mg via ORAL
  Filled 2023-10-03 (×3): qty 3

## 2023-10-03 MED ORDER — LOSARTAN POTASSIUM 100 MG PO TABS
100.0000 mg | ORAL_TABLET | Freq: Every day | ORAL | 0 refills | Status: DC
  Filled 2023-10-03: qty 30, 30d supply, fill #0

## 2023-10-03 MED ORDER — FLUOXETINE HCL 20 MG PO CAPS
60.0000 mg | ORAL_CAPSULE | Freq: Every day | ORAL | 0 refills | Status: DC
  Filled 2023-10-03: qty 90, 30d supply, fill #0

## 2023-10-03 MED ORDER — ATORVASTATIN CALCIUM 10 MG PO TABS
10.0000 mg | ORAL_TABLET | Freq: Every day | ORAL | 0 refills | Status: DC
  Filled 2023-10-03: qty 30, 30d supply, fill #0

## 2023-10-03 MED ORDER — RISPERIDONE MICROSPHERES ER 50 MG IM SRER
25.0000 mg | INTRAMUSCULAR | 1 refills | Status: DC
  Filled 2023-10-03: qty 1, 28d supply, fill #0

## 2023-10-03 MED ORDER — TRAZODONE HCL 100 MG PO TABS
100.0000 mg | ORAL_TABLET | Freq: Every evening | ORAL | 0 refills | Status: AC | PRN
  Filled 2023-10-03: qty 30, 30d supply, fill #0

## 2023-10-03 MED ORDER — GABAPENTIN 100 MG PO CAPS
200.0000 mg | ORAL_CAPSULE | Freq: Three times a day (TID) | ORAL | 0 refills | Status: DC
  Filled 2023-10-03: qty 180, 30d supply, fill #0

## 2023-10-03 MED ORDER — GABAPENTIN 100 MG PO CAPS
200.0000 mg | ORAL_CAPSULE | Freq: Three times a day (TID) | ORAL | Status: DC
  Administered 2023-10-03 – 2023-10-06 (×9): 200 mg via ORAL
  Filled 2023-10-03 (×9): qty 2

## 2023-10-03 MED ORDER — METOPROLOL SUCCINATE ER 50 MG PO TB24
50.0000 mg | ORAL_TABLET | Freq: Every day | ORAL | 0 refills | Status: DC
  Filled 2023-10-03: qty 30, 30d supply, fill #0

## 2023-10-03 MED ORDER — HYDROXYZINE HCL 25 MG PO TABS
75.0000 mg | ORAL_TABLET | Freq: Three times a day (TID) | ORAL | 0 refills | Status: DC | PRN
  Filled 2023-10-03: qty 30, 4d supply, fill #0

## 2023-10-03 MED ORDER — RISPERIDONE 2 MG PO TABS
4.0000 mg | ORAL_TABLET | Freq: Every day | ORAL | 0 refills | Status: DC
  Filled 2023-10-03: qty 60, 30d supply, fill #0

## 2023-10-03 MED ORDER — DOXEPIN HCL 25 MG PO CAPS
50.0000 mg | ORAL_CAPSULE | Freq: Every day | ORAL | 0 refills | Status: DC
  Filled 2023-10-03: qty 60, 30d supply, fill #0

## 2023-10-03 NOTE — Group Note (Signed)
Date:  10/03/2023 Time:  5:25 PM  Group Topic/Focus:  Crisis Planning:   The purpose of this group is to help patients create a crisis plan for use upon discharge or in the future, as needed.    Participation Level:  Active  Participation Quality:  Appropriate  Affect:  Appropriate  Cognitive:  Appropriate  Insight: Appropriate  Engagement in Group:  Engaged  Modes of Intervention:  Activity  Additional Comments:    Mary Sella Miguelangel Korn 10/03/2023, 5:25 PM

## 2023-10-03 NOTE — Progress Notes (Signed)
   10/03/23 1025  Psych Admission Type (Psych Patients Only)  Admission Status Voluntary  Psychosocial Assessment  Patient Complaints Anxiety  Eye Contact Brief  Facial Expression Anxious  Affect Appropriate to circumstance  Speech Logical/coherent  Interaction Assertive  Motor Activity Slow  Appearance/Hygiene In scrubs  Behavior Characteristics Cooperative  Mood Pleasant  Thought Process  Coherency Circumstantial  Content WDL  Delusions None reported or observed  Perception WDL  Hallucination None reported or observed  Judgment Impaired  Confusion None  Danger to Self  Current suicidal ideation? Denies  Self-Injurious Behavior No self-injurious ideation or behavior indicators observed or expressed   Agreement Not to Harm Self Yes  Description of Agreement verbal  Danger to Others  Danger to Others None reported or observed  Danger to Others Abnormal  Harmful Behavior to others No threats or harm toward other people  Destructive Behavior No threats or harm toward property

## 2023-10-03 NOTE — Plan of Care (Signed)
  Problem: Education: Goal: Knowledge of [facility] Education information/materials will improve Outcome: Progressing Goal: Emotional status will improve Outcome: Progressing Goal: Mental status will improve Outcome: Progressing Goal: Verbalization of understanding the information provided will improve Outcome: Progressing   Problem: Activity: Goal: Interest or engagement in activities will improve Outcome: Progressing Goal: Sleeping patterns will improve Outcome: Progressing   Problem: Coping: Goal: Ability to verbalize frustrations and anger appropriately will improve Outcome: Progressing Goal: Ability to demonstrate self-control will improve Outcome: Progressing   Problem: Health Behavior/Discharge Planning: Goal: Identification of resources available to assist in meeting health care needs will improve Outcome: Progressing Goal: Compliance with treatment plan for underlying cause of condition will improve Outcome: Progressing   Problem: Physical Regulation: Goal: Ability to maintain clinical measurements within normal limits will improve Outcome: Progressing   Problem: Safety: Goal: Periods of time without injury will increase Outcome: Progressing   Problem: Education: Goal: Ability to state activities that reduce stress will improve Outcome: Progressing   Problem: Coping: Goal: Ability to identify and develop effective coping behavior will improve Outcome: Progressing   Problem: Self-Concept: Goal: Ability to identify factors that promote anxiety will improve Outcome: Progressing Goal: Level of anxiety will decrease Outcome: Progressing   Problem: Education: Goal: Knowledge of General Education information will improve Description: Including pain rating scale, medication(s)/side effects and non-pharmacologic comfort measures Outcome: Progressing   Problem: Health Behavior/Discharge Planning: Goal: Ability to manage health-related needs will  improve Outcome: Progressing   Problem: Clinical Measurements: Goal: Ability to maintain clinical measurements within normal limits will improve Outcome: Progressing Goal: Will remain free from infection Outcome: Progressing Goal: Diagnostic test results will improve Outcome: Progressing Goal: Respiratory complications will improve Outcome: Progressing Goal: Cardiovascular complication will be avoided Outcome: Progressing   Problem: Activity: Goal: Risk for activity intolerance will decrease Outcome: Progressing   Problem: Nutrition: Goal: Adequate nutrition will be maintained Outcome: Progressing   Problem: Coping: Goal: Level of anxiety will decrease Outcome: Progressing   Problem: Elimination: Goal: Will not experience complications related to bowel motility Outcome: Progressing Goal: Will not experience complications related to urinary retention Outcome: Progressing   Problem: Pain Management: Goal: General experience of comfort will improve Outcome: Progressing   Problem: Safety: Goal: Ability to remain free from injury will improve Outcome: Progressing   Problem: Skin Integrity: Goal: Risk for impaired skin integrity will decrease Outcome: Progressing

## 2023-10-03 NOTE — Group Note (Signed)
[facility] LCSW Group Therapy Note   Group Date: 10/03/2023 Start Time: 1300 End Time: 1400   Type of Therapy/Topic:  Group Therapy:  Emotion Regulation  Participation Level:  Active   Mood:  Description of Group:    The purpose of this group is to assist patients in learning to regulate negative emotions and experience positive emotions. Patients will be guided to discuss ways in which they have been vulnerable to their negative emotions. These vulnerabilities will be juxtaposed with experiences of positive emotions or situations, and patients challenged to use positive emotions to combat negative ones. Special emphasis will be placed on coping with negative emotions in conflict situations, and patients will process healthy conflict resolution skills.  Therapeutic Goals: Patient will identify two positive emotions or experiences to reflect on in order to balance out negative emotions:  Patient will label two or more emotions that they find the most difficult to experience:  Patient will be able to demonstrate positive conflict resolution skills through discussion or role plays:   Summary of Patient Progress: During group patient was forthcoming with information and offered insight and perspective to peers. Patient was attentive and engaged appropriately in conversation. Patient shared his personal experiences which offered encouragement to peers. Patient was respectful and empathetic to peers. Patient did well adding to the positive group dynamic and receiving feedback from group facilitator and peers.   Therapeutic Modalities:   Cognitive Behavioral Therapy Feelings Identification Dialectical Behavioral Therapy   Lowry Ram, LCSW

## 2023-10-03 NOTE — Group Note (Signed)
Date:  10/03/2023 Time:  5:29 PM  Group Topic/Focus:  Activity Group:  The focus of the group is to promote activity for the patients and encourage them to go outside in the courtyard and get some fresh air and some exercise.    Participation Level:  Did Not Attend   David Salinas 10/03/2023, 5:29 PM

## 2023-10-04 DIAGNOSIS — F3341 Major depressive disorder, recurrent, in partial remission: Secondary | ICD-10-CM | POA: Diagnosis not present

## 2023-10-04 NOTE — Group Note (Signed)
Date:  10/04/2023 Time:  4:19 AM  Group Topic/Focus:  Wrap-Up Group:   The focus of this group is to help patients review their daily goal of treatment and discuss progress on daily workbooks.    Participation Level:  Did Not Attend   Maglione,Priscila Bean E 10/04/2023, 4:19 AM

## 2023-10-04 NOTE — Group Note (Signed)
Date:  10/04/2023 Time:  8:32 PM  Group Topic/Focus:  Wrap-Up Group:   The focus of this group is to help patients review their daily goal of treatment and discuss progress on daily workbooks.    Participation Level:  Did Not Attend   Katina Dung 10/04/2023, 8:32 PM

## 2023-10-04 NOTE — Plan of Care (Signed)
  Problem: Education: Goal: Knowledge of [facility] Education information/materials will improve Outcome: Progressing Goal: Emotional status will improve Outcome: Progressing Goal: Mental status will improve Outcome: Progressing Goal: Verbalization of understanding the information provided will improve Outcome: Progressing   Problem: Activity: Goal: Interest or engagement in activities will improve Outcome: Progressing Goal: Sleeping patterns will improve Outcome: Progressing   Problem: Coping: Goal: Ability to verbalize frustrations and anger appropriately will improve Outcome: Progressing Goal: Ability to demonstrate self-control will improve Outcome: Progressing   Problem: Health Behavior/Discharge Planning: Goal: Identification of resources available to assist in meeting health care needs will improve Outcome: Progressing Goal: Compliance with treatment plan for underlying cause of condition will improve Outcome: Progressing   Problem: Physical Regulation: Goal: Ability to maintain clinical measurements within normal limits will improve Outcome: Progressing   Problem: Safety: Goal: Periods of time without injury will increase Outcome: Progressing   Problem: Education: Goal: Ability to state activities that reduce stress will improve Outcome: Progressing   Problem: Coping: Goal: Ability to identify and develop effective coping behavior will improve Outcome: Progressing   Problem: Self-Concept: Goal: Ability to identify factors that promote anxiety will improve Outcome: Progressing Goal: Level of anxiety will decrease Outcome: Progressing   Problem: Education: Goal: Knowledge of General Education information will improve Description: Including pain rating scale, medication(s)/side effects and non-pharmacologic comfort measures Outcome: Progressing   Problem: Health Behavior/Discharge Planning: Goal: Ability to manage health-related needs will  improve Outcome: Progressing   Problem: Clinical Measurements: Goal: Ability to maintain clinical measurements within normal limits will improve Outcome: Progressing Goal: Will remain free from infection Outcome: Progressing Goal: Diagnostic test results will improve Outcome: Progressing Goal: Respiratory complications will improve Outcome: Progressing Goal: Cardiovascular complication will be avoided Outcome: Progressing   Problem: Activity: Goal: Risk for activity intolerance will decrease Outcome: Progressing   Problem: Nutrition: Goal: Adequate nutrition will be maintained Outcome: Progressing   Problem: Coping: Goal: Level of anxiety will decrease Outcome: Progressing   Problem: Elimination: Goal: Will not experience complications related to bowel motility Outcome: Progressing Goal: Will not experience complications related to urinary retention Outcome: Progressing   Problem: Pain Management: Goal: General experience of comfort will improve Outcome: Progressing   Problem: Safety: Goal: Ability to remain free from injury will improve Outcome: Progressing   Problem: Skin Integrity: Goal: Risk for impaired skin integrity will decrease Outcome: Progressing

## 2023-10-04 NOTE — Progress Notes (Signed)
[facility] MD Progress Note  10/04/2023 11:43 AM David Salinas  MRN:  [id number] Subjective:  32 year old Caucasian male, stated, "I will go live with my dad" and "I will try to stop thinking about masturbating all the time." He also reported, "I haven't done much in the past few days.No overt signs of distress, agitation, or psychosis observed during the session.He denies suicidal ideation (SI), homicidal ideation (HI), delusions, or hallucinations. Principal Problem: Major depressive disorder, single episode, severe with psychotic features (HCC) Diagnosis: Principal Problem:   Major depressive disorder, single episode, severe with psychotic features (HCC) Active Problems:   Suicidal ideation   Generalized anxiety disorder with panic attacks  Total Time spent with patient: 45 minutes  Past Psychiatric History:  Anxiety   Past Medical History:  Past Medical History:  Diagnosis Date   Anxiety    Asthma    Chronic bronchitis (HCC)    Panic attacks     Past Surgical History:  Procedure Laterality Date   TONSILLECTOMY     Family History: History reviewed. No pertinent family history. Family Psychiatric  History: none reported Social History:  Social History   Substance and Sexual Activity  Alcohol Use Not Currently     Social History   Substance and Sexual Activity  Drug Use No    Social History   Socioeconomic History   Marital status: Legally Separated    Spouse name: Not on file   Number of children: Not on file   Years of education: Not on file   Highest education level: Not on file  Occupational History   Not on file  Tobacco Use   Smoking status: Former    Current packs/day: 0.50    Types: Cigarettes   Smokeless tobacco: Never  Vaping Use   Vaping status: Never Used  Substance and Sexual Activity   Alcohol use: Not Currently   Drug use: No   Sexual activity: Not on file  Other Topics Concern   Not on file  Social History Narrative   Not on file    Social Determinants of Health   Financial Resource Strain: Not on file  Food Insecurity: Food Insecurity Present (09/24/2023)   Hunger Vital Sign    Worried About Running Out of Food in the Last Year: Sometimes true    Ran Out of Food in the Last Year: Sometimes true  Transportation Needs: No Transportation Needs (09/24/2023)   PRAPARE - Administrator, Civil Service (Medical): No    Lack of Transportation (Non-Medical): No  Physical Activity: Not on file  Stress: Not on file  Social Connections: Not on file   Additional Social History:                         Sleep: Good  Appetite:  Good  Current Medications: Current Facility-Administered Medications  Medication Dose Route Frequency Provider Last Rate Last Admin   acetaminophen (TYLENOL) tablet 650 mg  650 mg Oral Q6H PRN Lauree Chandler, NP       alum & mag hydroxide-simeth (MAALOX/MYLANTA) 200-200-20 MG/5ML suspension 30 mL  30 mL Oral Q4H PRN Lauree Chandler, NP   30 mL at 10/01/23 2005   atorvastatin (LIPITOR) tablet 10 mg  10 mg Oral Daily Sarina Ill, DO   10 mg at 10/04/23 3474   haloperidol (HALDOL) tablet 5 mg  5 mg Oral TID PRN Lauree Chandler, NP       And  diphenhydrAMINE (BENADRYL) capsule 50 mg  50 mg Oral TID PRN Lauree Chandler, NP       doxepin (SINEQUAN) capsule 50 mg  50 mg Oral QHS Sarina Ill, DO   50 mg at 10/03/23 2126   FLUoxetine (PROZAC) capsule 60 mg  60 mg Oral Daily Myriam Forehand, NP   60 mg at 10/04/23 0841   gabapentin (NEURONTIN) capsule 200 mg  200 mg Oral TID Myriam Forehand, NP   200 mg at 10/04/23 0840   hydrOXYzine (ATARAX) tablet 75 mg  75 mg Oral TID PRN Lauree Chandler, NP   75 mg at 10/01/23 2137   loperamide (IMODIUM) capsule 2 mg  2 mg Oral PRN Sarina Ill, DO   2 mg at 09/25/23 1724   losartan (COZAAR) tablet 100 mg  100 mg Oral QPC breakfast Sarina Ill, DO   100 mg at 10/04/23 1610   magnesium  hydroxide (MILK OF MAGNESIA) suspension 30 mL  30 mL Oral Daily PRN Lauree Chandler, NP   30 mL at 10/02/23 1017   metoprolol succinate (TOPROL-XL) 24 hr tablet 50 mg  50 mg Oral Daily Sarina Ill, DO   50 mg at 10/04/23 0840   risperiDONE (RISPERDAL) tablet 2 mg  2 mg Oral [id number] Sarina Ill, DO   2 mg at 10/04/23 0841   risperiDONE microspheres (RISPERDAL CONSTA) injection 25 mg  25 mg Intramuscular Q14 Days Myriam Forehand, NP   25 mg at 09/30/23 1714   traZODone (DESYREL) tablet 100 mg  100 mg Oral QHS PRN Sarina Ill, DO   100 mg at 10/03/23 2126    Lab Results: No results found for this or any previous visit (from the past 48 hour(s)).  Blood Alcohol level:  No results found for: "ETH"  Metabolic Disorder Labs: Lab Results  Component Value Date   HGBA1C 5.9 (H) 09/27/2023   MPG 122.63 09/27/2023   MPG 100 03/16/2009   No results found for: "PROLACTIN" Lab Results  Component Value Date   CHOL 200 09/26/2023   TRIG 118 09/26/2023   HDL 23 (L) 09/26/2023   CHOLHDL 8.7 09/26/2023   VLDL 24 09/26/2023   LDLCALC 153 (H) 09/26/2023   LDLCALC (H) 03/16/2009    113        Total Cholesterol/HDL:CHD Risk Coronary Heart Disease Risk Table                     Men   Women  1/2 Average Risk   3.4   3.3  Average Risk       5.0   4.4  2 X Average Risk   9.6   7.1  3 X Average Risk  23.4   11.0        Use the calculated Patient Ratio above and the CHD Risk Table to determine the patient's CHD Risk.        ATP III CLASSIFICATION (LDL):  <100     mg/dL   Optimal  960-454  mg/dL   Near or Above                    Optimal  130-159  mg/dL   Borderline  098-119  mg/dL   High  >147     mg/dL   Very High    Physical Findings: AIMS:  , ,  ,  ,    CIWA:    COWS:  Musculoskeletal: Strength & Muscle Tone: within normal limits Gait & Station: normal Patient leans: N/A  Psychiatric Specialty Exam:  Presentation  General Appearance:   Appropriate for Environment  Eye Contact: Minimal  Speech: Clear and Coherent; Normal Rate  Speech Volume: Normal  Handedness: Right   Mood and Affect  Mood: Anxious  Affect: Flat   Thought Process  Thought Processes: Disorganized  Descriptions of Associations:Intact  Orientation:Full (Time, Place and Person)  Thought Content:Rumination  History of Schizophrenia/Schizoaffective disorder:No  Duration of Psychotic Symptoms:Greater than six months  Hallucinations:Hallucinations: None Description of Auditory Hallucinations: denies  Ideas of Reference:None  Suicidal Thoughts:Suicidal Thoughts: -- (denies) SI Active Intent and/or Plan: -- (denies) SI Passive Intent and/or Plan: -- (denies)  Homicidal Thoughts:Homicidal Thoughts: -- (denies) HI Passive Intent and/or Plan: -- (denies)   Sensorium  Memory: Remote Good; Immediate Good  Judgment: Fair  Insight: Fair   Art therapist  Concentration: Fair  Attention Span: Fair  Recall: Good  Fund of Knowledge: Good  Language: Good   Psychomotor Activity  Psychomotor Activity: Psychomotor Activity: Normal   Assets  Assets: Communication Skills; Social Support   Sleep  Sleep: Sleep: Good Number of Hours of Sleep: 6    Physical Exam: Physical Exam Psychiatric:        Attention and Perception: Attention and perception normal.        Mood and Affect: Mood is anxious. Affect is flat.        Speech: Speech normal.        Behavior: Behavior normal. Behavior is cooperative.        Thought Content: Thought content normal.        Cognition and Memory: Cognition and memory normal.        Judgment: Judgment is impulsive.    Review of Systems  Psychiatric/Behavioral:  The patient is nervous/anxious.   All other systems reviewed and are negative.  Blood pressure 120/76, pulse 71, temperature 97.8 F (36.6 C), temperature source Oral, resp. rate (!) 21, height 5\' 6"  (1.676 m),  weight 86.6 kg, SpO2 97%. Body mass index is 30.83 kg/m.   Treatment Plan Summary: Daily contact with patient to assess and evaluate symptoms and progress in treatment and Medication management Doxepin 50 mg or insomnia and/or anxiety associated with depression. Fluoxetine 40mg  for depression and/or anxiety. Gabapentin 200mg  TID for anxiety, or mood stabilization. Risperidone 2mg  bid  for mood stabilization or psychosis management. Continue monitoring mood and engagement in group activities. Encourage ongoing participation in therapeutic and social interactions. Reassess for any signs of distress, regression, or emerging symptoms. Myriam Forehand, NP 10/04/2023, 11:43 AM

## 2023-10-04 NOTE — Progress Notes (Signed)
[facility] MD Progress Note  10/04/2023 11:41 AM David Salinas  MRN:  [id number] Subjective:  32Yo Caucasian male asks When can I go home?" and is requesting a therapist to address concerns regarding "hypersexuality." The patient also asked for a 30-day supply of medication. He denies suicidal ideation (SI), homicidal ideation (HI), delusions, or hallucinations.Patient expresses readiness for discharge, indicating possible stability but requires further evaluation to ensure safety and adherence to the treatment plan.Hypersexuality reported, which may warrant further evaluation to determine underlying causes (e.g., bipolar disorder, medication side effects, or other condition Principal Problem: Major depressive disorder, single episode, severe with psychotic features (HCC) Diagnosis: Principal Problem:   Major depressive disorder, single episode, severe with psychotic features (HCC) Active Problems:   Suicidal ideation   Generalized anxiety disorder with panic attacks  Total Time spent with patient: 30 minutes  Past Psychiatric History: Anxiety  Past Medical History:  Past Medical History:  Diagnosis Date   Anxiety    Asthma    Chronic bronchitis (HCC)    Panic attacks     Past Surgical History:  Procedure Laterality Date   TONSILLECTOMY     Family History: History reviewed. No pertinent family history. Family Psychiatric  History: none reported Social History:  Social History   Substance and Sexual Activity  Alcohol Use Not Currently     Social History   Substance and Sexual Activity  Drug Use No    Social History   Socioeconomic History   Marital status: Legally Separated    Spouse name: Not on file   Number of children: Not on file   Years of education: Not on file   Highest education level: Not on file  Occupational History   Not on file  Tobacco Use   Smoking status: Former    Current packs/day: 0.50    Types: Cigarettes   Smokeless tobacco: Never  Vaping Use    Vaping status: Never Used  Substance and Sexual Activity   Alcohol use: Not Currently   Drug use: No   Sexual activity: Not on file  Other Topics Concern   Not on file  Social History Narrative   Not on file   Social Determinants of Health   Financial Resource Strain: Not on file  Food Insecurity: Food Insecurity Present (09/24/2023)   Hunger Vital Sign    Worried About Running Out of Food in the Last Year: Sometimes true    Ran Out of Food in the Last Year: Sometimes true  Transportation Needs: No Transportation Needs (09/24/2023)   PRAPARE - Administrator, Civil Service (Medical): No    Lack of Transportation (Non-Medical): No  Physical Activity: Not on file  Stress: Not on file  Social Connections: Not on file   Additional Social History:                         Sleep: Good  Appetite:  Good  Current Medications: Current Facility-Administered Medications  Medication Dose Route Frequency Provider Last Rate Last Admin   acetaminophen (TYLENOL) tablet 650 mg  650 mg Oral Q6H PRN Lauree Chandler, NP       alum & mag hydroxide-simeth (MAALOX/MYLANTA) 200-200-20 MG/5ML suspension 30 mL  30 mL Oral Q4H PRN Lauree Chandler, NP   30 mL at 10/01/23 2005   atorvastatin (LIPITOR) tablet 10 mg  10 mg Oral Daily Sarina Ill, DO   10 mg at 10/04/23 0842   haloperidol (HALDOL)  tablet 5 mg  5 mg Oral TID PRN Lauree Chandler, NP       And   diphenhydrAMINE (BENADRYL) capsule 50 mg  50 mg Oral TID PRN Lauree Chandler, NP       doxepin (SINEQUAN) capsule 50 mg  50 mg Oral QHS Sarina Ill, DO   50 mg at 10/03/23 2126   FLUoxetine (PROZAC) capsule 60 mg  60 mg Oral Daily Myriam Forehand, NP   60 mg at 10/04/23 0841   gabapentin (NEURONTIN) capsule 200 mg  200 mg Oral TID Myriam Forehand, NP   200 mg at 10/04/23 0840   hydrOXYzine (ATARAX) tablet 75 mg  75 mg Oral TID PRN Lauree Chandler, NP   75 mg at 10/01/23 2137   loperamide  (IMODIUM) capsule 2 mg  2 mg Oral PRN Sarina Ill, DO   2 mg at 09/25/23 1724   losartan (COZAAR) tablet 100 mg  100 mg Oral QPC breakfast Sarina Ill, DO   100 mg at 10/04/23 1610   magnesium hydroxide (MILK OF MAGNESIA) suspension 30 mL  30 mL Oral Daily PRN Lauree Chandler, NP   30 mL at 10/02/23 1017   metoprolol succinate (TOPROL-XL) 24 hr tablet 50 mg  50 mg Oral Daily Sarina Ill, DO   50 mg at 10/04/23 0840   risperiDONE (RISPERDAL) tablet 2 mg  2 mg Oral BH-q8a4p Sarina Ill, DO   2 mg at 10/04/23 0841   risperiDONE microspheres (RISPERDAL CONSTA) injection 25 mg  25 mg Intramuscular Q14 Days Myriam Forehand, NP   25 mg at 09/30/23 1714   traZODone (DESYREL) tablet 100 mg  100 mg Oral QHS PRN Sarina Ill, DO   100 mg at 10/03/23 2126    Lab Results: No results found for this or any previous visit (from the past 48 hour(s)).  Blood Alcohol level:  No results found for: "ETH"  Metabolic Disorder Labs: Lab Results  Component Value Date   HGBA1C 5.9 (H) 09/27/2023   MPG 122.63 09/27/2023   MPG 100 03/16/2009   No results found for: "PROLACTIN" Lab Results  Component Value Date   CHOL 200 09/26/2023   TRIG 118 09/26/2023   HDL 23 (L) 09/26/2023   CHOLHDL 8.7 09/26/2023   VLDL 24 09/26/2023   LDLCALC 153 (H) 09/26/2023   LDLCALC (H) 03/16/2009    113        Total Cholesterol/HDL:CHD Risk Coronary Heart Disease Risk Table                     Men   Women  1/2 Average Risk   3.4   3.3  Average Risk       5.0   4.4  2 X Average Risk   9.6   7.1  3 X Average Risk  23.4   11.0        Use the calculated Patient Ratio above and the CHD Risk Table to determine the patient's CHD Risk.        ATP III CLASSIFICATION (LDL):  <100     mg/dL   Optimal  960-454  mg/dL   Near or Above                    Optimal  130-159  mg/dL   Borderline  098-119  mg/dL   High  >147     mg/dL   Very High  Physical  Findings: AIMS:  , ,  ,  ,    CIWA:    COWS:     Musculoskeletal: Strength & Muscle Tone: within normal limits Gait & Station: normal Patient leans: N/A  Psychiatric Specialty Exam:  Presentation  General Appearance:  Appropriate for Environment  Eye Contact: Minimal  Speech: Clear and Coherent; Normal Rate  Speech Volume: Normal  Handedness: Right   Mood and Affect  Mood: Anxious  Affect: Flat   Thought Process  Thought Processes: Disorganized  Descriptions of Associations:Intact  Orientation:Full (Time, Place and Person)  Thought Content:Rumination  History of Schizophrenia/Schizoaffective disorder:No  Duration of Psychotic Symptoms:Greater than six months  Hallucinations:Hallucinations: None Description of Auditory Hallucinations: denies  Ideas of Reference:None  Suicidal Thoughts:Suicidal Thoughts: -- (denies) SI Active Intent and/or Plan: -- (denies) SI Passive Intent and/or Plan: -- (denies)  Homicidal Thoughts:Homicidal Thoughts: -- (denies) HI Passive Intent and/or Plan: -- (denies)   Sensorium  Memory: Remote Good; Immediate Good  Judgment: Fair  Insight: Fair   Art therapist  Concentration: Fair  Attention Span: Fair  Recall: Good  Fund of Knowledge: Good  Language: Good   Psychomotor Activity  Psychomotor Activity:Psychomotor Activity: Normal   Assets  Assets: Communication Skills; Social Support   Sleep  Sleep:Sleep: Good Number of Hours of Sleep: 6    Physical Exam: Physical Exam Vitals and nursing note reviewed.  Constitutional:      Appearance: Normal appearance.  HENT:     Head: Normocephalic and atraumatic.     Nose: Nose normal.  Pulmonary:     Effort: Pulmonary effort is normal.  Musculoskeletal:        General: Normal range of motion.     Cervical back: Normal range of motion.  Neurological:     General: No focal deficit present.     Mental Status: He is alert and  oriented to person, place, and time. Mental status is at baseline.  Psychiatric:        Attention and Perception: Attention and perception normal.        Mood and Affect: Mood is anxious. Affect is flat.        Speech: Speech normal.        Behavior: Behavior normal. Behavior is cooperative.        Thought Content: Thought content normal.        Cognition and Memory: Cognition and memory normal.        Judgment: Judgment is impulsive.    Review of Systems  Psychiatric/Behavioral:  The patient is nervous/anxious.   All other systems reviewed and are negative.  Blood pressure 120/76, pulse 71, temperature 97.8 F (36.6 C), temperature source Oral, resp. rate (!) 21, height 5\' 6"  (1.676 m), weight 86.6 kg, SpO2 97%. Body mass index is 30.83 kg/m.   Treatment Plan Summary: Daily contact with patient to assess and evaluate symptoms and progress in treatment and Medication management Doxepin 50 mg or insomnia and/or anxiety associated with depression. Fluoxetine 40mg  for depression and/or anxiety. Gabapentin 200mg  TID for anxiety, or mood stabilization. Risperidone 2mg  bid  for mood stabilization or psychosis management. Continue monitoring mood and engagement in group activities. Encourage ongoing participation in therapeutic and social interactions. Reassess for any signs of distress, regression, or emerging symptoms. Myriam Forehand, NP 10/04/2023, 11:41 AM

## 2023-10-04 NOTE — Group Note (Signed)
Date:  10/04/2023 Time:  11:17 AM  Group Topic/Focus:  Goals Group:   The focus of this group is to help patients establish daily goals to achieve during treatment and discuss how the patient can incorporate goal setting into their daily lives to aide in recovery.    Participation Level:  Active  Participation Quality:  Appropriate  Affect:  Appropriate  Cognitive:  Appropriate  Insight: Appropriate  Engagement in Group:  Engaged  Modes of Intervention:  Activity  Additional Comments:    David Salinas 10/04/2023, 11:17 AM

## 2023-10-04 NOTE — Group Note (Signed)
Date:  10/04/2023 Time:  5:22 PM  Group Topic/Focus:  Rediscovering Joy:   The focus of this group is to explore various ways to relieve stress in a positive manner.    Participation Level:  Minimal  Participation Quality:  Appropriate  Affect:  Appropriate  Cognitive:  Appropriate  Insight: Appropriate  Engagement in Group:  Engaged  Modes of Intervention:  Socialization  Additional Comments:    Wilford Corner 10/04/2023, 5:22 PM

## 2023-10-04 NOTE — Plan of Care (Signed)
  Problem: Education: Goal: Knowledge of [place] [facility] Education information/materials will improve Outcome: Progressing   Problem: Education: Goal: Mental status will improve Outcome: Progressing   

## 2023-10-04 NOTE — Progress Notes (Signed)
[name] Progress Note  10/02/2023 10:57 AM David Salinas  MRN:  [id number] Subjective:  32year-old Caucasian, He reports no suicidal ideation (SI), homicidal ideation (HI), delusions, or hallucinations.Patient observed participating in group activities and interacting with staff and other patients.  Principal Problem: Major depressive disorder, single episode, severe with psychotic features (HCC) Diagnosis: Principal Problem:   Major depressive disorder, single episode, severe with psychotic features (HCC) Active Problems:   Suicidal ideation   Generalized anxiety disorder with panic attacks  Total Time spent with patient: 45 minutes  Past Psychiatric History: Anxiety Depression  Past Medical History:  Past Medical History:  Diagnosis Date   Anxiety    Asthma    Chronic bronchitis (HCC)    Panic attacks     Past Surgical History:  Procedure Laterality Date   TONSILLECTOMY     Family History: History reviewed. No pertinent family history. Family Psychiatric  History: none reported Social History:  Social History   Substance and Sexual Activity  Alcohol Use Not Currently     Social History   Substance and Sexual Activity  Drug Use No    Social History   Socioeconomic History   Marital status: Legally Separated    Spouse name: Not on file   Number of children: Not on file   Years of education: Not on file   Highest education level: Not on file  Occupational History   Not on file  Tobacco Use   Smoking status: Former    Current packs/day: 0.50    Types: Cigarettes   Smokeless tobacco: Never  Vaping Use   Vaping status: Never Used  Substance and Sexual Activity   Alcohol use: Not Currently   Drug use: No   Sexual activity: Not on file  Other Topics Concern   Not on file  Social History Narrative   Not on file   Social Determinants of Health   Financial Resource Strain: Not on file  Food Insecurity: Food Insecurity Present (09/24/2023)   Hunger Vital  Sign    Worried About Running Out of Food in the Last Year: Sometimes true    Ran Out of Food in the Last Year: Sometimes true  Transportation Needs: No Transportation Needs (09/24/2023)   PRAPARE - Administrator, Civil Service (Medical): No    Lack of Transportation (Non-Medical): No  Physical Activity: Not on file  Stress: Not on file  Social Connections: Not on file   Additional Social History:                         Sleep: Good  Appetite:  Good  Current Medications: Current Facility-Administered Medications  Medication Dose Route Frequency Provider Last Rate Last Admin   acetaminophen (TYLENOL) tablet 650 mg  650 mg Oral Q6H PRN Lauree Chandler, NP       alum & mag hydroxide-simeth (MAALOX/MYLANTA) 200-200-20 MG/5ML suspension 30 mL  30 mL Oral Q4H PRN Lauree Chandler, NP   30 mL at 10/01/23 2005   atorvastatin (LIPITOR) tablet 10 mg  10 mg Oral Daily Sarina Ill, DO   10 mg at 10/04/23 9371   haloperidol (HALDOL) tablet 5 mg  5 mg Oral TID PRN Lauree Chandler, NP       And   diphenhydrAMINE (BENADRYL) capsule 50 mg  50 mg Oral TID PRN Lauree Chandler, NP       doxepin (SINEQUAN) capsule 50 mg  50 mg  Oral QHS Sarina Ill, DO   50 mg at 10/03/23 2126   FLUoxetine (PROZAC) capsule 60 mg  60 mg Oral Daily Myriam Forehand, NP   60 mg at 10/04/23 7253   gabapentin (NEURONTIN) capsule 200 mg  200 mg Oral TID Myriam Forehand, NP   200 mg at 10/04/23 0840   hydrOXYzine (ATARAX) tablet 75 mg  75 mg Oral TID PRN Lauree Chandler, NP   75 mg at 10/01/23 2137   loperamide (IMODIUM) capsule 2 mg  2 mg Oral PRN Sarina Ill, DO   2 mg at 09/25/23 1724   losartan (COZAAR) tablet 100 mg  100 mg Oral QPC breakfast Sarina Ill, DO   100 mg at 10/04/23 6644   magnesium hydroxide (MILK OF MAGNESIA) suspension 30 mL  30 mL Oral Daily PRN Lauree Chandler, NP   30 mL at 10/02/23 1017   metoprolol succinate  (TOPROL-XL) 24 hr tablet 50 mg  50 mg Oral Daily Sarina Ill, DO   50 mg at 10/04/23 0840   risperiDONE (RISPERDAL) tablet 2 mg  2 mg Oral BH-q8a4p Sarina Ill, DO   2 mg at 10/04/23 0347   risperiDONE microspheres (RISPERDAL CONSTA) injection 25 mg  25 mg Intramuscular Q14 Days Myriam Forehand, NP   25 mg at 09/30/23 1714   traZODone (DESYREL) tablet 100 mg  100 mg Oral QHS PRN Sarina Ill, DO   100 mg at 10/03/23 2126    Lab Results: No results found for this or any previous visit (from the past 48 hour(s)).  Blood Alcohol level:  No results found for: "ETH"  Metabolic Disorder Labs: Lab Results  Component Value Date   HGBA1C 5.9 (H) 09/27/2023   MPG 122.63 09/27/2023   MPG 100 03/16/2009   No results found for: "PROLACTIN" Lab Results  Component Value Date   CHOL 200 09/26/2023   TRIG 118 09/26/2023   HDL 23 (L) 09/26/2023   CHOLHDL 8.7 09/26/2023   VLDL 24 09/26/2023   LDLCALC 153 (H) 09/26/2023   LDLCALC (H) 03/16/2009    113        Total Cholesterol/HDL:CHD Risk Coronary Heart Disease Risk Table                     Men   Women  1/2 Average Risk   3.4   3.3  Average Risk       5.0   4.4  2 X Average Risk   9.6   7.1  3 X Average Risk  23.4   11.0        Use the calculated Patient Ratio above and the CHD Risk Table to determine the patient's CHD Risk.        ATP III CLASSIFICATION (LDL):  <100     mg/dL   Optimal  425-956  mg/dL   Near or Above                    Optimal  130-159  mg/dL   Borderline  387-564  mg/dL   High  >332     mg/dL   Very High    Physical Findings: AIMS:  , ,  ,  ,    CIWA:    COWS:     Musculoskeletal: Strength & Muscle Tone: within normal limits Gait & Station: normal Patient leans: N/A  Psychiatric Specialty Exam:  Presentation  General Appearance:  Appropriate for  Environment  Eye Contact: Good  Speech: Clear and Coherent; Normal Rate  Speech  Volume: Normal  Handedness: Right   Mood and Affect  Mood: Anxious  Affect: Flat   Thought Process  Thought Processes: Coherent  Descriptions of Associations:Intact  Orientation:Full (Time, Place and Person)  Thought Content:WDL  History of Schizophrenia/Schizoaffective disorder:No  Duration of Psychotic Symptoms:Less than six months  Hallucinations: denies Ideas of Reference:None  Suicidal Thoughts: none reported Homicidal Thoughts:none reported  Sensorium  Memory: Immediate Good; Remote Good  Judgment: Fair  Insight: Fair   Art therapist  Concentration: Good  Attention Span: Fair  Recall: Good  Fund of Knowledge: Good  Language: Good   Psychomotor Activity  Psychomotor Activity:No data recorded  Assets  Assets: Communication Skills; Housing; Social Support   Sleep  Sleep:No data recorded   Physical Exam: Physical Exam Vitals and nursing note reviewed.  Constitutional:      Appearance: Normal appearance.  HENT:     Head: Normocephalic and atraumatic.     Nose: Nose normal.  Pulmonary:     Effort: Pulmonary effort is normal.  Musculoskeletal:        General: Normal range of motion.     Cervical back: Normal range of motion.  Neurological:     General: No focal deficit present.     Mental Status: He is alert and oriented to person, place, and time.  Psychiatric:        Attention and Perception: Attention and perception normal.        Mood and Affect: Mood is anxious. Affect is flat.        Speech: Speech normal.        Behavior: Behavior normal. Behavior is cooperative.        Thought Content: Thought content normal.        Cognition and Memory: Cognition and memory normal.        Judgment: Judgment is impulsive.    Review of Systems  Psychiatric/Behavioral:  The patient is nervous/anxious.   All other systems reviewed and are negative.  Blood pressure 120/76, pulse 71, temperature 97.8 F (36.6 C),  temperature source Oral, resp. rate (!) 21, height 5\' 6"  (1.676 m), weight 86.6 kg, SpO2 97%. Body mass index is 30.83 kg/m.   Treatment Plan Summary: Daily contact with patient to assess and evaluate symptoms and progress in treatment and Medication management Doxepin 50 mg or insomnia and/or anxiety associated with depression. Fluoxetine 40mg  for depression and/or anxiety. Gabapentin 200mg  TID for anxiety, or mood stabilization. Risperidone 2mg  bid  for mood stabilization or psychosis management. Continue monitoring mood and engagement in group activities. Encourage ongoing participation in therapeutic and social interactions. Reassess for any signs of distress, regression, or emerging symptoms.  Myriam Forehand, NP 10/04/2023, 10:57 AM

## 2023-10-05 DIAGNOSIS — F3341 Major depressive disorder, recurrent, in partial remission: Secondary | ICD-10-CM | POA: Diagnosis not present

## 2023-10-05 NOTE — Plan of Care (Signed)
  Problem: Education: Goal: Knowledge of [facility] Education information/materials will improve Outcome: Progressing Goal: Emotional status will improve Outcome: Progressing Goal: Mental status will improve Outcome: Progressing Goal: Verbalization of understanding the information provided will improve Outcome: Progressing   Problem: Activity: Goal: Interest or engagement in activities will improve Outcome: Progressing Goal: Sleeping patterns will improve Outcome: Progressing   Problem: Coping: Goal: Ability to verbalize frustrations and anger appropriately will improve Outcome: Progressing Goal: Ability to demonstrate self-control will improve Outcome: Progressing   Problem: Health Behavior/Discharge Planning: Goal: Identification of resources available to assist in meeting health care needs will improve Outcome: Progressing Goal: Compliance with treatment plan for underlying cause of condition will improve Outcome: Progressing   Problem: Physical Regulation: Goal: Ability to maintain clinical measurements within normal limits will improve Outcome: Progressing   Problem: Safety: Goal: Periods of time without injury will increase Outcome: Progressing   Problem: Education: Goal: Ability to state activities that reduce stress will improve Outcome: Progressing   Problem: Coping: Goal: Ability to identify and develop effective coping behavior will improve Outcome: Progressing   Problem: Self-Concept: Goal: Ability to identify factors that promote anxiety will improve Outcome: Progressing Goal: Level of anxiety will decrease Outcome: Progressing   Problem: Education: Goal: Knowledge of General Education information will improve Description: Including pain rating scale, medication(s)/side effects and non-pharmacologic comfort measures Outcome: Progressing   Problem: Health Behavior/Discharge Planning: Goal: Ability to manage health-related needs will  improve Outcome: Progressing   Problem: Clinical Measurements: Goal: Ability to maintain clinical measurements within normal limits will improve Outcome: Progressing Goal: Will remain free from infection Outcome: Progressing Goal: Diagnostic test results will improve Outcome: Progressing Goal: Respiratory complications will improve Outcome: Progressing Goal: Cardiovascular complication will be avoided Outcome: Progressing   Problem: Activity: Goal: Risk for activity intolerance will decrease Outcome: Progressing   Problem: Nutrition: Goal: Adequate nutrition will be maintained Outcome: Progressing   Problem: Coping: Goal: Level of anxiety will decrease Outcome: Progressing   Problem: Elimination: Goal: Will not experience complications related to bowel motility Outcome: Progressing Goal: Will not experience complications related to urinary retention Outcome: Progressing   Problem: Pain Management: Goal: General experience of comfort will improve Outcome: Progressing   Problem: Safety: Goal: Ability to remain free from injury will improve Outcome: Progressing   Problem: Skin Integrity: Goal: Risk for impaired skin integrity will decrease Outcome: Progressing

## 2023-10-05 NOTE — Plan of Care (Signed)
  Problem: Education: Goal: Knowledge of [facility] [place] [facility] Education information/materials will improve Outcome: Progressing Goal: Emotional status will improve Outcome: Progressing Goal: Mental status will improve Outcome: Progressing Goal: Verbalization of understanding the information provided will improve Outcome: Progressing   Problem: Activity: Goal: Interest or engagement in activities will improve Outcome: Progressing Goal: Sleeping patterns will improve Outcome: Progressing   Problem: Coping: Goal: Ability to verbalize frustrations and anger appropriately will improve Outcome: Progressing

## 2023-10-05 NOTE — Group Note (Signed)
Date:  10/05/2023 Time:  9:49 PM  Group Topic/Focus:  Wrap-Up Group:   The focus of this group is to help patients review their daily goal of treatment and discuss progress on daily workbooks.    Participation Level:  Did Not Attend  Participation Quality:   none  Affect:   none  Cognitive:   none  Insight: None  Engagement in Group:   none  Modes of Intervention:   none  Additional Comments:  none  Belva Crome 10/05/2023, 9:49 PM

## 2023-10-05 NOTE — Group Note (Deleted)
Date:  10/05/2023 Time:  3:00 PM  Group Topic/Focus:  Healthy Communication:   The focus of this group is to discuss communication, barriers to communication, as well as healthy ways to communicate with others. MoviesTherapy encourages emotional release Even those who often have trouble expressing their emotions might find themselves laughing or crying during a film. This release of emotions can have a cathartic effect and also make it easier for a person to become more comfortable in expressing their emotions.     Participation Level:  {BHH PARTICIPATION ZHYQM:57846}  Participation Quality:  {BHH PARTICIPATION QUALITY:22265}  Affect:  {BHH AFFECT:22266}  Cognitive:  {BHH COGNITIVE:22267}  Insight: {BHH Insight2:20797}  Engagement in Group:  {BHH ENGAGEMENT IN GROUP:22268}  Modes of Intervention:  {BHH MODES OF INTERVENTION:22269}  Additional Comments:  ***  Dynesha Woolen 10/05/2023, 3:00 PM

## 2023-10-05 NOTE — Group Note (Signed)
Date:  10/05/2023 Time:  3:20 PM  Group Topic/Focus:  Activity Group:  The focus of the group is to promote activity for the patients and encourage them to come outside in the courtyard and get some fresh air and some exercise.    Participation Level:  Did Not Attend   Mary Sella Avi Archuleta 10/05/2023, 3:20 PM

## 2023-10-05 NOTE — Group Note (Signed)
Date:  10/05/2023 Time:  10:27 AM  Group Topic/Focus:  Spirituality:   The focus of this group is to discuss how one's spirituality can aide in recovery.    Participation Level:  Did Not Attend   Mary Sella Tondra Reierson 10/05/2023, 10:27 AM

## 2023-10-05 NOTE — Progress Notes (Signed)
D- Patient alert and oriented. Patient presented in a pleasant mood on assessment stating that he slept good last night and had complaints of mild constipation, in which he did request PRN medication to help with relief. Patient denied SI, HI, AVH, and pain at this time. Patient also denied any signs/symptoms of depression and anxiety, stating "I'm excited today, I'm ready to go home". Patient initially thought he was discharging today, but instead it'll be tomorrow. Patient had no stated goals for today.  A- Scheduled medications administered to patient, per MD orders. Support and encouragement provided. Routine safety checks conducted every 15 minutes. Patient informed to notify staff with problems or concerns.  R- No adverse drug reactions noted. Patient contracts for safety at this time. Patient compliant with medications and treatment plan. Patient receptive, calm, and cooperative. Patient interacts well with others on the unit. Patient remains safe at this time.   10/05/23 1000  Psych Admission Type (Psych Patients Only)  Admission Status Voluntary  Psychosocial Assessment  Patient Complaints None  Eye Contact Fair  Facial Expression Anxious;Animated  Affect Anxious;Appropriate to circumstance  Speech Logical/coherent  Interaction Assertive  Motor Activity Slow  Appearance/Hygiene In scrubs  Behavior Characteristics Cooperative;Appropriate to situation  Mood Pleasant  Aggressive Behavior  Effect No apparent injury  Thought Process  Coherency WDL  Content WDL  Delusions None reported or observed  Perception WDL  Hallucination None reported or observed  Judgment WDL  Confusion None  Danger to Self  Current suicidal ideation? Denies  Self-Injurious Behavior No self-injurious ideation or behavior indicators observed or expressed   Agreement Not to Harm Self Yes  Description of Agreement Verbal  Danger to Others  Danger to Others None reported or observed  Danger to Others  Abnormal  Harmful Behavior to others No threats or harm toward other people  Destructive Behavior No threats or harm toward property

## 2023-10-05 NOTE — Final Progress Note (Signed)
Presents pleasant and cooperative. Denies SI, Hi,AHV. Excited about discharge on tomorrow. Complaints of mild anxiety. Requested medication for sleep and anxiety. Encouragement and support provided. Safety checks maintained. Medications given as prescribed. Pt receptive and remains safe on unit with q 15 min checks.

## 2023-10-06 DIAGNOSIS — F3341 Major depressive disorder, recurrent, in partial remission: Secondary | ICD-10-CM | POA: Diagnosis not present

## 2023-10-06 DIAGNOSIS — F324 Major depressive disorder, single episode, in partial remission: Principal | ICD-10-CM

## 2023-10-06 NOTE — Discharge Summary (Signed)
Physician Discharge Summary Note  Patient:  David Salinas is an 32 y.o., male MRN:  [id number] DOB:  26-Mar-1991 Patient phone:  [phone] (home)  Patient address:   [address] [place] [address],  Total Time spent with patient: 2 hours  Date of Admission:  09/24/2023 Date of Discharge: 10/06/2023  Reason for Admission:  32 year old Caucasian male with a long-standing history of depression, anxiety, and intrusive thoughts, was admitted voluntarily due to worsening mental health symptoms. He reported active suicidal ideation with a plan to hang himself and endorsed auditory hallucinations commanding self-harm. The patient also expressed homicidal ideation with intrusive images of harming others, counting behaviors indicative of possible obsessive-compulsive tendencies, and severe anxiety. He presented to the [facility] Emergency Department after experiencing escalating anxiety and depressive symptoms.Suicidal Ideation: Active suicidal thoughts with a specific plan to hang himself. Intrusive thoughts and imagery of harming others, including stabbing.Auditory hallucinations commanding self-harm and paranoia.Persistent feelings of hopelessness, anhedonia, difficulty sleeping, and intrusive thoughts.Loss of employment due to anxiety and inability to maintain a job.History of depression since adolescence, previous suicide attempt at age 63, and treatment with Prozac, which was reportedly effective at the time.  Principal Problem: Major depression in partial remission [facility]) Discharge Diagnoses: Principal Problem:   Major depression in partial remission (HCC) Active Problems:   Suicidal ideation   Major depressive disorder, single episode, severe with psychotic features (HCC)   Generalized anxiety disorder with panic attacks   Past Psychiatric History: Major depressive disorder, single episode, severe with psychotic features   Suicidal ideation Generalized anxiety disorder  with panic attacks Past Medical History:  Past Medical History:  Diagnosis Date   Anxiety    Asthma    Chronic bronchitis (HCC)    Panic attacks     Past Surgical History:  Procedure Laterality Date   TONSILLECTOMY     Family History: History reviewed. No pertinent family history. Family Psychiatric  History: see above Social History:  Social History   Substance and Sexual Activity  Alcohol Use Not Currently     Social History   Substance and Sexual Activity  Drug Use No    Social History   Socioeconomic History   Marital status: Legally Separated    Spouse name: Not on file   Number of children: Not on file   Years of education: Not on file   Highest education level: Not on file  Occupational History   Not on file  Tobacco Use   Smoking status: Former    Current packs/day: 0.50    Types: Cigarettes   Smokeless tobacco: Never  Vaping Use   Vaping status: Never Used  Substance and Sexual Activity   Alcohol use: Not Currently   Drug use: No   Sexual activity: Not on file  Other Topics Concern   Not on file  Social History Narrative   Not on file   Social Determinants of Health   Financial Resource Strain: Not on file  Food Insecurity: Food Insecurity Present (09/24/2023)   Hunger Vital Sign    Worried About Running Out of Food in the Last Year: Sometimes true    Ran Out of Food in the Last Year: Sometimes true  Transportation Needs: No Transportation Needs (09/24/2023)   PRAPARE - Administrator, Civil Service (Medical): No    Lack of Transportation (Non-Medical): No  Physical Activity: Not on file  Stress: Not on file  Social Connections: Not on file    Hospital Course:  32 year old Caucasian male with a history of depression, anxiety, and psychotic features, was admitted for acute psychiatric stabilization following voluntary presentation to the emergency department. His admission was prompted by active suicidal ideation with a plan,  homicidal ideation, intrusive thoughts, auditory hallucinations, and significant functional impairment.Suicidal ideation with a plan to hang himself.Auditory hallucinations commanding self-harm.Intrusive thoughts of harming others.Severe anxiety, depressed mood, and paranoia.He endorsed a history of difficulty sleeping, anhedonia, and hypersexuality. Physical examination and lab work, including toxicology screening, were unremarkable. No evidence of substance use or withdrawal symptoms was identified.Initiated Risperdal Consta 25 mg IM to address psychosis and stabilize mood.Continued Hydroxyzine 10 mg as needed for acute anxiety symptoms. Transitioned from Paxil (which the patient reported as ineffective) to Prozac 60 mg daily for mood stabilization and to address hypersexuality.Added Gabapentin 200 mg three times daily for mood stabilization and anxiety relief.Introduced Doxepin 50 mg nightly to manage insomnia. Managed comorbid hypertension with Metoprolol 50 mg daily and Cozaar 100 mg daily. Provided individual therapy sessions focusing on safety planning, coping strategies, and managing intrusive thoughts.Initiated referrals for ongoing outpatient therapy and psychiatric follow-up. Explored hypersexuality as a symptom of possible underlying mood instability, with plans for outpatient evaluation.Initially withdrawn and unresponsive to staff interactions, the patient gradually engaged more during treatment.Demonstrated improved mood and reduced anxiety, with no further reports of auditory hallucinations or suicidal/homicidal ideation.Expressed readiness for discharge, stating, "I feel better and am ready to go home."Denied SI, HI, delusions, or hallucinations at the time of discharge.Provided a 30-day supply of medications with clear instructions for adherence. Connected to crisis resources, including the National Suicide Prevention Lifeline (988) and local behavioral health services.Scheduled outpatient  follow-up within 7-14 days with a psychiatrist and therapist for continued care.The patient was discharged in a stable condition with comprehensive support and resources in place to ensure continuity of care and safety.  Musculoskeletal: Strength & Muscle Tone: within normal limits Gait & Station: normal Patient leans: N/A   Psychiatric Specialty Exam:  Presentation  General Appearance:  Appropriate for Environment; Neat  Eye Contact: Good  Speech: Clear and Coherent; Normal Rate  Speech Volume: Normal  Handedness: Right   Mood and Affect  Mood: Euthymic  Affect: Congruent   Thought Process  Thought Processes: Coherent  Descriptions of Associations:Intact  Orientation:Full (Time, Place and Person) (and situation)  Thought Content:WDL  History of Schizophrenia/Schizoaffective disorder:No  Duration of Psychotic Symptoms:Greater than six months (and situation)  Hallucinations:Hallucinations: None Description of Auditory Hallucinations: denies  Ideas of Reference:None  Suicidal Thoughts:SI Active Intent and/or Plan: -- (denies) SI Passive Intent and/or Plan: -- (denies)  Homicidal Thoughts:HI Passive Intent and/or Plan: -- (denies)   Sensorium  Memory: Immediate Good; Remote Good  Judgment: Fair  Insight: Fair   Art therapist  Concentration: Fair  Attention Span: Good  Recall: Good  Fund of Knowledge: Good  Language: Good   Psychomotor Activity  Psychomotor Activity: Psychomotor Activity: Normal   Assets  Assets: Communication Skills; Housing; Social Support   Sleep  Sleep: Sleep: Good Number of Hours of Sleep: 7    Physical Exam: Physical Exam Vitals and nursing note reviewed.  Constitutional:      Appearance: Normal appearance.  HENT:     Head: Normocephalic and atraumatic.     Nose: Nose normal.  Pulmonary:     Effort: Pulmonary effort is normal.  Musculoskeletal:        General: Normal range of  motion.     Cervical back: Normal range of motion.  Neurological:  Mental Status: He is alert.  Psychiatric:        Attention and Perception: Attention and perception normal.        Mood and Affect: Mood and affect normal.        Speech: Speech normal.        Behavior: Behavior normal. Behavior is cooperative.        Thought Content: Thought content normal.        Cognition and Memory: Cognition and memory normal.        Judgment: Judgment normal.    ROS Blood pressure 110/67, pulse 64, temperature 98.2 F (36.8 C), resp. rate 17, height 5\' 6"  (1.676 m), weight 86.6 kg, SpO2 98%. Body mass index is 30.83 kg/m.   Social History   Tobacco Use  Smoking Status Former   Current packs/day: 0.50   Types: Cigarettes  Smokeless Tobacco Never   Tobacco Cessation:  A prescription for an FDA-approved tobacco cessation medication provided at discharge   Blood Alcohol level:  No results found for: "ETH"  Metabolic Disorder Labs:  Lab Results  Component Value Date   HGBA1C 5.9 (H) 09/27/2023   MPG 122.63 09/27/2023   MPG 100 03/16/2009   No results found for: "PROLACTIN" Lab Results  Component Value Date   CHOL 200 09/26/2023   TRIG 118 09/26/2023   HDL 23 (L) 09/26/2023   CHOLHDL 8.7 09/26/2023   VLDL 24 09/26/2023   LDLCALC 153 (H) 09/26/2023   LDLCALC (H) 03/16/2009    113        Total Cholesterol/HDL:CHD Risk Coronary Heart Disease Risk Table                     Men   Women  1/2 Average Risk   3.4   3.3  Average Risk       5.0   4.4  2 X Average Risk   9.6   7.1  3 X Average Risk  23.4   11.0        Use the calculated Patient Ratio above and the CHD Risk Table to determine the patient's CHD Risk.        ATP III CLASSIFICATION (LDL):  <100     mg/dL   Optimal  161-096  mg/dL   Near or Above                    Optimal  130-159  mg/dL   Borderline  045-409  mg/dL   High  >811     mg/dL   Very High    See Psychiatric Specialty Exam and Suicide Risk  Assessment completed by Attending Physician prior to discharge.  Discharge destination:  Home  Is patient on multiple antipsychotic therapies at discharge:  No   Has Patient had three or more failed trials of antipsychotic monotherapy by history:  No  Recommended Plan for Multiple Antipsychotic Therapies: Taper to monotherapy as described:  started on Rispedal LAI   Allergies as of 10/06/2023       Reactions   Codeine    Heart racing      Penicillins    Anaphylaxis         Medication List     TAKE these medications      Indication  albuterol 108 (90 Base) MCG/ACT inhaler Commonly known as: VENTOLIN HFA Inhale 1-2 puffs into the lungs every 6 (six) hours as needed for wheezing or shortness of breath.  Indication: Asthma  atorvastatin 10 MG tablet Commonly known as: LIPITOR Take 1 tablet (10 mg total) by mouth daily.  Indication: High Amount of Fats in the Blood   doxepin 25 MG capsule Commonly known as: SINEQUAN Take 2 capsules (50 mg total) by mouth at bedtime.  Indication: Depression   FLUoxetine 20 MG capsule Commonly known as: PROZAC Take 3 capsules (60 mg total) by mouth daily.  Indication: Depression   gabapentin 100 MG capsule Commonly known as: NEURONTIN Take 2 capsules (200 mg total) by mouth 3 (three) times daily.  Indication: Panic Disorder, Social Anxiety Disorder   hydrOXYzine 25 MG tablet Commonly known as: ATARAX Take 3 tablets (75 mg total) by mouth 3 (three) times daily as needed for anxiety. What changed:  how much to take reasons to take this  Indication: Feeling Anxious, Feeling Tense   losartan 100 MG tablet Commonly known as: COZAAR Take 1 tablet (100 mg total) by mouth daily after breakfast.  Indication: High Blood Pressure   metoprolol succinate 50 MG 24 hr tablet Commonly known as: TOPROL-XL Take 1 tablet (50 mg total) by mouth daily.  Indication: High Blood Pressure   risperiDONE 2 MG tablet Commonly known as:  RISPERDAL Take 2 tablets (4 mg total) by mouth at bedtime.  Indication: Major Depressive Disorder   risperiDONE microspheres 50 MG injection Commonly known as: RISPERDAL CONSTA Inject 1 mL (25 mg total) into the muscle every 14 (fourteen) days for 4 doses. Start taking on: October 14, 2023  Indication: Manic-Depression   traZODone 100 MG tablet Commonly known as: DESYREL Take 1 tablet (100 mg total) by mouth at bedtime as needed for sleep.  Indication: Trouble Sleeping, Migraine Headache        Follow-up Information     [facility], [facility]. Go to.   Why: Appointment schedualed for 10/08/23 at 11 AM. Contact information: [address] [place] [address] [phone]                 Follow-up recommendations:  Activity:  as tolerated Diet:  heart healthy  Comments:   Provide a 30-day supply of prescribed medications Risperdal (Risperidone) for psychosis and mood stabilization. Hydroxyzine as needed for anxiety. Paxil (Paroxetine) continued with follow-up to reassess efficacy Doxepin 50 mg nightly - for insomnia and anxiety management. Prozac (Fluoxetine) 60 mg daily - for depression and to address hypersexuality by decreasing sexual urges. Gabapentin 200 mg three times daily - for anxiety and mood stabilization. Risperdal (Risperidone) 2 mg twice daily - for psychosis and mood stabilization. Metoprolol 50 mg daily - for hypertension and heart rate control. Cozaar (Losartan) 100 mg daily - for blood pressure management. Lipitor (Atorvastatin) 10 mg nightly - for cholesterol management. Schedule a follow-up appointment with your psychiatrist and primary care provider within 7-14 days to reassess medication efficacy and address any concerns. Contact your assigned therapist to begin addressing hypersexuality and other mood-related 988 Suicide & Crisis Lifeline: Available 24/7 for confidential support for individuals in distress. Call or text 988 to  connect with trained crisis counselor [facility] 10/08/2023 11a  Signed: Myriam Forehand, NP 10/06/2023, 10:51 AM

## 2023-10-06 NOTE — Progress Notes (Signed)
[facility] MD Progress Note  10/06/2023 10:26 AM David Salinas  MRN:  [id number] Subjective:   32 year old Caucasian male who states, "I am ready to go home." He denies suicidal ideation (SI), homicidal ideation (HI), and delusions. The patient reports that he is feeling better.he patient demonstrates improved mood and stability. No indications of immediate safety concerns (e.g., SI, HI, delusions). Ready for discharge with appropriate follow-up. Principal Problem: Major depressive disorder, single episode, severe with psychotic features (HCC) Diagnosis: Principal Problem:   Major depressive disorder, single episode, severe with psychotic features (HCC) Active Problems:   Suicidal ideation   Generalized anxiety disorder with panic attacks  Total Time spent with patient: 45 minutes  Past Psychiatric History: Anxiety   Past Medical History:  Past Medical History:  Diagnosis Date   Anxiety    Asthma    Chronic bronchitis (HCC)    Panic attacks     Past Surgical History:  Procedure Laterality Date   TONSILLECTOMY     Family History: History reviewed. No pertinent family history. Family Psychiatric  History: none reported Social History:  Social History   Substance and Sexual Activity  Alcohol Use Not Currently     Social History   Substance and Sexual Activity  Drug Use No    Social History   Socioeconomic History   Marital status: Legally Separated    Spouse name: Not on file   Number of children: Not on file   Years of education: Not on file   Highest education level: Not on file  Occupational History   Not on file  Tobacco Use   Smoking status: Former    Current packs/day: 0.50    Types: Cigarettes   Smokeless tobacco: Never  Vaping Use   Vaping status: Never Used  Substance and Sexual Activity   Alcohol use: Not Currently   Drug use: No   Sexual activity: Not on file  Other Topics Concern   Not on file  Social History Narrative   Not on file   Social  Determinants of Health   Financial Resource Strain: Not on file  Food Insecurity: Food Insecurity Present (09/24/2023)   Hunger Vital Sign    Worried About Running Out of Food in the Last Year: Sometimes true    Ran Out of Food in the Last Year: Sometimes true  Transportation Needs: No Transportation Needs (09/24/2023)   PRAPARE - Administrator, Civil Service (Medical): No    Lack of Transportation (Non-Medical): No  Physical Activity: Not on file  Stress: Not on file  Social Connections: Not on file   Additional Social History:                         Sleep: Good  Appetite:  Good  Current Medications: Current Facility-Administered Medications  Medication Dose Route Frequency Provider Last Rate Last Admin   acetaminophen (TYLENOL) tablet 650 mg  650 mg Oral Q6H PRN Lauree Chandler, NP       alum & mag hydroxide-simeth (MAALOX/MYLANTA) 200-200-20 MG/5ML suspension 30 mL  30 mL Oral Q4H PRN Lauree Chandler, NP   30 mL at 10/04/23 1533   atorvastatin (LIPITOR) tablet 10 mg  10 mg Oral Daily Sarina Ill, DO   10 mg at 10/06/23 0815   haloperidol (HALDOL) tablet 5 mg  5 mg Oral TID PRN Lauree Chandler, NP       And   diphenhydrAMINE (BENADRYL) capsule 50  mg  50 mg Oral TID PRN Lauree Chandler, NP       doxepin (SINEQUAN) capsule 50 mg  50 mg Oral QHS Sarina Ill, DO   50 mg at 10/05/23 2103   FLUoxetine (PROZAC) capsule 60 mg  60 mg Oral Daily Myriam Forehand, NP   60 mg at 10/06/23 8119   gabapentin (NEURONTIN) capsule 200 mg  200 mg Oral TID Myriam Forehand, NP   200 mg at 10/06/23 1478   hydrOXYzine (ATARAX) tablet 75 mg  75 mg Oral TID PRN Lauree Chandler, NP   75 mg at 10/01/23 2137   loperamide (IMODIUM) capsule 2 mg  2 mg Oral PRN Sarina Ill, DO   2 mg at 09/25/23 1724   losartan (COZAAR) tablet 100 mg  100 mg Oral QPC breakfast Sarina Ill, DO   100 mg at 10/06/23 0815   magnesium hydroxide  (MILK OF MAGNESIA) suspension 30 mL  30 mL Oral Daily PRN Lauree Chandler, NP   30 mL at 10/05/23 0924   metoprolol succinate (TOPROL-XL) 24 hr tablet 50 mg  50 mg Oral Daily Sarina Ill, DO   50 mg at 10/06/23 0815   risperiDONE (RISPERDAL) tablet 2 mg  2 mg Oral [id number] Sarina Ill, DO   2 mg at 10/06/23 2956   risperiDONE microspheres (RISPERDAL CONSTA) injection 25 mg  25 mg Intramuscular Q14 Days Myriam Forehand, NP   25 mg at 09/30/23 1714   traZODone (DESYREL) tablet 100 mg  100 mg Oral QHS PRN Sarina Ill, DO   100 mg at 10/05/23 2103    Lab Results: No results found for this or any previous visit (from the past 48 hour(s)).  Blood Alcohol level:  No results found for: "ETH"  Metabolic Disorder Labs: Lab Results  Component Value Date   HGBA1C 5.9 (H) 09/27/2023   MPG 122.63 09/27/2023   MPG 100 03/16/2009   No results found for: "PROLACTIN" Lab Results  Component Value Date   CHOL 200 09/26/2023   TRIG 118 09/26/2023   HDL 23 (L) 09/26/2023   CHOLHDL 8.7 09/26/2023   VLDL 24 09/26/2023   LDLCALC 153 (H) 09/26/2023   LDLCALC (H) 03/16/2009    113        Total Cholesterol/HDL:CHD Risk Coronary Heart Disease Risk Table                     Men   Women  1/2 Average Risk   3.4   3.3  Average Risk       5.0   4.4  2 X Average Risk   9.6   7.1  3 X Average Risk  23.4   11.0        Use the calculated Patient Ratio above and the CHD Risk Table to determine the patient's CHD Risk.        ATP III CLASSIFICATION (LDL):  <100     mg/dL   Optimal  213-086  mg/dL   Near or Above                    Optimal  130-159  mg/dL   Borderline  578-469  mg/dL   High  >629     mg/dL   Very High     Musculoskeletal: Strength & Muscle Tone: within normal limits Gait & Station: normal Patient leans: N/A  Psychiatric Specialty Exam:  Presentation  General  Appearance:  Appropriate for Environment; Neat  Eye Contact: Good  Speech: Clear  and Coherent; Normal Rate  Speech Volume: Normal  Handedness: Right   Mood and Affect  Mood: Euthymic  Affect: Congruent   Thought Process  Thought Processes: Coherent  Descriptions of Associations:Intact  Orientation:Full (Time, Place and Person) (and situation)  Thought Content:WDL  History of Schizophrenia/Schizoaffective disorder:No  Duration of Psychotic Symptoms:Greater than six months (and situation)  Hallucinations:Hallucinations: None Description of Auditory Hallucinations: denies  Ideas of Reference:None  Suicidal Thoughts:SI Active Intent and/or Plan: -- (denies) SI Passive Intent and/or Plan: -- (denies)  Homicidal Thoughts:HI Passive Intent and/or Plan: -- (denies)   Sensorium  Memory: Immediate Good; Remote Good  Judgment: Fair  Insight: Fair   Art therapist  Concentration: Fair  Attention Span: Good  Recall: Good  Fund of Knowledge: Good  Language: Good   Psychomotor Activity  Psychomotor Activity:Psychomotor Activity: Normal   Assets  Assets: Communication Skills; Housing; Social Support   Sleep  Sleep:Sleep: Good Number of Hours of Sleep: 7    Physical Exam: Physical Exam Vitals and nursing note reviewed.  Constitutional:      Appearance: Normal appearance.  HENT:     Head: Normocephalic and atraumatic.     Nose: Nose normal.  Pulmonary:     Effort: Pulmonary effort is normal.  Musculoskeletal:        General: Normal range of motion.     Cervical back: Normal range of motion.  Neurological:     General: No focal deficit present.     Mental Status: He is alert and oriented to person, place, and time. Mental status is at baseline.  Psychiatric:        Attention and Perception: Attention and perception normal.        Mood and Affect: Mood and affect normal.        Speech: Speech normal.        Behavior: Behavior normal. Behavior is cooperative.        Thought Content: Thought content normal.         Cognition and Memory: Cognition and memory normal.        Judgment: Judgment normal.    Review of Systems  All other systems reviewed and are negative.  Blood pressure 110/67, pulse 64, temperature 98.2 F (36.8 C), resp. rate 17, height 5\' 6"  (1.676 m), weight 86.6 kg, SpO2 98%. Body mass index is 30.83 kg/m.   Treatment Plan Summary: Daily contact with patient to assess and evaluate symptoms and progress in treatment and Medication management Doxepin 50 mg or insomnia and/or anxiety associated with depression. Fluoxetine 40mg  for depression and/or anxiety. Gabapentin 200mg  TID for anxiety, or mood stabilization. Risperidone 2mg  bid  for mood stabilization or psychosis management. Continue monitoring mood and engagement in group activities. Encourage ongoing participation in therapeutic and social interactions. Myriam Forehand, NP 10/06/2023, 10:26 AM

## 2023-10-06 NOTE — Progress Notes (Signed)
Patient pleasant and cooperative on approach. Denies SI,HI and AVH. Verbalized understanding discharge instructions,prescriptions and follow up care. 7 days medicines given to patient. All belongings returned from BMU locker. Suicide safety plan filled by patient and placed in chart. Copy given to patient.Patient escorted out by staff and transported by family. 

## 2023-10-06 NOTE — Progress Notes (Signed)
  [facility] Adult Case Management Discharge Plan :  Will you be returning to the same living situation after discharge:  Yes,  Patient will be returning home. At discharge, do you have transportation home?: Yes,  Patient's sister to offer transportation.  Do you have the ability to pay for your medications: Yes, [facility]   Release of information consent forms completed and in the chart;  Patient's signature needed at discharge.  Patient to Follow up at:  Follow-up Information     [facility], [facility]. Go to.   Why: Appointment schedualed for 10/08/23 at 11 AM. Contact information: [address] [place] [address] [phone]                 Next level of care provider has access to [facility] Link:no  Safety Planning and Suicide Prevention discussed: Yes,  SPE completed with patient's sister with patient's consent. Patient also provided with SPE material at discharge.      Has patient been referred to the Quitline?: Patient does not use tobacco/nicotine products  Patient has been referred for addiction treatment: No known substance use disorder.  Lowry Ram, LCSW 10/06/2023, 10:36 AM

## 2023-10-06 NOTE — [facility] Suicide Risk Assessment (Signed)
[facility] Discharge Suicide Risk Assessment   Principal Problem: Major depression in partial remission (HCC) Discharge Diagnoses: Principal Problem:   Major depression in partial remission (HCC) Active Problems:   Suicidal ideation   Major depressive disorder, single episode, severe with psychotic features (HCC)   Generalized anxiety disorder with panic attacks   Total Time spent with patient: 2 hours  Musculoskeletal: Strength & Muscle Tone: within normal limits Gait & Station: normal Patient leans: N/A  Psychiatric Specialty Exam  Presentation  General Appearance:  Appropriate for Environment; Neat  Eye Contact: Good  Speech: Clear and Coherent; Normal Rate  Speech Volume: Normal  Handedness: Right   Mood and Affect  Mood: Euthymic  Duration of Depression Symptoms: Greater than two weeks  Affect: Congruent   Thought Process  Thought Processes: Coherent  Descriptions of Associations:Intact  Orientation:Full (Time, Place and Person) (and situation)  Thought Content:WDL  History of Schizophrenia/Schizoaffective disorder:No  Duration of Psychotic Symptoms:Greater than six months (and situation)  Hallucinations:Hallucinations: None Description of Auditory Hallucinations: denies  Ideas of Reference:None  Suicidal Thoughts:SI Active Intent and/or Plan: -- (denies) SI Passive Intent and/or Plan: -- (denies)  Homicidal Thoughts:HI Passive Intent and/or Plan: -- (denies)   Sensorium  Memory: Immediate Good; Remote Good  Judgment: Fair  Insight: Fair   Art therapist  Concentration: Fair  Attention Span: Good  Recall: Good  Fund of Knowledge: Good  Language: Good   Psychomotor Activity  Psychomotor Activity: Psychomotor Activity: Normal   Assets  Assets: Communication Skills; Housing; Social Support   Sleep  Sleep: Sleep: Good Number of Hours of Sleep: 7   Physical Exam: Physical Exam Vitals and nursing note  reviewed.  Constitutional:      Appearance: Normal appearance.  HENT:     Head: Normocephalic and atraumatic.     Nose: Nose normal.  Pulmonary:     Effort: Pulmonary effort is normal.  Musculoskeletal:        General: Normal range of motion.     Cervical back: Normal range of motion.  Neurological:     General: No focal deficit present.     Mental Status: He is alert and oriented to person, place, and time. Mental status is at baseline.  Psychiatric:        Attention and Perception: Attention and perception normal.        Mood and Affect: Mood and affect normal.        Speech: Speech normal.        Behavior: Behavior normal. Behavior is cooperative.        Thought Content: Thought content normal.        Cognition and Memory: Cognition and memory normal.        Judgment: Judgment normal.    ROS Blood pressure 110/67, pulse 64, temperature 98.2 F (36.8 C), resp. rate 17, height 5\' 6"  (1.676 m), weight 86.6 kg, SpO2 98%. Body mass index is 30.83 kg/m.  Mental Status Per Nursing Assessment::   On Admission:  Self-harm thoughts  Demographic Factors:  Male, Low socioeconomic status, and Unemployed  Loss Factors: Financial problems/change in socioeconomic status  Historical Factors: Prior suicide attempts, Family history of mental illness or substance abuse, and Impulsivity  Risk Reduction Factors:   Living with another person, especially a relative, Positive social support, Positive therapeutic relationship, and Positive coping skills or problem solving skills  Continued Clinical Symptoms:  Depression:   Impulsivity Previous Psychiatric Diagnoses and Treatments  Cognitive Features That Contribute To Risk:  None  Suicide Risk:  Minimal: No identifiable suicidal ideation.  Patients presenting with no risk factors but with morbid ruminations; may be classified as minimal risk based on the severity of the depressive symptoms   Follow-up Information     [facility]. Go to.   Why: Appointment schedualed for 10/08/23 at 11 AM. Contact information: [address] [place] [address] [phone]                 Plan Of Care/Follow-up recommendations:  Activity:  as tolerated Diet:  heart healthy Provide a 30-day supply of prescribed medications Risperdal (Risperidone) for psychosis and mood stabilization. Hydroxyzine as needed for anxiety. Paxil (Paroxetine) continued with follow-up to reassess efficacy Doxepin 50 mg nightly - for insomnia and anxiety management. Prozac (Fluoxetine) 60 mg daily - for depression and to address hypersexuality by decreasing sexual urges. Gabapentin 200 mg three times daily - for anxiety and mood stabilization. Risperdal (Risperidone) 2 mg twice daily - for psychosis and mood stabilization. Metoprolol 50 mg daily - for hypertension and heart rate control. Cozaar (Losartan) 100 mg daily - for blood pressure management. Lipitor (Atorvastatin) 10 mg nightly - for cholesterol management. Schedule a follow-up appointment with your psychiatrist and primary care provider within 7-14 days to reassess medication efficacy and address any concerns. Contact your assigned therapist to begin addressing hypersexuality and other mood-related 988 Suicide & Crisis Lifeline: Available 24/7 for confidential support for individuals in distress. Call or text [phone] to connect with trained crisis counselor [facility] 10/08/2023 11am Myriam Forehand, NP  10/06/2023, 10:34 AM

## 2023-10-06 NOTE — Group Note (Signed)
Date:  10/06/2023 Time:  10:31 AM  Group Topic/Focus:  Dimensions of Wellness:   The focus of this group is to introduce the topic of wellness and discuss the role each dimension of wellness plays in total health. Goals Group:   The focus of this group is to help patients establish daily goals to achieve during treatment and discuss how the patient can incorporate goal setting into their daily lives to aide in recovery. Self Care:   The focus of this group is to help patients understand the importance of self-care in order to improve or restore emotional, physical, spiritual, interpersonal, and financial health.    Participation Level:  Active  Participation Quality:  Appropriate and Attentive  Affect:  Appropriate  Cognitive:  Alert, Appropriate, and Oriented  Insight: Appropriate  Engagement in Group:  Developing/Improving, Engaged, and Supportive  Modes of Intervention:  Activity, Discussion, and Education  Additional Comments:    David Salinas 10/06/2023, 10:31 AM

## 2023-10-06 NOTE — Group Note (Signed)
Recreation Therapy Group Note   Group Topic:Coping Skills  Group Date: 10/06/2023 Start Time: 1010 End Time: 1105 Facilitators: Rosina Lowenstein, LRT, CTRS Location:  Craft Room  Group Description: Mind Map.  Patient was provided a blank template of a diagram with 32 blank boxes in a tiered system, branching from the center (similar to a bubble chart). LRT directed patients to label the middle of the diagram "Coping Skills". LRT and patients then came up with 8 different coping skills as examples. Pt were directed to record their coping skills in the 2nd tier boxes closest to the center.  Patients would then share their coping skills with the group as LRT wrote them out. LRT gave a handout of 99 different coping skills at the end of group.   Goal Area(s) Addressed: Patients will be able to define "coping skills". Patient will identify new coping skills.  Patient will increase communication.   Affect/Mood: Appropriate   Participation Level: Active and Engaged   Participation Quality: Independent   Behavior: Appropriate, Calm, and Cooperative   Speech/Thought Process: Coherent   Insight: Good and Improved   Judgement: Good   Modes of Intervention: Education, Group work, Guided Discussion, and Worksheet   Patient Response to Interventions:  Attentive, Interested , and Receptive   Education Outcome:  Acknowledges education   Clinical Observations/Individualized Feedback: David Salinas was active in their participation of session activities and group discussion. Pt identified "Not all coping skills are good, like eating if you eat too much". Pt spontaneously contributed to group discussion while interacting well with LRT and peers duration of session.    Plan: Continue to engage patient in RT group sessions 2-3x/week.   Rosina Lowenstein, LRT, CTRS 10/06/2023 12:35 PM

## 2023-10-07 ENCOUNTER — Ambulatory Visit (INDEPENDENT_AMBULATORY_CARE_PROVIDER_SITE_OTHER): Payer: Self-pay | Admitting: Student

## 2023-10-07 ENCOUNTER — Encounter: Payer: Self-pay | Admitting: Student

## 2023-10-07 ENCOUNTER — Other Ambulatory Visit: Payer: Self-pay

## 2023-10-07 VITALS — BP 116/60 | HR 75 | Temp 98.1°F | Ht 66.0 in | Wt 202.1 lb

## 2023-10-07 DIAGNOSIS — I10 Essential (primary) hypertension: Secondary | ICD-10-CM

## 2023-10-07 DIAGNOSIS — R7303 Prediabetes: Secondary | ICD-10-CM

## 2023-10-07 DIAGNOSIS — F323 Major depressive disorder, single episode, severe with psychotic features: Secondary | ICD-10-CM

## 2023-10-07 DIAGNOSIS — Z0000 Encounter for general adult medical examination without abnormal findings: Secondary | ICD-10-CM | POA: Insufficient documentation

## 2023-10-07 DIAGNOSIS — D72829 Elevated white blood cell count, unspecified: Secondary | ICD-10-CM

## 2023-10-07 DIAGNOSIS — E785 Hyperlipidemia, unspecified: Secondary | ICD-10-CM

## 2023-10-07 MED ORDER — LOSARTAN POTASSIUM 100 MG PO TABS: 100.0000 mg | ORAL_TABLET | Freq: Every day | ORAL | 3 refills | Status: AC

## 2023-10-07 MED ORDER — METOPROLOL SUCCINATE ER 50 MG PO TB24: 50.0000 mg | ORAL_TABLET | Freq: Every day | ORAL | 3 refills | Status: DC

## 2023-10-07 MED ORDER — ATORVASTATIN CALCIUM 10 MG PO TABS: 10.0000 mg | ORAL_TABLET | Freq: Every day | ORAL | 3 refills | Status: AC

## 2023-10-07 NOTE — Assessment & Plan Note (Signed)
At goal, 116/60.  Without symptoms. - Continue losartan 100 and Toprol 50 daily.  Might be able to come off these in the future.

## 2023-10-07 NOTE — Assessment & Plan Note (Signed)
Recent lipid panel showed elevation, LDL 150.  No previous stroke or heart disease.  He is now on atorvastatin 10 daily.  Will repeat lipids at next blood draw, adjust statin if indicated. - Continue atorvastatin 10 daily

## 2023-10-07 NOTE — Assessment & Plan Note (Signed)
HIV and hepatitis C screen at next blood draw.

## 2023-10-07 NOTE — Progress Notes (Unsigned)
CC: Establish care  HPI:  David Salinas is a 32 y.o. male living with a history stated below and presents today for establish care, he has no acute complaints today.  He is requesting refills on his blood pressure and cholesterol medicines.  He is here because of a recent hospitalization for severe psychiatric illness, OCD, involving suicidal ideation.  This is resolved.  He sees a psychiatrist monthly.  Past Medical History:  Diagnosis Date   Anxiety    Asthma    Chronic bronchitis (HCC)    Panic attacks     Current Outpatient Medications on File Prior to Visit  Medication Sig Dispense Refill   albuterol (VENTOLIN HFA) 108 (90 Base) MCG/ACT inhaler Inhale 1-2 puffs into the lungs every 6 (six) hours as needed for wheezing or shortness of breath. 1 each 0   doxepin (SINEQUAN) 25 MG capsule Take 2 capsules (50 mg total) by mouth at bedtime. 60 capsule 0   FLUoxetine (PROZAC) 20 MG capsule Take 3 capsules (60 mg total) by mouth daily. 90 capsule 0   gabapentin (NEURONTIN) 100 MG capsule Take 2 capsules (200 mg total) by mouth 3 (three) times daily. 180 capsule 0   hydrOXYzine (ATARAX) 25 MG tablet Take 3 tablets (75 mg total) by mouth 3 (three) times daily as needed for anxiety. 30 tablet 0   risperiDONE (RISPERDAL) 2 MG tablet Take 2 tablets (4 mg total) by mouth at bedtime. 60 tablet 0   [START ON 10/14/2023] risperiDONE microspheres (RISPERDAL CONSTA) 50 MG injection Inject 1 mL (25 mg total) into the muscle every 14 (fourteen) days for 4 doses. 1 each 1   traZODone (DESYREL) 100 MG tablet Take 1 tablet (100 mg total) by mouth at bedtime as needed for sleep. 30 tablet 0   [DISCONTINUED] pantoprazole (PROTONIX) 40 MG tablet Take 1 tablet (40 mg total) by mouth daily. 30 tablet 1   [DISCONTINUED] promethazine (PHENERGAN) 25 MG tablet Take 1 tablet (25 mg total) by mouth every 4 (four) hours as needed for nausea or vomiting. (Patient not taking: Reported on 04/11/2021) 30 tablet 1    No current facility-administered medications on file prior to visit.    Family History  Problem Relation Age of Onset   Anxiety disorder Mother    Depression Mother    Hypertension Mother    Diabetes Mother    Hypothyroidism Mother    Anxiety disorder Father    Hypertension Father    Hyperlipidemia Father    Lymphoma Maternal Grandfather     Social History   Socioeconomic History   Marital status: Legally Separated    Spouse name: Not on file   Number of children: Not on file   Years of education: Not on file   Highest education level: Not on file  Occupational History   Not on file  Tobacco Use   Smoking status: Former    Current packs/day: 0.50    Types: Cigarettes   Smokeless tobacco: Never  Vaping Use   Vaping status: Never Used  Substance and Sexual Activity   Alcohol use: Not Currently   Drug use: No   Sexual activity: Not on file  Other Topics Concern   Not on file  Social History Narrative   Not on file   Social Determinants of Health   Financial Resource Strain: Not on file  Food Insecurity: Food Insecurity Present (09/24/2023)   Hunger Vital Sign    Worried About Running Out of Food in the Last  Year: Sometimes true    Ran Out of Food in the Last Year: Sometimes true  Transportation Needs: No Transportation Needs (09/24/2023)   PRAPARE - Administrator, Civil Service (Medical): No    Lack of Transportation (Non-Medical): No  Physical Activity: Not on file  Stress: Not on file  Social Connections: Socially Isolated (10/07/2023)   Social Connection and Isolation Panel [NHANES]    Frequency of Communication with Friends and Family: More than three times a week    Frequency of Social Gatherings with Friends and Family: More than three times a week    Attends Religious Services: Never    Database administrator or Organizations: No    Attends Banker Meetings: Never    Marital Status: Separated  Intimate Partner Violence:  Not At Risk (09/24/2023)   Humiliation, Afraid, Rape, and Kick questionnaire    Fear of Current or Ex-Partner: No    Emotionally Abused: No    Physically Abused: No    Sexually Abused: No    Review of Systems: ROS negative except for what is noted on the assessment and plan.  Vitals:   10/07/23 1117  BP: 116/60  Pulse: 75  Temp: 98.1 F (36.7 C)  TempSrc: Oral  SpO2: 98%  Weight: 202 lb 1.6 oz (91.7 kg)  Height: 5\' 6"  (1.676 m)    Physical Exam: Constitutional: well-appearing man sitting in chair, in no acute distress HENT: normocephalic atraumatic, mucous membranes moist Eyes: conjunctiva non-erythematous Cardiovascular: regular rate and rhythm, no m/r/g Pulmonary/Chest: normal work of breathing on room air, lungs clear to auscultation bilaterally MSK: normal bulk and tone Neurological: alert & oriented x 3, no focal deficit Skin: warm and dry Psych: normal mood and behavior  Assessment & Plan:   Patient discussed with Dr. Antony Contras  No problem-specific Assessment & Plan notes found for this encounter.  David Salinas is here to establish care today.  He has no acute complaints.  We went over his personal and family medical history and I updated the chart as appropriate.  He has a family history of many metabolic disorders.  No acute cancer history.  His personal medical history includes hypertension and hyperlipidemia.  He has psychiatric disease as above, and sees a psychiatrist for this.  He had blood work recently so I do not want to repeat this today.  Blood work was positive for prediabetes and high cholesterol.  Metabolics good.  CBD with leukocytosis, unclear the cause.    Katheran James, D.O. [facility], PGY-1 Phone: [phone] Date 10/07/2023 Time 4:35 PM

## 2023-10-07 NOTE — Assessment & Plan Note (Signed)
Psychiatric history includes depression, anxiety, OCD, psychosis, unspecified paraphilia.  He appears well today, has no complaints regarding these.  He sees a Therapist, sports monthly in [place].

## 2023-10-08 ENCOUNTER — Ambulatory Visit: Payer: Self-pay | Admitting: Licensed Clinical Social Worker

## 2023-10-08 DIAGNOSIS — D72829 Elevated white blood cell count, unspecified: Secondary | ICD-10-CM | POA: Insufficient documentation

## 2023-10-08 NOTE — Assessment & Plan Note (Signed)
A1c 5.9.  We discussed diet and exercise, lifestyle.  He has a pretty good understanding of these things.  He will avoid sugary drinks, alcohol, areas of improvement would be more physical activity less processed foods.

## 2023-10-08 NOTE — Assessment & Plan Note (Signed)
White count of 15 with neutrophilia, unclear cause, no signs of infection.  There have been intermittent white counts in his history as well.  Will monitor this. - CBC with differential at next blood draw.

## 2023-10-09 NOTE — Progress Notes (Signed)
Internal Medicine Clinic Attending  I was physically present during the key portions of the resident provided service and participated in the medical decision making of patient's management care. I reviewed pertinent patient test results.  The assessment, diagnosis, and plan were formulated together and I agree with the documentation in the resident's note.  Reymundo Poll, MD

## 2023-11-02 ENCOUNTER — Emergency Department
Admission: EM | Admit: 2023-11-02 | Discharge: 2023-11-03 | Disposition: A | Discharge status: Other Acute Inpatient Hospital | Payer: 59 | Attending: Emergency Medicine | Admitting: Emergency Medicine

## 2023-11-02 ENCOUNTER — Other Ambulatory Visit: Payer: Self-pay

## 2023-11-02 DIAGNOSIS — R45850 Homicidal ideations: Secondary | ICD-10-CM

## 2023-11-02 DIAGNOSIS — F339 Major depressive disorder, recurrent, unspecified: Secondary | ICD-10-CM | POA: Diagnosis present

## 2023-11-02 DIAGNOSIS — J45909 Unspecified asthma, uncomplicated: Secondary | ICD-10-CM | POA: Insufficient documentation

## 2023-11-02 DIAGNOSIS — F69 Unspecified disorder of adult personality and behavior: Secondary | ICD-10-CM | POA: Diagnosis not present

## 2023-11-02 DIAGNOSIS — R45851 Suicidal ideations: Secondary | ICD-10-CM

## 2023-11-02 DIAGNOSIS — Y900 Blood alcohol level of less than 20 mg/100 ml: Secondary | ICD-10-CM | POA: Insufficient documentation

## 2023-11-02 DIAGNOSIS — F1310 Sedative, hypnotic or anxiolytic abuse, uncomplicated: Secondary | ICD-10-CM | POA: Insufficient documentation

## 2023-11-02 DIAGNOSIS — R440 Auditory hallucinations: Secondary | ICD-10-CM | POA: Diagnosis present

## 2023-11-02 DIAGNOSIS — F418 Other specified anxiety disorders: Secondary | ICD-10-CM | POA: Insufficient documentation

## 2023-11-02 DIAGNOSIS — Z7951 Long term (current) use of inhaled steroids: Secondary | ICD-10-CM | POA: Insufficient documentation

## 2023-11-02 LAB — CBC
HCT: 44.9 % (ref 39.0–52.0)
Hemoglobin: 15.7 g/dL (ref 13.0–17.0)
MCH: 29.8 pg (ref 26.0–34.0)
MCHC: 35 g/dL (ref 30.0–36.0)
MCV: 85.2 fL (ref 80.0–100.0)
Platelets: 366 10*3/uL (ref 150–400)
RBC: 5.27 MIL/uL (ref 4.22–5.81)
RDW: 12.6 % (ref 11.5–15.5)
WBC: 10.3 10*3/uL (ref 4.0–10.5)
nRBC: 0 % (ref 0.0–0.2)

## 2023-11-02 LAB — SALICYLATE LEVEL: Salicylate Lvl: <7 mg/dL — ABNORMAL LOW (ref 7.0–30.0)

## 2023-11-02 LAB — COMPREHENSIVE METABOLIC PANEL
ALT: 34 U/L (ref 0–44)
AST: 16 U/L (ref 15–41)
Albumin: 4.7 g/dL (ref 3.5–5.0)
Alkaline Phosphatase: 68 U/L (ref 38–126)
Anion gap: 13 (ref 5–15)
BUN: 19 mg/dL (ref 6–20)
CO2: 21 mmol/L — ABNORMAL LOW (ref 22–32)
Calcium: 9.6 mg/dL (ref 8.9–10.3)
Chloride: 103 mmol/L (ref 98–111)
Creatinine, Ser: 1.14 mg/dL (ref 0.61–1.24)
GFR, Estimated: >60 mL/min (ref >60)
Glucose, Bld: 101 mg/dL — ABNORMAL HIGH (ref 70–99)
Potassium: 4 mmol/L (ref 3.5–5.1)
Sodium: 137 mmol/L (ref 135–145)
Total Bilirubin: 0.5 mg/dL (ref <1.2)
Total Protein: 8.1 g/dL (ref 6.5–8.1)

## 2023-11-02 LAB — URINE DRUG SCREEN, QUALITATIVE (ARMC ONLY)
Amphetamines, Ur Screen: NOT DETECTED
Barbiturates, Ur Screen: NOT DETECTED
Benzodiazepine, Ur Scrn: NOT DETECTED
Cannabinoid 50 Ng, Ur ~~LOC~~: NOT DETECTED
Cocaine Metabolite,Ur ~~LOC~~: NOT DETECTED
MDMA (Ecstasy)Ur Screen: NOT DETECTED
Methadone Scn, Ur: NOT DETECTED
Opiate, Ur Screen: NOT DETECTED
Phencyclidine (PCP) Ur S: NOT DETECTED
Tricyclic, Ur Screen: POSITIVE — AB

## 2023-11-02 LAB — ETHANOL: Alcohol, Ethyl (B): <10 mg/dL (ref <10)

## 2023-11-02 LAB — ACETAMINOPHEN LEVEL: Acetaminophen (Tylenol), Serum: <10 ug/mL — ABNORMAL LOW (ref 10–30)

## 2023-11-02 MED ORDER — OLANZAPINE 5 MG PO TBDP: 5.0000 mg | ORAL_TABLET | Freq: Three times a day (TID) | ORAL | Status: DC | PRN

## 2023-11-02 MED ORDER — LORAZEPAM 1 MG PO TABS: 1.0000 mg | ORAL_TABLET | ORAL | Status: DC | PRN

## 2023-11-02 MED ORDER — OLANZAPINE 5 MG PO TABS
5.0000 mg | ORAL_TABLET | Freq: Every day | ORAL | Status: DC
  Administered 2023-11-02: 5 mg via ORAL
  Filled 2023-11-02: qty 1

## 2023-11-02 MED ORDER — ZIPRASIDONE MESYLATE 20 MG IM SOLR: 20.0000 mg | INTRAMUSCULAR | Status: DC | PRN

## 2023-11-02 NOTE — Consult Note (Signed)
[facility] Psychiatric Consult Initial  Patient Name: .David Salinas  MRN: [id number]  DOB: [birth year]  Consult Order details:  Orders (From admission, onward)     Start     Ordered   11/02/23 1554  IP CONSULT TO PSYCHIATRY       Ordering Provider: Dionne Bucy, MD  Provider:  (Not yet assigned)  Question Answer Comment  Place call to: Psych NP   Reason for Consult Consult   Diagnosis/Clinical Info for Consult: SI      11/02/23 1553   11/02/23 1554  CONSULT TO CALL ACT TEAM       Ordering Provider: Dionne Bucy, MD  Provider:  (Not yet assigned)  Question:  Reason for Consult?  Answer:  Psych consult   11/02/23 1554             Mode of Visit: Tele-visit Virtual Statement:TELE PSYCHIATRY ATTESTATION & CONSENT As the provider for this telehealth consult, I attest that I verified the patient's identity using two separate identifiers, introduced myself to the patient, provided my credentials, disclosed my location, and performed this encounter via a HIPAA-compliant, real-time, face-to-face, two-way, interactive audio and video platform and with the full consent and agreement of the patient (or guardian as applicable.) Patient physical location: [facility]. Telehealth provider physical location: home office in state of [place].   Video start time: 1634 Video end time: 1647    Psychiatry Consult Evaluation  Service Date: November 02, 2023 LOS:  LOS: 0 days  Chief Complaint Suicidal and homicidal thought  with auditory hallucination  Primary Psychiatric Diagnoses  Suicidal ideation  2.  Homicidal ideation  3.  Auditory hallucination   Assessment  David Salinas is a 32 y.o. male admitted: Presented to the [facility] 11/02/2023  3:48 PM for HI, SI and Auditory hallucination. He carries the psychiatric diagnoses of MDD with psychosis and Anxiety and has a past medical history of  see chart.   Patient is alert and oriented x 4, speech is clear, maintain eye contact.  Patient was  seen via telemedicine for psych consultation due to suicidal ideation and homicidal thoughts.  A review of patient's records show recent hospitalization in November 2024.  Per the patient he is suicidal to go to his grandparent's house and hang himself.  Patient also reports he is homicidal because he wants to kill his neighbor when asked what is the reason why he wants to do this patient stated no reason.   Patient also reports he feels like raping people when he talks to them, according to patient he wants to get some kind of release or good feeling from it.  When asked why would he want to do such thing patient stated I want to fulfill my sexual needs.  Patient continues to elaborate that thank God he does not have a gun because he would carry out his desires.  According to patient he does take his medication but they are not helping.  Patient also report auditory hallucinations that he hears voices, patient denies smoking denies drinking denies illicit drug use.  According to patient he is seeing a psychiatrist at [facility].  Patient is currently unemployed stating that he cannot keep down a job, patient lives at home with his parents.   Per the patient the only time I feel good is when I am institutionalize, I feel like I may snap anytime.  Throughout the interview process patient has been very nonchalant when talking, does have a fluid conversation, very disturbing  thought process and showed no remorse when talking.  It is very clear that patient does not realize the ramification of what he is saying.  Clinically patient needs a thorough psych evaluation because of his disturbing thought process, and no unremorseful comments.  Patient does appear to take pleasure in the way he talks and does seem to get a kick out of it.   Writer did consult with Dr. Enedina Finner MD , presented to him my findings and we both concluded that patient does meet inpatient criteria with further psych evaluation.   Please see plan  below for detailed recommendations.   Diagnoses:  Active Hospital problems: Active Problems:   * No active hospital problems. *    Plan   ## Psychiatric Medication Recommendations:  Patient currently takes the following medication risperidone 2 mg every 14 days, and doxepin 25 mg daily  ## Medical Decision Making Capacity:     ## Further Work-up:  -- Recommend for psych evaluation in person UDS    ## Disposition:-- We recommend inpatient psychiatric hospitalization when medically cleared. Patient is under voluntary admission status at this time; please IVC if attempts to leave hospital.  ## Behavioral / Environmental: - Patients with borderline personality traits/disorder often use the language of physical pain to communicate both physical and emotional suffering. It is important to address pain complaints as they arise and attempt to identify an etiology, either organic or psychiatric. In patients with chronic pain, it is important to have a discussion with the patient about expectations about pain control.    ## Safety and Observation Level:  - Based on my clinical evaluation, I estimate the patient to be at low risk of self harm in the current setting. - At this time, we recommend  1:1 Observation. This decision is based on my review of the chart including patient's history and current presentation, interview of the patient, mental status examination, and consideration of suicide risk including evaluating suicidal ideation, plan, intent, suicidal or self-harm behaviors, risk factors, and protective factors. This judgment is based on our ability to directly address suicide risk, implement suicide prevention strategies, and develop a safety plan while the patient is in the clinical setting. Please contact our team if there is a concern that risk level has changed.  CSSR Risk Category:C-SSRS RISK CATEGORY: High Risk  Suicide Risk Assessment: Patient has following modifiable risk  factors for suicide: active suicidal ideation, social isolation, and recklessness, which we are addressing . Patient has following non-modifiable or demographic risk factors for suicide: male gender, history of suicide attempt, and psychiatric hospitalization Patient has the following protective factors against suicide: Supportive family, Frustration tolerance, and no history of suicide attempts  Thank you for this consult request. Recommendations have been communicated to the primary team.  We will inpatient admission at this time.   Sindy Guadeloupe, NP       History of Present Illness  Relevant Aspects of Hospital ED Course:  Admitted on 11/02/2023 for HI and SI with auditory hallucination .   Patient Report:  See above notes   Psych ROS:  Depression: yes Anxiety:  yes Mania (lifetime and current): no Psychosis: (lifetime and current): yes  Collateral information:  TTS to get information from family   Review of Systems  Constitutional: Negative.   HENT: Negative.    Eyes: Negative.   Respiratory: Negative.    Cardiovascular: Negative.   Gastrointestinal: Negative.   Genitourinary: Negative.   Musculoskeletal: Negative.   Skin: Negative.  Neurological: Negative.   Psychiatric/Behavioral:  Positive for depression, hallucinations and suicidal ideas. The patient is nervous/anxious.      Psychiatric and Social History  Psychiatric History:  Information collected from Pt  Prev Dx/Sx: MDD and Si, Anxiety  Current Psych Provider: at [facility] Home Meds (current): see chart Previous Med Trials: yes Therapy: unknown  Prior Psych Hospitalization: yes  Prior Self Harm: yes Prior Violence: yes  Family Psych History: unknown  Family Hx suicide: unknown  Social History:  Developmental Hx: none reported Educational Hx: unknown  Occupational Hx: unemployed  Armed forces operational officer Hx: unknown Living Situation: with both Parents  Spiritual Hx: unknown  Access to weapons/lethal means: denies  access to guns   Substance History Alcohol: denies  Type of alcohol denies  Last Drink denies Number of drinks per day n/a History of alcohol withdrawal seizures denies History of DT's none  Tobacco: denies Illicit drugs: denies Prescription drug abuse: denies Rehab hx: unknow  Exam Findings  Physical Exam: see chart  Vital Signs:  Temp:  [97.8 F (36.6 C)] 97.8 F (36.6 C) (12/29 1536) Pulse Rate:  [84] 84 (12/29 1536) Resp:  [17] 17 (12/29 1536) BP: (126)/(79) 126/79 (12/29 1536) SpO2:  [96 %] 96 % (12/29 1536) Blood pressure 126/79, pulse 84, temperature 97.8 F (36.6 C), resp. rate 17, SpO2 96%. There is no height or weight on file to calculate BMI.  Physical Exam HENT:     Head: Normocephalic.     Nose: Nose normal.  Eyes:     Pupils: Pupils are equal, round, and reactive to light.  Cardiovascular:     Rate and Rhythm: Normal rate.  Pulmonary:     Effort: Pulmonary effort is normal.  Musculoskeletal:        General: Normal range of motion.     Cervical back: Normal range of motion.  Neurological:     General: No focal deficit present.     Mental Status: He is alert.  Psychiatric:        Mood and Affect: Mood normal.        Behavior: Behavior normal.        Thought Content: Thought content normal.        Judgment: Judgment normal.     Mental Status Exam: General Appearance:  hosptial clothing   Orientation:  Full (Time, Place, and Person)  Memory:  Recent;   Good  Concentration:  Concentration: Good  Recall:  Good  Attention  Good  Eye Contact:  Good  Speech:  Clear and Coherent  Language:  Good  Volume:  Normal  Mood: Very nonchalant, obsessive, fixated  Affect:  Blunt and Labile  Thought Process:  Goal Directed and Linear  Thought Content:  Illogical and Obsessions  Suicidal Thoughts:  Yes.  with intent/plan  Homicidal Thoughts:  Yes.  without intent/plan  Judgement:  Poor  Insight:  Lacking  Psychomotor Activity:  Mannerisms  Akathisia:   NA  Fund of Knowledge:  Fair      Assets:  Desire for Improvement Intimacy Resilience  Cognition:  WNL  ADL's:  Intact  AIMS (if indicated):        Other History   These have been pulled in through the EMR, reviewed, and updated if appropriate.  Family History:  The patient's family history includes Anxiety disorder in his father and mother; Depression in his mother; Diabetes in his mother; Hyperlipidemia in his father; Hypertension in his father and mother; Hypothyroidism in his mother; Lymphoma in his maternal  grandfather.  Medical History: Past Medical History:  Diagnosis Date   Anxiety    Asthma    Chronic bronchitis (HCC)    Panic attacks     Surgical History: Past Surgical History:  Procedure Laterality Date   TONSILLECTOMY       Medications:  No current facility-administered medications for this encounter.  Current Outpatient Medications:    albuterol (VENTOLIN HFA) 108 (90 Base) MCG/ACT inhaler, Inhale 1-2 puffs into the lungs every 6 (six) hours as needed for wheezing or shortness of breath., Disp: 1 each, Rfl: 0   atorvastatin (LIPITOR) 10 MG tablet, Take 1 tablet (10 mg total) by mouth daily., Disp: 90 tablet, Rfl: 3   doxepin (SINEQUAN) 25 MG capsule, Take 2 capsules (50 mg total) by mouth at bedtime., Disp: 60 capsule, Rfl: 0   FLUoxetine (PROZAC) 20 MG capsule, Take 3 capsules (60 mg total) by mouth daily., Disp: 90 capsule, Rfl: 0   gabapentin (NEURONTIN) 100 MG capsule, Take 2 capsules (200 mg total) by mouth 3 (three) times daily., Disp: 180 capsule, Rfl: 0   hydrOXYzine (ATARAX) 25 MG tablet, Take 3 tablets (75 mg total) by mouth 3 (three) times daily as needed for anxiety., Disp: 30 tablet, Rfl: 0   losartan (COZAAR) 100 MG tablet, Take 1 tablet (100 mg total) by mouth daily after breakfast., Disp: 90 tablet, Rfl: 3   metoprolol succinate (TOPROL-XL) 50 MG 24 hr tablet, Take 1 tablet (50 mg total) by mouth daily., Disp: 90 tablet, Rfl: 3   risperiDONE  (RISPERDAL) 2 MG tablet, Take 2 tablets (4 mg total) by mouth at bedtime., Disp: 60 tablet, Rfl: 0   risperiDONE microspheres (RISPERDAL CONSTA) 50 MG injection, Inject 1 mL (25 mg total) into the muscle every 14 (fourteen) days for 4 doses., Disp: 1 each, Rfl: 1   traZODone (DESYREL) 100 MG tablet, Take 1 tablet (100 mg total) by mouth at bedtime as needed for sleep., Disp: 30 tablet, Rfl: 0  Allergies: Allergies  Allergen Reactions   Codeine     Heart racing      Penicillins     Anaphylaxis     Sindy Guadeloupe, NP

## 2023-11-02 NOTE — BH Assessment (Signed)
Comprehensive Clinical Assessment (CCA) Screening, Triage and Referral Note  11/02/2023 David Salinas [id number]  Lorenda Hatchet, 32 year old male who presents to [facility] ED involuntarily for treatment. Per triage note, Pt c/o increased suicidal thoughts. Pt takes 60mg  of Prozac for depression. Pt reports specific thoughts of killing himself such as going to his grandparents' house and hanging himself. He says he has thought about killing neighbors, without a specific plan. Pt endorses auditory hallucinations. Pt says he takes Risperdal and is compliant. Pt states he doesn't feel safe outside of the hospital right now.   During TTS assessment pt presents alert and oriented x 4, restless but cooperative, and mood-congruent with affect. The pt does not appear to be responding to internal or external stimuli. Neither is the pt presenting with any delusional thinking. Pt verified the information provided to triage RN.   Pt identifies his main complaint to be that he is not doing so good. Patient reports having suicidal thoughts with plan to hang himself and homicidal thoughts of wanting to kill his neighbor for no apparent reason. Patient also mentioned that he feels like raping people when he talks to them. Patient reports having this feeling as a release to fulfill his sexual needs. Patient goes on to mention that he is thankful for not owning a gun as he would have already used it. Patient reports hearing voices that command him to go through with these acts. Patient denies using any illicit substances and alcohol. Patient reports he is not working and has not been able to hold a job with all of his problems. Patient states he is compliant with his medications but feels as though they are not working. Patient is being followed by psychiatrist, Dr. Flavia Shipper at [facility] where he has an upcoming appointment on Nov 14, 2023. Patient states he does not feel safe at home and only feels good when he is admitted to a  facility. Patient was recently discharged from [facility], December 2024. Pt is unable to contract for safety.    Per Channing Mutters, NP, pt is recommended for inpatient psychiatric admission.    Chief Complaint:  Chief Complaint  Patient presents with   Suicidal   Visit Diagnosis: Suicidal ideation  Patient Reported Information How did you hear about [place]? Self  What Is the Reason for Your Visit/Call Today? Patient reports suicidal and homicidal ideations.  How Long Has This Been Causing You Problems? > than 6 months  What Do You Feel Would Help You the Most Today? Medication(s); Treatment for Depression or other mood problem   Have You Recently Had Any Thoughts About Hurting Yourself? Yes  Are You Planning to Commit Suicide/Harm Yourself At This time? Yes   Have you Recently Had Thoughts About Hurting Someone Karolee Ohs? Yes  Are You Planning to Harm Someone at This Time? No  Explanation: No data recorded  Have You Used Any Alcohol or Drugs in the Past 24 Hours? No  How Long Ago Did You Use Drugs or Alcohol? No data recorded What Did You Use and How Much? No data recorded  Do You Currently Have a Therapist/Psychiatrist? Yes  Name of Therapist/Psychiatrist: [facility]- Dr. Flavia Shipper   Have You Been Recently Discharged From Any Office Practice or Programs? Yes  Explanation of Discharge From Practice/Program: [facility]- December 2024    CCA Screening Triage Referral Assessment Type of Contact: Face-to-Face  Telemedicine Service Delivery:   Is this Initial or Reassessment?   Date Telepsych consult ordered in [facility]:  Time Telepsych consult ordered in [facility]:    Location of Assessment: [facility] ED  Provider Location: [facility] ED    Collateral Involvement: None provided   Does Patient Have a Court Appointed Legal Guardian? No data recorded Name and Contact of Legal Guardian: No data recorded If Minor and Not Living with Parent(s), Who has Custody? No data recorded Is CPS involved or ever been involved?  Never  Is APS involved or ever been involved? Never   Patient Determined To Be At Risk for Harm To Self or Others Based on Review of Patient Reported Information or Presenting Complaint? Yes, for Self-Harm  Method: Plan with intent and identified person  Availability of Means: No access or NA  Intent: Intends to cause physical harm but not necessarily death  Notification Required: No data recorded Additional Information for Danger to Others Potential: No data recorded Additional Comments for Danger to Others Potential: No data recorded Are There Guns or Other Weapons in Your Home? No  Types of Guns/Weapons: No data recorded Are These Weapons Safely Secured?                            No  Who Could Verify You Are Able To Have These Secured: No data recorded Do You Have any Outstanding Charges, Pending Court Dates, Parole/Probation? No data recorded Contacted To Inform of Risk of Harm To Self or Others: No data recorded  Does Patient Present under Involuntary Commitment? No    [place] of Residence: [place]   Patient Currently Receiving the Following Services: Medication Management   Determination of Need: Emergent (2 hours)   Options For Referral: ED Visit; Inpatient Hospitalization; Medication Management   Disposition Recommendation per psychiatric provider: We recommend inpatient psychiatric hospitalization when medically cleared. Patient is under voluntary admission status at this time; please IVC if attempts to leave hospital.  Clerance Lav, Counselor, LCAS-A

## 2023-11-02 NOTE — BH Assessment (Signed)
Patient is to be admitted to [facility] on tomm 11/03/23 by Dr.  Marlou Porch .  Attending Physician will be Dr. Marlou Porch.   Patient has not been assigned to a room. Admission will be coordinated on tomm morning by [facility].

## 2023-11-02 NOTE — Progress Notes (Signed)
Will start Zyprexa 5 mg daily for continue hallucination.

## 2023-11-02 NOTE — ED Provider Notes (Signed)
[facility] Provider Note    Event Date/Time   First MD Initiated Contact with Patient 11/02/23 1552     (approximate)   History   Suicidal   HPI  David Salinas is a 32 y.o. male with history of depression and anxiety who presents with increasing intrusive thoughts over the last several weeks since he was most recently discharged from the hospital on 12/2.  He reports suicidal ideation including thoughts of hanging himself, as well as thoughts about killing his neighbors without a specific plan.  He states he has not done anything to harm himself or others since his discharge from the hospital.  He reports that he is compliant with all of his medications.  He denies any drug or alcohol use.  He denies any acute medical complaints.  I reviewed the past medical records.  The patient was admitted to inpatient psychiatry at the end of last month and discharged on 12/2 for suicidal ideation.   Physical Exam   Triage Vital Signs: ED Triage Vitals  Encounter Vitals Group     BP 11/02/23 1536 126/79     Systolic BP Percentile --      Diastolic BP Percentile --      Pulse Rate 11/02/23 1536 84     Resp 11/02/23 1536 17     Temp 11/02/23 1536 97.8 F (36.6 C)     Temp src --      SpO2 11/02/23 1536 96 %     Weight --      Height --      Head Circumference --      Peak Flow --      Pain Score 11/02/23 1538 0     Pain Loc --      Pain Education --      Exclude from Growth Chart --     Most recent vital signs: Vitals:   11/02/23 1536  BP: 126/79  Pulse: 84  Resp: 17  Temp: 97.8 F (36.6 C)  SpO2: 96%     General: Awake, no distress.  CV:  Good peripheral perfusion.  Resp:  Normal effort.  Abd:  No distention.  Other:  Calm and cooperative.   ED Results / Procedures / Treatments   Labs (all labs ordered are listed, but only abnormal results are displayed) Labs Reviewed  COMPREHENSIVE METABOLIC PANEL - Abnormal; Notable for the  following components:      Result Value   CO2 21 (*)    Glucose, Bld 101 (*)    All other components within normal limits  SALICYLATE LEVEL - Abnormal; Notable for the following components:   Salicylate Lvl <7.0 (*)    All other components within normal limits  ACETAMINOPHEN LEVEL - Abnormal; Notable for the following components:   Acetaminophen (Tylenol), Serum <10 (*)    All other components within normal limits  URINE DRUG SCREEN, QUALITATIVE ([facility] ONLY) - Abnormal; Notable for the following components:   Tricyclic, Ur Screen POSITIVE (*)    All other components within normal limits  ETHANOL  CBC     EKG   RADIOLOGY   PROCEDURES:  Critical Care performed: No  Procedures   MEDICATIONS ORDERED IN ED: Medications  OLANZapine (ZYPREXA) tablet 5 mg (5 mg Oral Given 11/02/23 1807)  OLANZapine zydis (ZYPREXA) disintegrating tablet 5 mg (has no administration in time range)    And  LORazepam (ATIVAN) tablet 1 mg (has no administration in time range)  And  ziprasidone (GEODON) injection 20 mg (has no administration in time range)     IMPRESSION / MDM / ASSESSMENT AND PLAN / ED COURSE  I reviewed the triage vital signs and the nursing notes.  32 year old male with PMH as noted above presents with suicidal and homicidal ideation with no specific plan.  Exam is unremarkable for acute findings.  At this time, the patient is calm and cooperative.  He states he feels safe as long as he is in the hospital.  Differential diagnosis includes, but is not limited to, major depressive disorder, other mood disorder.  BMP and CBC were obtained and are negative for acute findings.  APAP, ASA, and UDS are pending.  I have ordered psychiatry and TTS consults to evaluate the patient.  At this time based on my initial assessment, there is no indication for involuntary commitment.  Patient's presentation is most consistent with exacerbation of chronic illness.  The patient has been placed  in psychiatric observation due to the need to provide a safe environment for the patient while obtaining psychiatric consultation and evaluation, as well as ongoing medical and medication management to treat the patient's condition.  The patient has not been placed under full IVC at this time.   ----------------------------------------- 7:43 PM on 11/02/2023 -----------------------------------------  I consulted and discussed the case with the psychiatry NP who recommends inpatient admission.   FINAL CLINICAL IMPRESSION(S) / ED DIAGNOSES   Final diagnoses:  Suicidal ideation     Rx / DC Orders   ED Discharge Orders     None        Note:  This document was prepared using Dragon voice recognition software and may include unintentional dictation errors.    Dionne Bucy, MD 11/02/23 1943

## 2023-11-02 NOTE — ED Triage Notes (Signed)
Pt c/o increased suicidal thoughts. Pt takes 60mg  of prozac for depression. Pt reports specific thoughts of killing himself such as going to his grandparents house and hanging himself. He says he has thought about killing neighbors, without a specific plan. Pt endorses auditory hallucinations. Pt says he takes Risperdal and is compliant. Pt states he doesn't feel safe outside of the hospital right now.

## 2023-11-02 NOTE — ED Notes (Addendum)
Spoke to patients father who stated that patient was receiently inpatient downstairs for >2weeks; since home parents have noted that pt is sleeping more, sluggish, forgetful and appears overmedicated.  Father also stated that he is concerned with what Mr Herin has been watching on TV/Computer.  Father reported that Mr Baldera lives at home and has the support of himself, his wife/ pt's mother as well as his sister that drives pt to all his outpatient appointments at [facility].  Father also stated that pt's grandmother lives down the same road and is also very involved with pt & care.  Mr. Romine PCP is out of [place] and sees on a regular basis as well.

## 2023-11-02 NOTE — ED Provider Triage Note (Signed)
Emergency Medicine Provider Triage Evaluation Note  David Salinas , a 32 y.o. male  was evaluated in triage.  Pt complains of suicidal ideation and increased auditory hallucinations. History of OCD and depression, auditory hallucinations. Takes Risperdal and Prozac.  Review of Systems  Positive: SI, auditory hallucinations, generalized HI Negative:   Physical Exam  BP 126/79   Pulse 84   Temp 97.8 F (36.6 C)   Resp 17   SpO2 96%  Gen:   Awake, no distress   Resp:  Normal effort  MSK:   Moves extremities without difficulty  Other:    Medical Decision Making  Medically screening exam initiated at 3:39 PM.  Appropriate orders placed.  David Salinas was informed that the remainder of the evaluation will be completed by another provider, this initial triage assessment does not replace that evaluation, and the importance of remaining in the ED until their evaluation is complete.    Cameron Ali, PA-C 11/02/23 1540

## 2023-11-02 NOTE — ED Notes (Signed)
TTS at bedside with patient.   

## 2023-11-02 NOTE — ED Notes (Signed)
 Pt requested and received a warm blanket.

## 2023-11-03 ENCOUNTER — Encounter: Payer: Self-pay | Admitting: Psychiatric/Mental Health

## 2023-11-03 ENCOUNTER — Inpatient Hospital Stay
Admission: AD | Admit: 2023-11-03 | Discharge: 2023-11-07 | DRG: 885 | Disposition: A | Discharge status: Home / Self Care | Payer: 59 | Source: Intra-hospital | Attending: Psychiatry | Admitting: Psychiatry

## 2023-11-03 ENCOUNTER — Other Ambulatory Visit: Payer: Self-pay

## 2023-11-03 DIAGNOSIS — Z560 Unemployment, unspecified: Secondary | ICD-10-CM

## 2023-11-03 DIAGNOSIS — Z807 Family history of other malignant neoplasms of lymphoid, hematopoietic and related tissues: Secondary | ICD-10-CM | POA: Diagnosis not present

## 2023-11-03 DIAGNOSIS — Z79899 Other long term (current) drug therapy: Secondary | ICD-10-CM

## 2023-11-03 DIAGNOSIS — Z635 Disruption of family by separation and divorce: Secondary | ICD-10-CM

## 2023-11-03 DIAGNOSIS — Z8249 Family history of ischemic heart disease and other diseases of the circulatory system: Secondary | ICD-10-CM

## 2023-11-03 DIAGNOSIS — K5900 Constipation, unspecified: Secondary | ICD-10-CM | POA: Diagnosis present

## 2023-11-03 DIAGNOSIS — F329 Major depressive disorder, single episode, unspecified: Secondary | ICD-10-CM | POA: Insufficient documentation

## 2023-11-03 DIAGNOSIS — R45850 Homicidal ideations: Secondary | ICD-10-CM | POA: Diagnosis present

## 2023-11-03 DIAGNOSIS — Z880 Allergy status to penicillin: Secondary | ICD-10-CM

## 2023-11-03 DIAGNOSIS — Z83438 Family history of other disorder of lipoprotein metabolism and other lipidemia: Secondary | ICD-10-CM | POA: Diagnosis not present

## 2023-11-03 DIAGNOSIS — F411 Generalized anxiety disorder: Secondary | ICD-10-CM | POA: Diagnosis present

## 2023-11-03 DIAGNOSIS — F333 Major depressive disorder, recurrent, severe with psychotic symptoms: Secondary | ICD-10-CM | POA: Diagnosis present

## 2023-11-03 DIAGNOSIS — Z818 Family history of other mental and behavioral disorders: Secondary | ICD-10-CM

## 2023-11-03 DIAGNOSIS — J4489 Other specified chronic obstructive pulmonary disease: Secondary | ICD-10-CM | POA: Diagnosis present

## 2023-11-03 DIAGNOSIS — Z885 Allergy status to narcotic agent status: Secondary | ICD-10-CM | POA: Diagnosis not present

## 2023-11-03 DIAGNOSIS — Z5941 Food insecurity: Secondary | ICD-10-CM | POA: Diagnosis not present

## 2023-11-03 DIAGNOSIS — F429 Obsessive-compulsive disorder, unspecified: Secondary | ICD-10-CM | POA: Diagnosis present

## 2023-11-03 DIAGNOSIS — Z87891 Personal history of nicotine dependence: Secondary | ICD-10-CM | POA: Diagnosis not present

## 2023-11-03 DIAGNOSIS — F323 Major depressive disorder, single episode, severe with psychotic features: Secondary | ICD-10-CM

## 2023-11-03 DIAGNOSIS — R45851 Suicidal ideations: Secondary | ICD-10-CM | POA: Diagnosis present

## 2023-11-03 DIAGNOSIS — G4700 Insomnia, unspecified: Secondary | ICD-10-CM | POA: Diagnosis present

## 2023-11-03 DIAGNOSIS — Z833 Family history of diabetes mellitus: Secondary | ICD-10-CM

## 2023-11-03 DIAGNOSIS — F332 Major depressive disorder, recurrent severe without psychotic features: Secondary | ICD-10-CM | POA: Diagnosis not present

## 2023-11-03 MED ORDER — TRAZODONE HCL 100 MG PO TABS
100.0000 mg | ORAL_TABLET | Freq: Every day | ORAL | Status: DC
  Administered 2023-11-03: 100 mg via ORAL
  Filled 2023-11-03: qty 1

## 2023-11-03 MED ORDER — LORAZEPAM 1 MG PO TABS
1.0000 mg | ORAL_TABLET | ORAL | Status: DC | PRN
Start: 2023-11-03 — End: 2023-11-07

## 2023-11-03 MED ORDER — TRAZODONE HCL 100 MG PO TABS
100.0000 mg | ORAL_TABLET | Freq: Every day | ORAL | Status: DC
  Administered 2023-11-04 – 2023-11-06 (×3): 100 mg via ORAL
  Filled 2023-11-03 (×4): qty 1

## 2023-11-03 MED ORDER — TRAZODONE HCL 50 MG PO TABS: 50.0000 mg | ORAL_TABLET | Freq: Every evening | ORAL | Status: DC | PRN

## 2023-11-03 MED ORDER — OLANZAPINE 5 MG PO TBDP: 5.0000 mg | ORAL_TABLET | Freq: Three times a day (TID) | ORAL | Status: DC | PRN

## 2023-11-03 MED ORDER — RISPERIDONE 1 MG PO TABS
2.0000 mg | ORAL_TABLET | Freq: Every day | ORAL | Status: DC
  Administered 2023-11-04 – 2023-11-06 (×3): 2 mg via ORAL
  Filled 2023-11-03 (×4): qty 2

## 2023-11-03 MED ORDER — ACETAMINOPHEN 325 MG PO TABS: 650.0000 mg | ORAL_TABLET | Freq: Four times a day (QID) | ORAL | Status: DC | PRN

## 2023-11-03 MED ORDER — OLANZAPINE 5 MG PO TABS
5.0000 mg | ORAL_TABLET | Freq: Every day | ORAL | Status: DC
  Administered 2023-11-03: 5 mg via ORAL
  Filled 2023-11-03: qty 1

## 2023-11-03 MED ORDER — FLUOXETINE HCL 20 MG PO CAPS: 80.0000 mg | ORAL_CAPSULE | Freq: Every day | ORAL | Status: DC

## 2023-11-03 MED ORDER — OLANZAPINE 10 MG IM SOLR: 10.0000 mg | Freq: Three times a day (TID) | INTRAMUSCULAR | Status: DC | PRN

## 2023-11-03 MED ORDER — RISPERIDONE MICROSPHERES ER 37.5 MG IM SRER: 37.5000 mg | INTRAMUSCULAR | Status: DC

## 2023-11-03 MED ORDER — ZIPRASIDONE MESYLATE 20 MG IM SOLR: 20.0000 mg | INTRAMUSCULAR | Status: DC | PRN

## 2023-11-03 MED ORDER — HYDROXYZINE HCL 25 MG PO TABS
25.0000 mg | ORAL_TABLET | Freq: Three times a day (TID) | ORAL | Status: DC | PRN
  Administered 2023-11-04 – 2023-11-06 (×2): 25 mg via ORAL
  Filled 2023-11-03 (×2): qty 1

## 2023-11-03 MED ORDER — MAGNESIUM HYDROXIDE 400 MG/5ML PO SUSP
30.0000 mL | Freq: Every day | ORAL | Status: DC | PRN
  Administered 2023-11-06: 30 mL via ORAL
  Filled 2023-11-03: qty 30

## 2023-11-03 MED ORDER — RISPERIDONE MICROSPHERES ER 37.5 MG IM SRER
25.0000 mg | INTRAMUSCULAR | Status: DC
  Filled 2023-11-03 (×3): qty 2

## 2023-11-03 MED ORDER — FLUOXETINE HCL 20 MG PO CAPS
60.0000 mg | ORAL_CAPSULE | Freq: Every day | ORAL | Status: DC
  Administered 2023-11-03 – 2023-11-04 (×2): 60 mg via ORAL
  Filled 2023-11-03 (×2): qty 3

## 2023-11-03 MED ORDER — OLANZAPINE 10 MG IM SOLR: 5.0000 mg | Freq: Three times a day (TID) | INTRAMUSCULAR | Status: DC | PRN

## 2023-11-03 MED ORDER — RISPERIDONE 1 MG/ML PO SOLN: 1.0000 mg | Freq: Every day | ORAL | Status: DC

## 2023-11-03 MED ORDER — ALUM & MAG HYDROXIDE-SIMETH 200-200-20 MG/5ML PO SUSP: 30.0000 mL | ORAL | Status: DC | PRN

## 2023-11-03 NOTE — Plan of Care (Signed)
New admission.  Problem: Education: Goal: Knowledge of [facility] Education information/materials will improve Outcome: Not Progressing Goal: Emotional status will improve Outcome: Not Progressing Goal: Mental status will improve Outcome: Not Progressing Goal: Verbalization of understanding the information provided will improve Outcome: Not Progressing   Problem: Activity: Goal: Interest or engagement in activities will improve Outcome: Not Progressing Goal: Sleeping patterns will improve Outcome: Not Progressing   Problem: Coping: Goal: Ability to verbalize frustrations and anger appropriately will improve Outcome: Not Progressing Goal: Ability to demonstrate self-control will improve Outcome: Not Progressing   Problem: Health Behavior/Discharge Planning: Goal: Identification of resources available to assist in meeting health care needs will improve Outcome: Not Progressing Goal: Compliance with treatment plan for underlying cause of condition will improve Outcome: Not Progressing   Problem: Physical Regulation: Goal: Ability to maintain clinical measurements within normal limits will improve Outcome: Not Progressing   Problem: Safety: Goal: Periods of time without injury will increase Outcome: Not Progressing   Problem: Education: Goal: Will be free of psychotic symptoms Outcome: Not Progressing Goal: Knowledge of the prescribed therapeutic regimen will improve Outcome: Not Progressing   Problem: Coping: Goal: Coping ability will improve Outcome: Not Progressing   Problem: Health Behavior/Discharge Planning: Goal: Identification of resources available to assist in meeting health care needs will improve Outcome: Not Progressing   Problem: Self-Concept: Goal: Ability to disclose and discuss suicidal ideas will improve Outcome: Not Progressing   Problem: Coping: Goal: Coping ability will improve Outcome: Not Progressing Goal: Will verbalize  feelings Outcome: Not Progressing

## 2023-11-03 NOTE — Progress Notes (Signed)
   11/03/23 0845  Charting Type  Charting Type Admission  Safety Check Verification  Has the RN verified the 15 minute safety check completion? Yes  Neurological  Neuro (WDL) WDL  HEENT  HEENT (WDL) WDL  Respiratory  Respiratory (WDL) X (hx. Asthma)  Cardiac  Cardiac (WDL) WDL  Vascular  Vascular (WDL) WDL  Integumentary  Integumentary (WDL) X  Staff Member Assisting with Skin Assessment on Admission Eric, MHT  Skin Color Appropriate for ethnicity  Skin Condition Dry;Flaky (scalp and face)  Skin Integrity Intact;Other (Comment);Erythema/redness (multiple tattoos to chest and bilateral arms)  Erythema/Redness Location Arm;Back;Chest;Face (red, raised bumps)  Erythema/Redness Location Orientation Right;Left;Lower;Upper  Skin Turgor Non-tenting  Braden Scale (Ages 8 and up)  Sensory Perceptions 4  Moisture 4  Activity 4  Mobility 4  Nutrition 3  Friction and Shear 3  Braden Scale Score 22  Musculoskeletal  Musculoskeletal (WDL) WDL  Assistive Device None  Gastrointestinal  Gastrointestinal (WDL) WDL  Last BM Date  11/02/23  GU Assessment  Genitourinary (WDL) WDL  Genitalia  Male Genitalia Intact  Neurological  Level of Consciousness Alert

## 2023-11-03 NOTE — Group Note (Signed)
Date:  11/03/2023 Time:  2:54 PM  Group Topic/Focus:  Activity Group:  The focus of the group is to encourage patients to go outside in the courtyard for some fresh air and some exercise to benefit their physical and mental being.    Participation Level:  Did Not Attend   David Salinas 11/03/2023, 2:54 PM

## 2023-11-03 NOTE — Progress Notes (Signed)
Patient reports that "I don't think I stayed long enough last time". Patient states that he has been medication compliant since he discharged, but "still the same things are going on". Patient states that his goals for treatment are to "try to learn to manage my intrusive thoughts".

## 2023-11-03 NOTE — [facility] Suicide Risk Assessment (Signed)
[facility] INPATIENT:  Family/Significant Other Suicide Prevention Education  Suicide Prevention Education:  Contact Attempts: Esau Grew, sister, [phone], has been identified by the patient as the family member/significant other with whom the patient will be residing, and identified as the person(s) who will aid the patient in the event of a mental health crisis.  With written consent from the patient, two attempts were made to provide suicide prevention education, prior to and/or following the patient's discharge.  We were unsuccessful in providing suicide prevention education.  A suicide education pamphlet was given to the patient to share with family/significant other.  Mailbox is full, unable to accept any messages. CSW could not leave HIPAA compliant voicemail.   Date and time of first attempt:10/24/23 at 2:15 PM.  Second attempt is needed.   Lowry Ram 11/03/2023, 2:17 PM
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
## 2023-11-03 NOTE — BH IP Treatment Plan (Addendum)
Interdisciplinary Treatment and Diagnostic Plan Update  11/03/2023 Time of Session: 09:16 David Salinas MRN: [id number]  Principal Diagnosis: MDD (major depressive disorder)  Secondary Diagnoses: Principal Problem:   MDD (major depressive disorder)   Current Medications:  Current Facility-Administered Medications  Medication Dose Route Frequency Provider Last Rate Last Admin   acetaminophen (TYLENOL) tablet 650 mg  650 mg Oral Q6H PRN Jearld Lesch, NP       alum & mag hydroxide-simeth (MAALOX/MYLANTA) 200-200-20 MG/5ML suspension 30 mL  30 mL Oral Q4H PRN Jearld Lesch, NP       hydrOXYzine (ATARAX) tablet 25 mg  25 mg Oral TID PRN Jearld Lesch, NP       OLANZapine zydis (ZYPREXA) disintegrating tablet 5 mg  5 mg Oral Q8H PRN Dixon, Rashaun M, NP       And   LORazepam (ATIVAN) tablet 1 mg  1 mg Oral PRN Jearld Lesch, NP       And   ziprasidone (GEODON) injection 20 mg  20 mg Intramuscular PRN Dixon, Rashaun M, NP       magnesium hydroxide (MILK OF MAGNESIA) suspension 30 mL  30 mL Oral Daily PRN Jearld Lesch, NP       OLANZapine (ZYPREXA) injection 10 mg  10 mg Intramuscular TID PRN Jearld Lesch, NP       OLANZapine (ZYPREXA) injection 5 mg  5 mg Intramuscular TID PRN Jearld Lesch, NP       OLANZapine (ZYPREXA) tablet 5 mg  5 mg Oral Daily Dixon, Rashaun M, NP       OLANZapine zydis (ZYPREXA) disintegrating tablet 5 mg  5 mg Oral TID PRN Jearld Lesch, NP       traZODone (DESYREL) tablet 100 mg  100 mg Oral QHS Dixon, Rashaun M, NP       traZODone (DESYREL) tablet 50 mg  50 mg Oral QHS PRN Jearld Lesch, NP       PTA Medications: Medications Prior to Admission  Medication Sig Dispense Refill Last Dose/Taking   albuterol (VENTOLIN HFA) 108 (90 Base) MCG/ACT inhaler Inhale 1-2 puffs into the lungs every 6 (six) hours as needed for wheezing or shortness of breath. 1 each 0    atorvastatin (LIPITOR) 10 MG tablet Take 1 tablet (10 mg total) by mouth  daily. 90 tablet 3    doxepin (SINEQUAN) 25 MG capsule Take 2 capsules (50 mg total) by mouth at bedtime. 60 capsule 0    FLUoxetine (PROZAC) 20 MG capsule Take 3 capsules (60 mg total) by mouth daily. 90 capsule 0    gabapentin (NEURONTIN) 100 MG capsule Take 2 capsules (200 mg total) by mouth 3 (three) times daily. 180 capsule 0    hydrOXYzine (ATARAX) 25 MG tablet Take 3 tablets (75 mg total) by mouth 3 (three) times daily as needed for anxiety. 30 tablet 0    losartan (COZAAR) 100 MG tablet Take 1 tablet (100 mg total) by mouth daily after breakfast. 90 tablet 3    metoprolol succinate (TOPROL-XL) 50 MG 24 hr tablet Take 1 tablet (50 mg total) by mouth daily. 90 tablet 3    risperiDONE (RISPERDAL) 2 MG tablet Take 2 tablets (4 mg total) by mouth at bedtime. 60 tablet 0    risperiDONE microspheres (RISPERDAL CONSTA) 50 MG injection Inject 1 mL (25 mg total) into the muscle every 14 (fourteen) days for 4 doses. (Patient not taking: Reported on 11/03/2023) 1 each 1  traZODone (DESYREL) 100 MG tablet Take 1 tablet (100 mg total) by mouth at bedtime as needed for sleep. 30 tablet 0     Patient Stressors:    Patient Strengths:    Treatment Modalities: Medication Management, Group therapy, Case management,  1 to 1 session with clinician, Psychoeducation, Recreational therapy.   Physician Treatment Plan for Primary Diagnosis: MDD (major depressive disorder) Long Term Goal(s):     Short Term Goals:    Medication Management: Evaluate patient's response, side effects, and tolerance of medication regimen.  Therapeutic Interventions: 1 to 1 sessions, Unit Group sessions and Medication administration.  Evaluation of Outcomes: Not Met  Physician Treatment Plan for Secondary Diagnosis: Principal Problem:   MDD (major depressive disorder)  Long Term Goal(s):     Short Term Goals:       Medication Management: Evaluate patient's response, side effects, and tolerance of medication  regimen.  Therapeutic Interventions: 1 to 1 sessions, Unit Group sessions and Medication administration.  Evaluation of Outcomes: Not Met   RN Treatment Plan for Primary Diagnosis: MDD (major depressive disorder) Long Term Goal(s): Knowledge of disease and therapeutic regimen to maintain health will improve  Short Term Goals: Ability to remain free from injury will improve, Ability to verbalize frustration and anger appropriately will improve, Ability to demonstrate self-control, Ability to participate in decision making will improve, Ability to verbalize feelings will improve, Ability to disclose and discuss suicidal ideas, Ability to identify and develop effective coping behaviors will improve, and Compliance with prescribed medications will improve  Medication Management: RN will administer medications as ordered by provider, will assess and evaluate patient's response and provide education to patient for prescribed medication. RN will report any adverse and/or side effects to prescribing provider.  Therapeutic Interventions: 1 on 1 counseling sessions, Psychoeducation, Medication administration, Evaluate responses to treatment, Monitor vital signs and CBGs as ordered, Perform/monitor CIWA, COWS, AIMS and Fall Risk screenings as ordered, Perform wound care treatments as ordered.  Evaluation of Outcomes: Not Met   LCSW Treatment Plan for Primary Diagnosis: MDD (major depressive disorder) Long Term Goal(s): Safe transition to appropriate next level of care at discharge, Engage patient in therapeutic group addressing interpersonal concerns.  Short Term Goals: Engage patient in aftercare planning with referrals and resources, Increase social support, Increase ability to appropriately verbalize feelings, Increase emotional regulation, Facilitate acceptance of mental health diagnosis and concerns, and Increase skills for wellness and recovery  Therapeutic Interventions: Assess for all discharge  needs, 1 to 1 time with Social worker, Explore available resources and support systems, Assess for adequacy in community support network, Educate family and significant other(s) on suicide prevention, Complete Psychosocial Assessment, Interpersonal group therapy.  Evaluation of Outcomes: Not Met   Progress in Treatment: Attending groups: No. Participating in groups: No. Taking medication as prescribed: Yes. Toleration medication: Yes. Family/Significant other contact made: No, will contact:  when given permission.  Patient understands diagnosis: Yes. Discussing patient identified problems/goals with staff: Yes. Medical problems stabilized or resolved: Yes. Denies suicidal/homicidal ideation: Yes. Issues/concerns per patient self-inventory: No. Other: none.  New problem(s) identified: No, Describe:  none identified.  New Short Term/Long Term Goal(s): medication management for mood stabilization; elimination of SI thoughts; development of comprehensive mental wellness plan.  Patient Goals:  "Trying to function better in society."  Discharge Plan or Barriers: CSW will assist pt with development of an appropriate aftercare/discharge plan.   Reason for Continuation of Hospitalization: Depression Homicidal ideation Medication stabilization Suicidal ideation  Estimated Length of Stay:  1-7 days  Last 3 [place] Suicide Severity Risk Score: Flowsheet Row ED from 11/02/2023 in [facility] Emergency Department at [facility] Admission (Discharged) from 09/24/2023 in [facility] INPATIENT BEH[facility] MEDICINE ED from 09/23/2023 in [facility] Emergency Department at [facility]  C-SSRS RISK CATEGORY High Risk High Risk High Risk       Last [facility] 2/9 Scores:    10/07/2023   11:14 AM  Depression screen PHQ 2/9  Decreased Interest 3  Down, Depressed, Hopeless 0  PHQ - 2 Score 3  Altered sleeping 0  Tired, decreased energy 0  Change in appetite 0  Feeling bad or failure about  yourself  0  Trouble concentrating 3  Moving slowly or fidgety/restless 0  Suicidal thoughts 0  PHQ-9 Score 6  Difficult doing work/chores Extremely dIfficult    Scribe for Treatment Team: Glenis Smoker, LCSW 11/03/2023 9:50 AM

## 2023-11-03 NOTE — Group Note (Signed)
Recreation Therapy Group Note   Group Topic:Coping Skills  Group Date: 11/03/2023 Start Time: 1000 End Time: 1050 Facilitators: Rosina Lowenstein, LRT, CTRS Location:  Craft Room  Group Description: Mind Map.  Patient was provided a blank template of a diagram with 32 blank boxes in a tiered system, branching from the center (similar to a bubble chart). LRT directed patients to label the middle of the diagram "Coping Skills". LRT and patients then came up with 8 different coping skills as examples. Pt were directed to record their coping skills in the 2nd tier boxes closest to the center.  Patients would then share their coping skills with the group as LRT wrote them out. LRT gave a handout of 99 different coping skills at the end of group.   Goal Area(s) Addressed: Patients will be able to define "coping skills". Patient will identify new coping skills.  Patient will increase communication.   Affect/Mood: N/A   Participation Level: Did not attend    Clinical Observations/Individualized Feedback: Patient did not attend group.   Plan: Continue to engage patient in RT group sessions 2-3x/week.   Rosina Lowenstein, LRT, CTRS 11/03/2023 1:01 PM

## 2023-11-03 NOTE — H&P (Signed)
Psychiatric Admission Assessment Adult  Patient Identification: David Salinas MRN:  [id number] Date of Evaluation:  11/03/2023 Chief Complaint:  MDD (major depressive disorder) [F32.9] Principal Diagnosis: Major depressive disorder, single episode, severe with psychotic features (HCC) Diagnosis:  Principal Problem:   Major depressive disorder, single episode, severe with psychotic features (HCC) Active Problems:   Suicidal ideation   Homicidal ideations  History of Present Illness: 32 year old Caucasian male presenting involuntarily to the ED for worsening suicidal and homicidal ideation, as well as disturbing intrusive thoughts. He reports specific suicidal thoughts, including a plan to go to his grandparents' house and hang himself. Additionally, he endorses homicidal thoughts about killing his neighbor without a specific reason or plan and describes intrusive thoughts of sexually violent acts, which he attributes to an unfulfilled need for emotional release and sexual gratification. He acknowledges auditory hallucinations commanding him to act on these impulses.The patient states he takes Prozac 60 mg daily for depression and Risperdal (dose unspecified) for mood stabilization and psychotic symptoms, but feels the medications are not effective. He denies illicit drug and alcohol use, though he reports being unemployed and unable to maintain a job due to his mental health. He lives with his parents and feels unsafe outside the hospital, expressing that the only time he feels stable is during hospitalization. He was recently discharged from [facility] in November 2024 and is followed by a psychiatrist at [facility], with an upcoming appointment scheduled for November 14, 2023.During the interview, the patient was nonchalant and provided fluid and coherent responses but displayed a disturbing and preoccupied thought process. He lacks insight into the severity of his statements and does not exhibit remorse about  his reported thoughts. He denies any delusions, overt response to internal stimuli, or paranoia. He is unable to contract for safety. Associated Signs/Symptoms: Depression Symptoms:  depressed mood, anhedonia, insomnia, difficulty concentrating, hopelessness, impaired memory, suicidal thoughts without plan, loss of energy/fatigue, (Hypo) Manic Symptoms:  Hallucinations, Impulsivity, Sexually Inapproprite Behavior, Anxiety Symptoms:  Excessive Worry, Psychotic Symptoms:  Hallucinations: Auditory PTSD Symptoms: Negative Total Time spent with patient: 1.5 hours  Past Psychiatric History: MDD   Is the patient at risk to self? Yes.    Has the patient been a risk to self in the past 6 months? Yes.    Has the patient been a risk to self within the distant past? Yes.    Is the patient a risk to others? Yes.    Has the patient been a risk to others in the past 6 months? Yes.    Has the patient been a risk to others within the distant past? Yes.     [place] Scale:  Flowsheet Row Admission (Current) from 11/03/2023 in [facility] INPATIENT BEHAV[facility]AL MEDICINE ED from 11/02/2023 in [facility] Emergency Department at [facility] Admission (Discharged) from 09/24/2023 in [facility] INPATIENT BEHAVIORAL MEDICINE  C-SSRS RISK CATEGORY High Risk High Risk High Risk        Prior Inpatient Therapy: Yes.   If yes, describe [facility]  Prior Outpatient Therapy: Yes.   If yes, describe [facility]   Alcohol Screening: 1. How often do you have a drink containing alcohol?: Never 2. How many drinks containing alcohol do you have on a typical day when you are drinking?: 1 or 2 3. How often do you have six or more drinks on one occasion?: Never AUDIT-C Score: 0 4. How often during the last year have you found that you were not able to stop drinking once you had started?:  Never 5. How often during the last year have you failed to do what was normally expected from you because of drinking?: Never 6. How often during the  last year have you needed a first drink in the morning to get yourself going after a heavy drinking session?: Never 7. How often during the last year have you had a feeling of guilt of remorse after drinking?: Never 8. How often during the last year have you been unable to remember what happened the night before because you had been drinking?: Never 9. Have you or someone else been injured as a result of your drinking?: No 10. Has a relative or friend or a doctor or another health worker been concerned about your drinking or suggested you cut down?: No Alcohol Use Disorder Identification Test Final Score (AUDIT): 0 Substance Abuse History in the last 12 months:  No. Consequences of Substance Abuse: Negative Previous Psychotropic Medications: Yes  Psychological Evaluations: Yes  Past Medical History:  Past Medical History:  Diagnosis Date   Anxiety    Asthma    Chronic bronchitis (HCC)    Panic attacks     Past Surgical History:  Procedure Laterality Date   TONSILLECTOMY     Family History:  Family History  Problem Relation Age of Onset   Anxiety disorder Mother    Depression Mother    Hypertension Mother    Diabetes Mother    Hypothyroidism Mother    Anxiety disorder Father    Hypertension Father    Hyperlipidemia Father    Lymphoma Maternal Grandfather    Family Psychiatric  History: See above Tobacco Screening:  Social History   Tobacco Use  Smoking Status Former   Current packs/day: 0.50   Types: Cigarettes  Smokeless Tobacco Never    BH Tobacco Counseling     Are you interested in Tobacco Cessation Medications?  N/A, patient does not use tobacco products Counseled patient on smoking cessation:  N/A, patient does not use tobacco products Reason Tobacco Screening Not Completed: No value filed.       Social History:  Social History   Substance and Sexual Activity  Alcohol Use Not Currently     Social History   Substance and Sexual Activity  Drug Use No     Additional Social History: Marital status: Single Separated, when?: Patient reports being seperated for 7 years. What types of issues is patient dealing with in the relationship?: Patient reports that they dealth with communication issues. Additional relationship information: None reported. Are you sexually active?: No What is your sexual orientation?: Pt reports he is bisexual. Has your sexual activity been affected by drugs, alcohol, medication, or emotional stress?: "Yes, just my attractions are different than most people." Does patient have children?: No                         Allergies:   Allergies  Allergen Reactions   Codeine     Heart racing      Penicillins     Anaphylaxis    Lab Results:  Results for orders placed or performed during the hospital encounter of 11/02/23 (from the past 48 hours)  Comprehensive metabolic panel     Status: Abnormal   Collection Time: 11/02/23  3:41 PM  Result Value Ref Range   Sodium 137 135 - 145 mmol/L   Potassium 4.0 3.5 - 5.1 mmol/L   Chloride 103 98 - 111 mmol/L   CO2 21 (L) 22 -  32 mmol/L   Glucose, Bld 101 (H) 70 - 99 mg/dL    Comment: Glucose reference range applies only to samples taken after fasting for at least 8 hours.   BUN 19 6 - 20 mg/dL   Creatinine, Ser 2.95 0.61 - 1.24 mg/dL   Calcium 9.6 8.9 - 28.4 mg/dL   Total Protein 8.1 6.5 - 8.1 g/dL   Albumin 4.7 3.5 - 5.0 g/dL   AST 16 15 - 41 U/L   ALT 34 0 - 44 U/L   Alkaline Phosphatase 68 38 - 126 U/L   Total Bilirubin 0.5 <1.2 mg/dL   GFR, Estimated >13 >24 mL/min    Comment: (NOTE) Calculated using the CKD-EPI Creatinine Equation (2021)    Anion gap 13 5 - 15    Comment: Performed at [facility], [address], [place], [place] [address]  Ethanol     Status: None   Collection Time: 11/02/23  3:41 PM  Result Value Ref Range   Alcohol, Ethyl (B) <10 <10 mg/dL    Comment: (NOTE) Lowest detectable limit for serum alcohol is 10 mg/dL.  For  medical purposes only. Performed at [facility], [address] [place], [place] [address]   Salicylate level     Status: Abnormal   Collection Time: 11/02/23  3:41 PM  Result Value Ref Range   Salicylate Lvl <7.0 (L) 7.0 - 30.0 mg/dL    Comment: Performed at [facility], [address] [place], [place] [address]  Acetaminophen level     Status: Abnormal   Collection Time: 11/02/23  3:41 PM  Result Value Ref Range   Acetaminophen (Tylenol), Serum <10 (L) 10 - 30 ug/mL    Comment: (NOTE) Therapeutic concentrations vary significantly. A range of 10-30 ug/mL  may be an effective concentration for many patients. However, some  are best treated at concentrations outside of this range. Acetaminophen concentrations >150 ug/mL at 4 hours after ingestion  and >50 ug/mL at 12 hours after ingestion are often associated with  toxic reactions.  Performed at [facility], [address] [place], [place] [address]   cbc     Status: None   Collection Time: 11/02/23  3:41 PM  Result Value Ref Range   WBC 10.3 4.0 - 10.5 K/uL   RBC 5.27 4.22 - 5.81 MIL/uL   Hemoglobin 15.7 13.0 - 17.0 g/dL   HCT 95.6 38.7 - 56.4 %   MCV 85.2 80.0 - 100.0 fL   MCH 29.8 26.0 - 34.0 pg   MCHC 35.0 30.0 - 36.0 g/dL   RDW 33.2 95.1 - 88.4 %   Platelets 366 150 - 400 K/uL   nRBC 0.0 0.0 - 0.2 %    Comment: Performed at [facility], [address] [place], [place] [address]  Urine Drug Screen, Qualitative     Status: Abnormal   Collection Time: 11/02/23  3:42 PM  Result Value Ref Range   Tricyclic, Ur Screen POSITIVE (A) NONE DETECTED   Amphetamines, Ur Screen NONE DETECTED NONE DETECTED   MDMA (Ecstasy)Ur Screen NONE DETECTED NONE DETECTED   Cocaine Metabolite,Ur [place] NONE DETECTED NONE DETECTED   Opiate, Ur Screen NONE DETECTED NONE DETECTED   Phencyclidine (PCP) Ur S NONE DETECTED NONE DETECTED   Cannabinoid 50 Ng, Ur [place] NONE DETECTED NONE DETECTED    Barbiturates, Ur Screen NONE DETECTED NONE DETECTED   Benzodiazepine, Ur Scrn NONE DETECTED NONE DETECTED   Methadone Scn, Ur NONE DETECTED NONE  DETECTED    Comment: (NOTE) Tricyclics + metabolites, urine    Cutoff 1000 ng/mL Amphetamines + metabolites, urine  Cutoff 1000 ng/mL MDMA (Ecstasy), urine              Cutoff 500 ng/mL Cocaine Metabolite, urine          Cutoff 300 ng/mL Opiate + metabolites, urine        Cutoff 300 ng/mL Phencyclidine (PCP), urine         Cutoff 25 ng/mL Cannabinoid, urine                 Cutoff 50 ng/mL Barbiturates + metabolites, urine  Cutoff 200 ng/mL Benzodiazepine, urine              Cutoff 200 ng/mL Methadone, urine                   Cutoff 300 ng/mL  The urine drug screen provides only a preliminary, unconfirmed analytical test result and should not be used for non-medical purposes. Clinical consideration and professional judgment should be applied to any positive drug screen result due to possible interfering substances. A more specific alternate chemical method must be used in order to obtain a confirmed analytical result. Gas chromatography / mass spectrometry (GC/MS) is the preferred confirm atory method. Performed at [facility], [address] [place], [place] [address]     Blood Alcohol level:  Lab Results  Component Value Date   [facility] <10 11/02/2023    Metabolic Disorder Labs:  Lab Results  Component Value Date   HGBA1C 5.9 (H) 09/27/2023   MPG 122.63 09/27/2023   MPG 100 03/16/2009   No results found for: "PROLACTIN" Lab Results  Component Value Date   CHOL 200 09/26/2023   TRIG 118 09/26/2023   HDL 23 (L) 09/26/2023   CHOLHDL 8.7 09/26/2023   VLDL 24 09/26/2023   LDLCALC 153 (H) 09/26/2023   LDLCALC (H) 03/16/2009    113        Total Cholesterol/HDL:CHD Risk Coronary Heart Disease Risk Table                     Men   Women  1/2 Average Risk   3.4   3.3  Average Risk       5.0   4.4  2 X Average Risk   9.6    7.1  3 X Average Risk  23.4   11.0        Use the calculated Patient Ratio above and the CHD Risk Table to determine the patient's CHD Risk.        ATP III CLASSIFICATION (LDL):  <100     mg/dL   Optimal  259-563  mg/dL   Near or Above                    Optimal  130-159  mg/dL   Borderline  875-643  mg/dL   High  >329     mg/dL   Very High    Current Medications: Current Facility-Administered Medications  Medication Dose Route Frequency Provider Last Rate Last Admin   acetaminophen (TYLENOL) tablet 650 mg  650 mg Oral Q6H PRN Dixon, Rashaun M, NP       alum & mag hydroxide-simeth (MAALOX/MYLANTA) 200-200-20 MG/5ML suspension 30 mL  30 mL Oral Q4H PRN Dixon, Rashaun M, NP       FLUoxetine (PROZAC) capsule 80 mg  80 mg Oral Daily Earlene Plater,  Lanette Hampshire, NP       hydrOXYzine (ATARAX) tablet 25 mg  25 mg Oral TID PRN Jearld Lesch, NP       OLANZapine zydis (ZYPREXA) disintegrating tablet 5 mg  5 mg Oral Q8H PRN Dixon, Rashaun M, NP       And   LORazepam (ATIVAN) tablet 1 mg  1 mg Oral PRN Jearld Lesch, NP       And   ziprasidone (GEODON) injection 20 mg  20 mg Intramuscular PRN Dixon, Rashaun M, NP       magnesium hydroxide (MILK OF MAGNESIA) suspension 30 mL  30 mL Oral Daily PRN Jearld Lesch, NP       OLANZapine (ZYPREXA) injection 10 mg  10 mg Intramuscular TID PRN Jearld Lesch, NP       OLANZapine (ZYPREXA) injection 5 mg  5 mg Intramuscular TID PRN Jearld Lesch, NP       OLANZapine zydis (ZYPREXA) disintegrating tablet 5 mg  5 mg Oral TID PRN Jearld Lesch, NP       risperiDONE (RISPERDAL) tablet 2 mg  2 mg Oral QHS Myriam Forehand, NP       risperiDONE microspheres (RISPERDAL CONSTA) injection 37.5 mg  37.5 mg Intramuscular Q14 Days Myriam Forehand, NP       traZODone (DESYREL) tablet 100 mg  100 mg Oral QHS Dixon, Rashaun M, NP       traZODone (DESYREL) tablet 50 mg  50 mg Oral QHS PRN Jearld Lesch, NP       PTA Medications: Medications Prior to Admission   Medication Sig Dispense Refill Last Dose/Taking   albuterol (VENTOLIN HFA) 108 (90 Base) MCG/ACT inhaler Inhale 1-2 puffs into the lungs every 6 (six) hours as needed for wheezing or shortness of breath. 1 each 0    atorvastatin (LIPITOR) 10 MG tablet Take 1 tablet (10 mg total) by mouth daily. 90 tablet 3    doxepin (SINEQUAN) 25 MG capsule Take 2 capsules (50 mg total) by mouth at bedtime. 60 capsule 0    FLUoxetine (PROZAC) 20 MG capsule Take 3 capsules (60 mg total) by mouth daily. 90 capsule 0    gabapentin (NEURONTIN) 100 MG capsule Take 2 capsules (200 mg total) by mouth 3 (three) times daily. 180 capsule 0    hydrOXYzine (ATARAX) 25 MG tablet Take 3 tablets (75 mg total) by mouth 3 (three) times daily as needed for anxiety. 30 tablet 0    losartan (COZAAR) 100 MG tablet Take 1 tablet (100 mg total) by mouth daily after breakfast. 90 tablet 3    metoprolol succinate (TOPROL-XL) 50 MG 24 hr tablet Take 1 tablet (50 mg total) by mouth daily. 90 tablet 3    risperiDONE (RISPERDAL) 2 MG tablet Take 2 tablets (4 mg total) by mouth at bedtime. 60 tablet 0    risperiDONE microspheres (RISPERDAL CONSTA) 50 MG injection Inject 1 mL (25 mg total) into the muscle every 14 (fourteen) days for 4 doses. (Patient not taking: Reported on 11/03/2023) 1 each 1    traZODone (DESYREL) 100 MG tablet Take 1 tablet (100 mg total) by mouth at bedtime as needed for sleep. 30 tablet 0     Musculoskeletal: Strength & Muscle Tone: within normal limits Gait & Station: normal Patient leans: N/A            Psychiatric Specialty Exam:  Presentation  General Appearance:  Fairly Groomed  Eye Contact: Minimal  Speech: Clear and Coherent  Speech Volume: Decreased  Handedness: Left   Mood and Affect  Mood: Dysphoric  Affect: Flat   Thought Process  Thought Processes: Disorganized  Duration of Psychotic Symptoms: 3 weeks Past Diagnosis of Schizophrenia or Psychoactive disorder:  No  Descriptions of Associations:Loose  Orientation:Full (Time, Place and Person)  Thought Content:Obsessions; Illogical; Rumination  Hallucinations:Hallucinations: Auditory Description of Auditory Hallucinations: "telling me to hurt someone"  Ideas of Reference:Delusions  Suicidal Thoughts:Suicidal Thoughts: Yes, Passive SI Active Intent and/or Plan: Without Intent; Without Plan; Without Means to Carry Out; Without Access to Means SI Passive Intent and/or Plan: Without Intent; Without Plan; Without Access to Means; Without Means to Carry Out  Homicidal Thoughts:Homicidal Thoughts: Yes, Passive HI Passive Intent and/or Plan: Without Intent; Without Plan; Without Means to Carry Out; Without Access to Means   Sensorium  Memory: Immediate Fair; Remote Fair  Judgment: Poor  Insight: Poor   Executive Functions  Concentration: Fair  Attention Span: Poor  Recall: Fair  Fund of Knowledge: Good  Language: Good   Psychomotor Activity  Psychomotor Activity: Psychomotor Activity: Normal   Assets  Assets: Communication Skills; Housing   Sleep  Sleep: Sleep: Fair Number of Hours of Sleep: 5    Physical Exam: Physical Exam ROS Blood pressure 122/73, pulse 77, temperature 97.8 F (36.6 C), temperature source Oral, resp. rate 16, height 5\' 6"  (1.676 m), weight 88.5 kg, SpO2 99%. Body mass index is 31.47 kg/m.  Treatment Plan Summary: Daily contact with patient to assess and evaluate symptoms and progress in treatment and Medication management Consider long-acting injectable (Risperdal Consta) for improved adherence. Risperdal (Risperidone) dose to 4 mg nightly, based on prior dosing and tolerance. Prozac (Fluoxetine) to 80 mg daily to target depressive symptoms. Lamotrigine 25 mg daily for mood stability  Engage the patient in structured therapy to address violent and intrusive thoughts. Evaluate for mood stabilizer (e.g., Lamotrigine 25 mg daily,  titrate to 100-200 mg) if mood instability persists. Provide psychoeducation regarding the importance of medication adherence. Patient agreed to begin Risperdal  Consta  25 mg IM for mood and intrusive thought Observation Level/Precautions:  Continuous Observation 15 minute checks  Laboratory:      Psychotherapy:    Medications:    Consultations:    Discharge Concerns:    Estimated LOS:  Other:     Physician Treatment Plan for Primary Diagnosis: Major depressive disorder, single episode, severe with psychotic features (HCC) Long Term Goal(s): Improvement in symptoms so as ready for discharge  Short Term Goals: Ability to identify changes in lifestyle to reduce recurrence of condition will improve, Ability to verbalize feelings will improve, Ability to disclose and discuss suicidal ideas, Ability to demonstrate self-control will improve, Ability to identify and develop effective coping behaviors will improve, Ability to maintain clinical measurements within normal limits will improve, Compliance with prescribed medications will improve, and Ability to identify triggers associated with substance abuse/mental health issues will improve  Physician Treatment Plan for Secondary Diagnosis: Principal Problem:   Major depressive disorder, single episode, severe with psychotic features (HCC) Active Problems:   Suicidal ideation   Homicidal ideations  Long Term Goal(s): Improvement in symptoms so as ready for discharge  Short Term Goals: Ability to identify changes in lifestyle to reduce recurrence of condition will improve, Ability to verbalize feelings will improve, Ability to disclose and discuss suicidal ideas, Ability to demonstrate self-control will improve, Ability to identify and develop effective coping behaviors will improve, Ability to maintain clinical measurements  within normal limits will improve, Compliance with prescribed medications will improve, and Ability to identify triggers  associated with substance abuse/mental health issues will improve  I certify that inpatient services furnished can reasonably be expected to improve the patient's condition.    Myriam Forehand, NP 12/30/20243:55 PM

## 2023-11-03 NOTE — Group Note (Signed)
[facility] LCSW Group Therapy Note    Group Date: 11/03/2023 Start Time: 1300 End Time: 1400  Type of Therapy and Topic:  Group Therapy:  Overcoming Obstacles  Participation Level:  [facility] PARTICIPATION LEVEL: Did Not Attend   Description of Group:   In this group patients will be encouraged to explore what they see as obstacles to their own wellness and recovery. They will be guided to discuss their thoughts, feelings, and behaviors related to these obstacles. The group will process together ways to cope with barriers, with attention given to specific choices patients can make. Each patient will be challenged to identify changes they are motivated to make in order to overcome their obstacles. This group will be process-oriented, with patients participating in exploration of their own experiences as well as giving and receiving support and challenge from other group members.  Therapeutic Goals: 1. Patient will identify personal and current obstacles as they relate to admission. 2. Patient will identify barriers that currently interfere with their wellness or overcoming obstacles.  3. Patient will identify feelings, thought process and behaviors related to these barriers. 4. Patient will identify two changes they are willing to make to overcome these obstacles:    Summary of Patient Progress X   Therapeutic Modalities:   Cognitive Behavioral Therapy Solution Focused Therapy Motivational Interviewing Relapse Prevention Therapy   Glenis Smoker, LCSW
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
## 2023-11-03 NOTE — ED Notes (Signed)
Patient came out of room and laid in floor Teacher, early years/pre witnessed). Patient got back up and went back to room when directed to do so.

## 2023-11-03 NOTE — Progress Notes (Addendum)
Received call from pharmacy as they were unable to get in contact with NP seeing pt earlier today. Per pharmacy, pt had been ordered risperdal consta 37.5mg  by NP seeing him earlier today. Pharmacy states they do not have this dose in stock at this time. H&P was reviewed and appears plan was to start risperdal consta 25mg  IM for mood and intrusive thought. H&P also notes that pt was to be started on risperdal 4mg  nightly. However appears that order reflects risperdal 2mg  oral daily at bedtime. Discussed with pharmacy will discontinue risperdal consta order at this time and pt will be re-evaluated by inpatient provider tomorrow for clarification on medications.

## 2023-11-03 NOTE — Group Note (Signed)
Recreation Therapy Group Note   Group Topic:Health and Wellness  Group Date: 11/03/2023 Start Time: 1530 End Time: 1610 Facilitators: Rosina Lowenstein, LRT, CTRS Location:  Dayroom  Group Description: Seated Exercise. LRT discussed the mental and physical benefits of exercise. LRT and group discussed how physical activity can be used as a coping skill. Pt's and LRT followed along to an exercise video on the TV screen that provided a visual representation and audio description of every exercise performed. Pt's encouraged to listen to their bodies and stop at any time if they experience feelings of discomfort or pain. Pts were encouraged to drink water and stay hydrated.   Goal Area(s) Addressed: Patient will learn benefits of physical activity. Patient will identify exercise as a coping skill.  Patient will follow multistep directions. Patient will try a new leisure interest.   Affect/Mood: N/A   Participation Level: Did not attend    Clinical Observations/Individualized Feedback: Taron did not attend group.   Plan: Continue to engage patient in RT group sessions 2-3x/week.   [address] LRT, CTRS 11/03/2023 5:11 PM

## 2023-11-03 NOTE — Group Note (Signed)
Date:  11/03/2023 Time:  8:46 PM  Group Topic/Focus:  Spirituality:   The focus of this group is to discuss how one's spirituality can aide in recovery.    Participation Level:  Did Not Attend  Participation Quality:   none  Affect:   none  Cognitive:   none  Insight: None  Engagement in Group:   none  Modes of Intervention:   none  Additional Comments:  none   Lenny Fiumara 11/03/2023, 8:46 PM

## 2023-11-03 NOTE — ED Notes (Signed)
Vol/consult done/Patient is to be admitted to [facility] on tomm 11/03/23 by Dr.  Marlou Porch .  Attending Physician will be Dr. Marlou Porch.   Patient has not been assigned to a room. Admission will be coordinated on tomm morning by [facility].
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
## 2023-11-04 DIAGNOSIS — F332 Major depressive disorder, recurrent severe without psychotic features: Secondary | ICD-10-CM | POA: Diagnosis not present

## 2023-11-04 MED ORDER — GABAPENTIN 100 MG PO CAPS
100.0000 mg | ORAL_CAPSULE | Freq: Three times a day (TID) | ORAL | Status: DC
  Administered 2023-11-04 – 2023-11-07 (×10): 100 mg via ORAL
  Filled 2023-11-04 (×10): qty 1

## 2023-11-04 MED ORDER — LOSARTAN POTASSIUM 50 MG PO TABS
100.0000 mg | ORAL_TABLET | Freq: Every day | ORAL | Status: DC
  Administered 2023-11-04 – 2023-11-07 (×4): 100 mg via ORAL
  Filled 2023-11-04 (×4): qty 2

## 2023-11-04 MED ORDER — PAROXETINE HCL 10 MG PO TABS
10.0000 mg | ORAL_TABLET | Freq: Every day | ORAL | Status: DC
  Administered 2023-11-05 – 2023-11-07 (×3): 10 mg via ORAL
  Filled 2023-11-04 (×3): qty 1

## 2023-11-04 MED ORDER — FLUOXETINE HCL 20 MG PO CAPS
40.0000 mg | ORAL_CAPSULE | Freq: Every day | ORAL | Status: DC
  Administered 2023-11-05 – 2023-11-07 (×3): 40 mg via ORAL
  Filled 2023-11-04 (×3): qty 2

## 2023-11-04 MED ORDER — METOPROLOL SUCCINATE ER 25 MG PO TB24
50.0000 mg | ORAL_TABLET | Freq: Every day | ORAL | Status: DC
  Administered 2023-11-04 – 2023-11-07 (×4): 50 mg via ORAL
  Filled 2023-11-04 (×4): qty 2

## 2023-11-04 NOTE — Group Note (Signed)
 Recreation Therapy Group Note   Group Topic:Goal Setting  Group Date: 11/04/2023 Start Time: 1000 End Time: 1100 Facilitators: Celestia Jeoffrey BRAVO, LRT, CTRS Location:  Craft Room  Group Description: Product/process Development Scientist. Patients were given many different magazines, a glue stick, markers, and a piece of cardstock paper. LRT and pts discussed the importance of having goals in life. LRT and pts discussed the difference between short-term and long-term goals, as well as what a SMART goal is. LRT encouraged pts to create a vision board, with images they picked and then cut out with safety scissors from the magazine, for themselves, that capture their short and long-term goals. LRT encouraged pts to show and explain their vision board to the group.   Goal Area(s) Addressed:  Patient will gain knowledge of short vs. long term goals.  Patient will identify goals for themselves. Patient will practice setting SMART goals. Patient will verbalize their goals to LRT and peers.   Affect/Mood: Appropriate   Participation Level: Active and Engaged   Participation Quality: Independent   Behavior: Appropriate, Calm, and Cooperative   Speech/Thought Process: Coherent   Insight: Fair   Judgement: Good   Modes of Intervention: Art, Education, Exploration, Guided Discussion, and Open Conversation   Patient Response to Interventions:  Attentive, Engaged, and Receptive   Education Outcome:  Acknowledges education   Clinical Observations/Individualized Feedback: David Salinas was active in their participation of session activities and group discussion. Pt identified I want to eat all of food today and go home soon as his goals. Pt shared that he slept through breakfast and lunch yesterday so he wanted to make it a goal to eat every meal today. Pt interacted well with LRT and peers duration of session.    Plan: Continue to engage patient in RT group sessions 2-3x/week.   Jeoffrey BRAVO Celestia, LRT, CTRS 11/04/2023  11:37 AM

## 2023-11-04 NOTE — Group Note (Signed)
 Recreation Therapy Group Note   Group Topic:Relaxation  Group Date: 11/04/2023 Start Time: 1530 End Time: 1610 Facilitators: Celestia Jeoffrey BRAVO, LRT, CTRS Location:  Dayroom  Group Description: Meditation. LRT and patients discussed what they know about meditation and mindfulness. LRT played a Deep Breathing Meditation exercise script for patients to follow along to. LRT and patients discussed how meditation and deep breathing can be used as a coping skill post--discharge to help manage symptoms of stress.   Goal Area(s) Addressed: Patient will practice using relaxation technique. Patient will identify a new coping skill.  Patient will follow multistep directions to reduce anxiety and stress.   Affect/Mood: Appropriate   Participation Level: Active and Engaged   Participation Quality: Independent   Behavior: Appropriate, Calm, and Cooperative   Speech/Thought Process: Coherent   Insight: Good   Judgement: Good   Modes of Intervention: Activity   Patient Response to Interventions:  Attentive, Engaged, and Receptive   Education Outcome:  Acknowledges education   Clinical Observations/Individualized Feedback: David Salinas was active in their participation of session activities and group discussion. Pt identified I did meditation in a temple one time but it was dead silent. I liked this one because it was guided and easy to follow. Pt followed along to the prompt appropriately. Pt interacted well with LRT and peers duration of session.    Plan: Continue to engage patient in RT group sessions 2-3x/week.   Jeoffrey BRAVO Celestia, LRT, CTRS 11/04/2023 5:11 PM

## 2023-11-04 NOTE — Group Note (Signed)
 LCSW Group Therapy Note  Group Date: 11/04/2023 Start Time: 1300 End Time: 1400   Type of Therapy and Topic:  Group Therapy: Anger Cues and Responses  Participation Level:  None   Description of Group:   In this group, patients learned how to recognize the physical, cognitive, emotional, and behavioral responses they have to anger-provoking situations.  They identified a recent time they became angry and how they reacted.  They analyzed how their reaction was possibly beneficial and how it was possibly unhelpful.  The group discussed a variety of healthier coping skills that could help with such a situation in the future.  Focus was placed on how helpful it is to recognize the underlying emotions to our anger, because working on those can lead to a more permanent solution as well as our ability to focus on the important rather than the urgent.  Therapeutic Goals: Patients will remember their last incident of anger and how they felt emotionally and physically, what their thoughts were at the time, and how they behaved. Patients will identify how their behavior at that time worked for them, as well as how it worked against them. Patients will explore possible new behaviors to use in future anger situations. Patients will learn that anger itself is normal and cannot be eliminated, and that healthier reactions can assist with resolving conflict rather than worsening situations.  Summary of Patient Progress:   Patient was present in group, however did not participate in group discussion.    Therapeutic Modalities:   Cognitive Behavioral Therapy    Sherryle JINNY Margo, LCSWA 11/04/2023  2:21 PM

## 2023-11-04 NOTE — Plan of Care (Signed)
 Pt new to the unit, hasn't had to progress  Problem: Education: Goal: Knowledge of [facility] Education information/materials will improve Outcome: Not Progressing Goal: Emotional status will improve Outcome: Not Progressing Goal: Mental status will improve Outcome: Not Progressing Goal: Verbalization of understanding the information provided will improve Outcome: Not Progressing   Problem: Activity: Goal: Interest or engagement in activities will improve Outcome: Not Progressing Goal: Sleeping patterns will improve Outcome: Not Progressing   Problem: Coping: Goal: Ability to verbalize frustrations and anger appropriately will improve Outcome: Not Progressing Goal: Ability to demonstrate self-control will improve Outcome: Not Progressing   Problem: Health Behavior/Discharge Planning: Goal: Identification of resources available to assist in meeting health care needs will improve Outcome: Not Progressing Goal: Compliance with treatment plan for underlying cause of condition will improve Outcome: Not Progressing   Problem: Physical Regulation: Goal: Ability to maintain clinical measurements within normal limits will improve Outcome: Not Progressing   Problem: Safety: Goal: Periods of time without injury will increase Outcome: Not Progressing   Problem: Education: Goal: Will be free of psychotic symptoms Outcome: Not Progressing Goal: Knowledge of the prescribed therapeutic regimen will improve Outcome: Not Progressing   Problem: Coping: Goal: Coping ability will improve Outcome: Not Progressing   Problem: Health Behavior/Discharge Planning: Goal: Identification of resources available to assist in meeting health care needs will improve Outcome: Not Progressing   Problem: Self-Concept: Goal: Ability to disclose and discuss suicidal ideas will improve Outcome: Not Progressing Goal: Will verbalize positive feelings about self Outcome: Not Progressing Note:  Patient is on track. Patient will maintain adherence    Problem: Coping: Goal: Coping ability will improve Outcome: Not Progressing Goal: Will verbalize feelings Outcome: Not Progressing

## 2023-11-04 NOTE — [facility] Suicide Risk Assessment (Signed)
 [facility] INPATIENT:  Family/Significant Other Suicide Prevention Education  Suicide Prevention Education:  Contact Attempts: Harlene Duncan/sister [phone]), has been identified by the patient as the family member/significant other with whom the patient will be residing, and identified as the person(s) who will aid the patient in the event of a mental health crisis.  With written consent from the patient, two attempts were made to provide suicide prevention education, prior to and/or following the patient's discharge.  We were unsuccessful in providing suicide prevention education.  A suicide education pamphlet was given to the patient to share with family/significant other.  Date and time of first attempt: 11/03/23 at 2:15 PM. Date and time of second attempt: 11/04/23 at 9:48 AM.  Mailbox is full, unable to accept any messages. CSW could not leave HIPAA compliant voicemail.   Nadara JONELLE Fam 11/04/2023, 9:50 AM

## 2023-11-04 NOTE — Progress Notes (Signed)
 Patient has been in his room, lying in bed, in the dark since lunch time. Patient remains safe on the unit and will continue to be monitored.

## 2023-11-04 NOTE — Progress Notes (Signed)
 [facility] MD Progress Note  11/04/2023 9:41 AM VASILIS LUHMAN  MRN:  [id number]  Subjective:  Progression team completed prior to assessment along with review of labs, vital signs, and notes. On rounds today, Malcolm reports feeling pretty good. He endorsed high anxiety, but believes it is mainly related to not having his blood pressure medication or his anxiety medication. Medications reconciled and started. Holger reported mild depression, but reports being in the inpatient environment helps these symptoms. Does confirm passive SI with no plan, he had a suicide attempt years ago with none since that time. He reports sleeping really good, slept through the medication time last night so that is why he did not take his medications last night. Currently denies auditory or visual hallucinations. Courtenay reports good appetite and currently denies any known medication side effects. Her plans to follow up with his regular psychiatrist Dr. Judy after discharge- he reports they have already made him a hospital follow-up appointment for January 13th. Discharged planned for this Friday if symptoms continue to improve.  Iris's main concern today is starting his blood pressure and anxiety medications back.  Principal Problem: Major depressive disorder, recurrent severe without psychotic features (HCC) Diagnosis: Principal Problem:   Major depressive disorder, recurrent severe without psychotic features (HCC) Active Problems:   Suicidal ideation   Homicidal ideations  Total Time spent with patient: 30 minutes  Past Psychiatric History: depression, intrusive thoughts, anxiety  Past Medical History:  Past Medical History:  Diagnosis Date   Anxiety    Asthma    Chronic bronchitis (HCC)    Panic attacks     Past Surgical History:  Procedure Laterality Date   TONSILLECTOMY     Family History:  Family History  Problem Relation Age of Onset   Anxiety disorder Mother    Depression Mother     Hypertension Mother    Diabetes Mother    Hypothyroidism Mother    Anxiety disorder Father    Hypertension Father    Hyperlipidemia Father    Lymphoma Maternal Grandfather    Family Psychiatric  History: see above Social History:  Social History   Substance and Sexual Activity  Alcohol Use Not Currently     Social History   Substance and Sexual Activity  Drug Use No    Social History   Socioeconomic History   Marital status: Legally Separated    Spouse name: Not on file   Number of children: Not on file   Years of education: Not on file   Highest education level: Not on file  Occupational History   Not on file  Tobacco Use   Smoking status: Former    Current packs/day: 0.50    Types: Cigarettes   Smokeless tobacco: Never  Vaping Use   Vaping status: Never Used  Substance and Sexual Activity   Alcohol use: Not Currently   Drug use: No   Sexual activity: Not on file  Other Topics Concern   Not on file  Social History Narrative   Not on file   Social Drivers of Health   Financial Resource Strain: Not on file  Food Insecurity: Food Insecurity Present (11/03/2023)   Hunger Vital Sign    Worried About Running Out of Food in the Last Year: Sometimes true    Ran Out of Food in the Last Year: Sometimes true  Transportation Needs: No Transportation Needs (11/03/2023)   PRAPARE - Administrator, Civil Service (Medical): No    Lack of Transportation (  Non-Medical): No  Physical Activity: Not on file  Stress: Not on file  Social Connections: Socially Isolated (10/07/2023)   Social Connection and Isolation Panel [NHANES]    Frequency of Communication with Friends and Family: More than three times a week    Frequency of Social Gatherings with Friends and Family: More than three times a week    Attends Religious Services: Never    Database Administrator or Organizations: No    Attends Engineer, Structural: Never    Marital Status: Separated    Additional Social History:                         Sleep: Good  Appetite:  Good  Current Medications: Current Facility-Administered Medications  Medication Dose Route Frequency Provider Last Rate Last Admin   acetaminophen  (TYLENOL ) tablet 650 mg  650 mg Oral Q6H PRN Melvenia Madelene HERO, NP       alum & mag hydroxide-simeth (MAALOX/MYLANTA) 200-200-20 MG/5ML suspension 30 mL  30 mL Oral Q4H PRN Melvenia Madelene HERO, NP       [START ON 11/05/2023] FLUoxetine  (PROZAC ) capsule 40 mg  40 mg Oral Daily Jacquetta Sharlot GRADE, NP       gabapentin  (NEURONTIN ) capsule 100 mg  100 mg Oral TID Jacquetta Sharlot GRADE, NP       hydrOXYzine  (ATARAX ) tablet 25 mg  25 mg Oral TID PRN Melvenia Madelene HERO, NP   25 mg at 11/04/23 0831   OLANZapine  zydis (ZYPREXA ) disintegrating tablet 5 mg  5 mg Oral Q8H PRN Melvenia Madelene HERO, NP       And   LORazepam  (ATIVAN ) tablet 1 mg  1 mg Oral PRN Melvenia Madelene HERO, NP       And   ziprasidone  (GEODON ) injection 20 mg  20 mg Intramuscular PRN Dixon, Rashaun M, NP       losartan  (COZAAR ) tablet 100 mg  100 mg Oral Daily Emmie Frakes Y, NP       magnesium  hydroxide (MILK OF MAGNESIA) suspension 30 mL  30 mL Oral Daily PRN Melvenia Madelene HERO, NP       metoprolol  succinate (TOPROL -XL) 24 hr tablet 50 mg  50 mg Oral Daily Jacquetta Sharlot GRADE, NP       OLANZapine  (ZYPREXA ) injection 10 mg  10 mg Intramuscular TID PRN Melvenia Madelene HERO, NP       OLANZapine  (ZYPREXA ) injection 5 mg  5 mg Intramuscular TID PRN Melvenia Madelene HERO, NP       OLANZapine  zydis (ZYPREXA ) disintegrating tablet 5 mg  5 mg Oral TID PRN Melvenia Madelene HERO, NP       PARoxetine  (PAXIL ) tablet 10 mg  10 mg Oral Daily Jacquetta Sharlot GRADE, NP       risperiDONE  (RISPERDAL ) tablet 2 mg  2 mg Oral QHS Nicholaus Brad RAMAN, NP       traZODone  (DESYREL ) tablet 100 mg  100 mg Oral QHS Melvenia Madelene HERO, NP        Lab Results:  Results for orders placed or performed during the hospital encounter of 11/02/23 (from the past 48 hours)   Comprehensive metabolic panel     Status: Abnormal   Collection Time: 11/02/23  3:41 PM  Result Value Ref Range   Sodium 137 135 - 145 mmol/L   Potassium 4.0 3.5 - 5.1 mmol/L   Chloride 103 98 - 111 mmol/L   CO2 21 (L) 22 - 32 mmol/L  Glucose, Bld 101 (H) 70 - 99 mg/dL    Comment: Glucose reference range applies only to samples taken after fasting for at least 8 hours.   BUN 19 6 - 20 mg/dL   Creatinine, Ser 8.85 0.61 - 1.24 mg/dL   Calcium  9.6 8.9 - 10.3 mg/dL   Total Protein 8.1 6.5 - 8.1 g/dL   Albumin 4.7 3.5 - 5.0 g/dL   AST 16 15 - 41 U/L   ALT 34 0 - 44 U/L   Alkaline Phosphatase 68 38 - 126 U/L   Total Bilirubin 0.5 <1.2 mg/dL   GFR, Estimated >39 >39 mL/min    Comment: (NOTE) Calculated using the CKD-EPI Creatinine Equation (2021)    Anion gap 13 5 - 15    Comment: Performed at [facility], [address], [place], [place] [address]  Ethanol     Status: None   Collection Time: 11/02/23  3:41 PM  Result Value Ref Range   Alcohol, Ethyl (B) <10 <10 mg/dL    Comment: (NOTE) Lowest detectable limit for serum alcohol is 10 mg/dL.  For medical purposes only. Performed at [facility], [address] [place], [place] [address]   Salicylate level     Status: Abnormal   Collection Time: 11/02/23  3:41 PM  Result Value Ref Range   Salicylate Lvl <7.0 (L) 7.0 - 30.0 mg/dL    Comment: Performed at [facility], [address] [place], [place] [address]  Acetaminophen  level     Status: Abnormal   Collection Time: 11/02/23  3:41 PM  Result Value Ref Range   Acetaminophen  (Tylenol ), Serum <10 (L) 10 - 30 ug/mL    Comment: (NOTE) Therapeutic concentrations vary significantly. A range of 10-30 ug/mL  may be an effective concentration for many patients. However, some  are best treated at concentrations outside of this range. Acetaminophen  concentrations >150 ug/mL at 4 hours after ingestion  and >50 ug/mL at 12 hours after ingestion are  often associated with  toxic reactions.  Performed at [facility], [address] [place], [place] [address]   cbc     Status: None   Collection Time: 11/02/23  3:41 PM  Result Value Ref Range   WBC 10.3 4.0 - 10.5 K/uL   RBC 5.27 4.22 - 5.81 MIL/uL   Hemoglobin 15.7 13.0 - 17.0 g/dL   HCT 55.0 60.9 - 47.9 %   MCV 85.2 80.0 - 100.0 fL   MCH 29.8 26.0 - 34.0 pg   MCHC 35.0 30.0 - 36.0 g/dL   RDW 87.3 88.4 - 84.4 %   Platelets 366 150 - 400 K/uL   nRBC 0.0 0.0 - 0.2 %    Comment: Performed at [facility], [address] [place], [place] [address]  Urine Drug Screen, Qualitative     Status: Abnormal   Collection Time: 11/02/23  3:42 PM  Result Value Ref Range   Tricyclic, Ur Screen POSITIVE (A) NONE DETECTED   Amphetamines, Ur Screen NONE DETECTED NONE DETECTED   MDMA (Ecstasy)Ur Screen NONE DETECTED NONE DETECTED   Cocaine Metabolite,Ur [place] NONE DETECTED NONE DETECTED   Opiate, Ur Screen NONE DETECTED NONE DETECTED   Phencyclidine (PCP) Ur S NONE DETECTED NONE DETECTED   Cannabinoid 50 Ng, Ur [place] NONE DETECTED NONE DETECTED   Barbiturates, Ur Screen NONE DETECTED NONE DETECTED   Benzodiazepine, Ur Scrn NONE DETECTED NONE DETECTED   Methadone Scn, Ur NONE DETECTED NONE DETECTED    Comment: (  NOTE) Tricyclics + metabolites, urine    Cutoff 1000 ng/mL Amphetamines + metabolites, urine  Cutoff 1000 ng/mL MDMA (Ecstasy), urine              Cutoff 500 ng/mL Cocaine Metabolite, urine          Cutoff 300 ng/mL Opiate + metabolites, urine        Cutoff 300 ng/mL Phencyclidine (PCP), urine         Cutoff 25 ng/mL Cannabinoid, urine                 Cutoff 50 ng/mL Barbiturates + metabolites, urine  Cutoff 200 ng/mL Benzodiazepine, urine              Cutoff 200 ng/mL Methadone, urine                   Cutoff 300 ng/mL  The urine drug screen provides only a preliminary, unconfirmed analytical test result and should not be used for non-medical purposes. Clinical  consideration and professional judgment should be applied to any positive drug screen result due to possible interfering substances. A more specific alternate chemical method must be used in order to obtain a confirmed analytical result. Gas chromatography / mass spectrometry (GC/MS) is the preferred confirm atory method. Performed at [facility], [address] [place], [place] [address]     Blood Alcohol level:  Lab Results  Component Value Date   [facility] <10 11/02/2023    Metabolic Disorder Labs: Lab Results  Component Value Date   HGBA1C 5.9 (H) 09/27/2023   MPG 122.63 09/27/2023   MPG 100 03/16/2009   No results found for: PROLACTIN Lab Results  Component Value Date   CHOL 200 09/26/2023   TRIG 118 09/26/2023   HDL 23 (L) 09/26/2023   CHOLHDL 8.7 09/26/2023   VLDL 24 09/26/2023   LDLCALC 153 (H) 09/26/2023   LDLCALC (H) 03/16/2009    113        Total Cholesterol/HDL:CHD Risk Coronary Heart Disease Risk Table                     Men   Women  1/2 Average Risk   3.4   3.3  Average Risk       5.0   4.4  2 X Average Risk   9.6   7.1  3 X Average Risk  23.4   11.0        Use the calculated Patient Ratio above and the CHD Risk Table to determine the patient's CHD Risk.        ATP III CLASSIFICATION (LDL):  <100     mg/dL   Optimal  899-870  mg/dL   Near or Above                    Optimal  130-159  mg/dL   Borderline  839-810  mg/dL   High  >809     mg/dL   Very High    Musculoskeletal: Strength & Muscle Tone: within normal limits Gait & Station: normal Patient leans: N/A  Psychiatric Specialty Exam: Physical Exam Vitals and nursing note reviewed.  Constitutional:      Appearance: Normal appearance.  HENT:     Head: Normocephalic.     Nose: Nose normal.  Pulmonary:     Effort: Pulmonary effort is normal.  Musculoskeletal:        General: Normal range of motion.     Cervical  back: Normal range of motion.  Neurological:     General: No  focal deficit present.     Mental Status: He is alert and oriented to person, place, and time.     Review of Systems  Psychiatric/Behavioral:  Positive for depression and suicidal ideas. The patient is nervous/anxious.   All other systems reviewed and are negative.   Blood pressure 133/89, pulse 65, temperature (!) 97.4 F (36.3 C), resp. rate 16, height 5' 6 (1.676 m), weight 88.5 kg, SpO2 95%.Body mass index is 31.47 kg/m.  General Appearance: Fairly Groomed  Eye Contact:  Good  Speech:  Normal Rate  Volume:  Normal  Mood:  Anxious  Affect:  Appropriate  Thought Process:  Coherent, Goal Directed, and Linear  Orientation:  Full (Time, Place, and Person)  Thought Content:  Obsessions  Suicidal Thoughts:  Yes.  without intent/plan  Homicidal Thoughts:  No  Memory:  Immediate;   Good Recent;   Good Remote;   Good  Judgement:  Fair  Insight:  Fair  Psychomotor Activity:  Normal  Concentration:  Concentration: Fair and Attention Span: Fair  Recall:  [employer] of Knowledge:  Fair  Language:  Fair  Akathisia:  No  Handed:  Right  AIMS (if indicated):     Assets:  Communication Skills Housing Social Support  ADL's:  Intact  Cognition:  WNL  Sleep:          Physical Exam: Physical Exam Vitals and nursing note reviewed.  Constitutional:      Appearance: Normal appearance.  HENT:     Head: Normocephalic.     Nose: Nose normal.  Pulmonary:     Effort: Pulmonary effort is normal.  Musculoskeletal:        General: Normal range of motion.     Cervical back: Normal range of motion.  Neurological:     General: No focal deficit present.     Mental Status: He is alert and oriented to person, place, and time.    Review of Systems  Psychiatric/Behavioral:  Positive for depression and suicidal ideas. The patient is nervous/anxious.   All other systems reviewed and are negative.  Blood pressure 133/89, pulse 65, temperature (!) 97.4 F (36.3 C), resp. rate 16, height  5' 6 (1.676 m), weight 88.5 kg, SpO2 95%. Body mass index is 31.47 kg/m.   Treatment Plan Summary: Daily contact with patient to assess and evaluate symptoms and progress in treatment, Medication management, and Plan : Major depressive disorder, recurrent, severe without psychosis: Prozac  60 mg decreased to 40 mg daily Started Paxil  10 mg to assist with sexual urges along with continuation of Risperdal  to augment the treatment resistant depression and intrusive thoughts  General anxiety d/o: Gabapentin  100 mg TID Hydroxyzine  25 mg TID PRN  Insomnia: Trazodone  100 mg daily at bedtime   Sharlot Becker, NP 11/04/2023, 9:41 AM

## 2023-11-04 NOTE — Plan of Care (Signed)
  Problem: Education: Goal: Knowledge of [facility] Education information/materials will improve Outcome: Not Progressing Goal: Emotional status will improve Outcome: Not Progressing Goal: Mental status will improve Outcome: Not Progressing Goal: Verbalization of understanding the information provided will improve Outcome: Not Progressing   Problem: Activity: Goal: Interest or engagement in activities will improve Outcome: Not Progressing Goal: Sleeping patterns will improve Outcome: Not Progressing   Problem: Coping: Goal: Ability to verbalize frustrations and anger appropriately will improve Outcome: Not Progressing Goal: Ability to demonstrate self-control will improve Outcome: Not Progressing   Problem: Health Behavior/Discharge Planning: Goal: Identification of resources available to assist in meeting health care needs will improve Outcome: Not Progressing Goal: Compliance with treatment plan for underlying cause of condition will improve Outcome: Not Progressing   Problem: Physical Regulation: Goal: Ability to maintain clinical measurements within normal limits will improve Outcome: Not Progressing   Problem: Safety: Goal: Periods of time without injury will increase Outcome: Not Progressing   Problem: Education: Goal: Will be free of psychotic symptoms Outcome: Not Progressing Goal: Knowledge of the prescribed therapeutic regimen will improve Outcome: Not Progressing   Problem: Coping: Goal: Coping ability will improve Outcome: Not Progressing   Problem: Health Behavior/Discharge Planning: Goal: Identification of resources available to assist in meeting health care needs will improve Outcome: Not Progressing   Problem: Self-Concept: Goal: Ability to disclose and discuss suicidal ideas will improve Outcome: Not Progressing   Problem: Coping: Goal: Coping ability will improve Outcome: Not Progressing Goal: Will verbalize feelings Outcome: Not  Progressing

## 2023-11-04 NOTE — Group Note (Signed)
 Date:  11/04/2023 Time:  8:14 PM  Group Topic/Focus:  Rediscovering Joy:   The focus of this group is to explore various ways to relieve stress in a positive manner.    Participation Level:  Minimal  Participation Quality:  Appropriate  Affect:  Appropriate  Cognitive:  Alert and Appropriate  Insight: Appropriate  Engagement in Group:  Improving  Modes of Intervention:  Limit-setting and Socialization  Additional Comments:     Leily Capek 11/04/2023, 8:14 PM

## 2023-11-04 NOTE — Progress Notes (Signed)
 Pt calm and pleasant during assessment denying AVH. Pt endorses passive SI and HI towards nobody in particular. Pt did stated he was feeling better than when he first got  here. Pt verbally contracts for safety with this clinical research associate. Pt observed interacting appropriately with staff and peers on the unit by this clinical research associate. Pt given education, support, and encouragement to be active in his treatment plan. Pt being monitored Q 15 minutes for safety per unit protocol, remains safe on the unit

## 2023-11-04 NOTE — Progress Notes (Signed)
   11/04/23 1000  Psych Admission Type (Psych Patients Only)  Admission Status Voluntary  Psychosocial Assessment  Patient Complaints Depression;Anxiety;Other (Comment) (suicidal/homicidal ideations; auiditory hallucinations)  Eye Contact Fair  Facial Expression Anxious;Worried (patient stated I'm used to taking me meds and I'm not taking them here like I take them at home.)  Affect Appropriate to circumstance  Speech Logical/coherent  Interaction Assertive  Motor Activity Slow  Appearance/Hygiene In scrubs  Behavior Characteristics Cooperative;Appropriate to situation  Mood Pleasant;Preoccupied  Aggressive Behavior  Effect No apparent injury  Thought Process  Coherency WDL  Content Preoccupation  Delusions None reported or observed  Perception Hallucinations  Hallucination Auditory (patient states not now, not yet.)  Judgment Impaired  Confusion None  Danger to Self  Current suicidal ideation? Passive (patient endorses both SI/HI and reports just in general, but not too high.)  Self-Injurious Behavior Some self-injurious ideation observed or expressed.  No lethal plan expressed   Agreement Not to Harm Self Yes  Description of Agreement Verbal  Danger to Others  Danger to Others None reported or observed  Danger to Others Abnormal  Harmful Behavior to others No threats or harm toward other people  Destructive Behavior No threats or harm toward property

## 2023-11-05 DIAGNOSIS — F332 Major depressive disorder, recurrent severe without psychotic features: Secondary | ICD-10-CM | POA: Diagnosis not present

## 2023-11-05 NOTE — Progress Notes (Signed)
 [facility] MD Progress Note  11/05/2023 9:38 AM KENGO STURGES  MRN:  [id number]  Subjective:  Progression team completed prior to assessment along with review of labs, vital signs, and notes. On rounds today, Myshawn reports feeling pretty good. He denies any feelings of anxiety or depression. He stated he slept well last night and he denies any issues with his appetite. Currently denies auditory and visual hallucinations along with suicidal and homicidal ideations. He expressed his medications are working well and denies any side effects. Client plans to follow up with his regular psychiatrist Dr. Chalice at [facility] after discharge on Friday.   Ravin continues to have intrusive thoughts at times with no impulse to act on these thoughts.  Principal Problem: Major depressive disorder, recurrent severe without psychotic features (HCC) Diagnosis: Principal Problem:   Major depressive disorder, recurrent severe without psychotic features (HCC) Active Problems:   Suicidal ideation   Homicidal ideations  Total Time spent with patient: 30 minutes  Past Psychiatric History: depression, intrusive thoughts, anxiety  Past Medical History:  Past Medical History:  Diagnosis Date   Anxiety    Asthma    Chronic bronchitis (HCC)    Panic attacks     Past Surgical History:  Procedure Laterality Date   TONSILLECTOMY     Family History:  Family History  Problem Relation Age of Onset   Anxiety disorder Mother    Depression Mother    Hypertension Mother    Diabetes Mother    Hypothyroidism Mother    Anxiety disorder Father    Hypertension Father    Hyperlipidemia Father    Lymphoma Maternal Grandfather    Family Psychiatric  History: see above Social History:  Social History   Substance and Sexual Activity  Alcohol Use Not Currently     Social History   Substance and Sexual Activity  Drug Use No    Social History   Socioeconomic History   Marital status: Legally Separated    Spouse  name: Not on file   Number of children: Not on file   Years of education: Not on file   Highest education level: Not on file  Occupational History   Not on file  Tobacco Use   Smoking status: Former    Current packs/day: 0.50    Types: Cigarettes   Smokeless tobacco: Never  Vaping Use   Vaping status: Never Used  Substance and Sexual Activity   Alcohol use: Not Currently   Drug use: No   Sexual activity: Not on file  Other Topics Concern   Not on file  Social History Narrative   Not on file   Social Drivers of Health   Financial Resource Strain: Not on file  Food Insecurity: Food Insecurity Present (11/03/2023)   Hunger Vital Sign    Worried About Running Out of Food in the Last Year: Sometimes true    Ran Out of Food in the Last Year: Sometimes true  Transportation Needs: No Transportation Needs (11/03/2023)   PRAPARE - Administrator, Civil Service (Medical): No    Lack of Transportation (Non-Medical): No  Physical Activity: Not on file  Stress: Not on file  Social Connections: Socially Isolated (10/07/2023)   Social Connection and Isolation Panel [NHANES]    Frequency of Communication with Friends and Family: More than three times a week    Frequency of Social Gatherings with Friends and Family: More than three times a week    Attends Religious Services: Never  Active Member of Clubs or Organizations: No    Attends Banker Meetings: Never    Marital Status: Separated   Additional Social History:       Sleep: Good  Appetite:  Good  Current Medications: Current Facility-Administered Medications  Medication Dose Route Frequency Provider Last Rate Last Admin   acetaminophen  (TYLENOL ) tablet 650 mg  650 mg Oral Q6H PRN Dixon, Rashaun M, NP       alum & mag hydroxide-simeth (MAALOX/MYLANTA) 200-200-20 MG/5ML suspension 30 mL  30 mL Oral Q4H PRN Dixon, Rashaun M, NP       FLUoxetine  (PROZAC ) capsule 40 mg  40 mg Oral Daily Lahna Nath Y,  NP   40 mg at 11/05/23 9183   gabapentin  (NEURONTIN ) capsule 100 mg  100 mg Oral TID Jacquetta Sharlot GRADE, NP   100 mg at 11/05/23 9183   hydrOXYzine  (ATARAX ) tablet 25 mg  25 mg Oral TID PRN Melvenia Madelene HERO, NP   25 mg at 11/04/23 0831   OLANZapine  zydis (ZYPREXA ) disintegrating tablet 5 mg  5 mg Oral Q8H PRN Melvenia Madelene HERO, NP       And   LORazepam  (ATIVAN ) tablet 1 mg  1 mg Oral PRN Melvenia Madelene HERO, NP       And   ziprasidone  (GEODON ) injection 20 mg  20 mg Intramuscular PRN Melvenia Madelene HERO, NP       losartan  (COZAAR ) tablet 100 mg  100 mg Oral Daily Jacquetta Sharlot GRADE, NP   100 mg at 11/05/23 9184   magnesium  hydroxide (MILK OF MAGNESIA) suspension 30 mL  30 mL Oral Daily PRN Melvenia Madelene HERO, NP       metoprolol  succinate (TOPROL -XL) 24 hr tablet 50 mg  50 mg Oral Daily Christropher Gintz Y, NP   50 mg at 11/05/23 9183   OLANZapine  (ZYPREXA ) injection 10 mg  10 mg Intramuscular TID PRN Melvenia Madelene HERO, NP       OLANZapine  (ZYPREXA ) injection 5 mg  5 mg Intramuscular TID PRN Melvenia Madelene HERO, NP       OLANZapine  zydis (ZYPREXA ) disintegrating tablet 5 mg  5 mg Oral TID PRN Melvenia Madelene HERO, NP       PARoxetine  (PAXIL ) tablet 10 mg  10 mg Oral Daily Kyal Arts Y, NP   10 mg at 11/05/23 9183   risperiDONE  (RISPERDAL ) tablet 2 mg  2 mg Oral QHS Nicholaus Brad RAMAN, NP   2 mg at 11/04/23 2102   traZODone  (DESYREL ) tablet 100 mg  100 mg Oral QHS Melvenia Madelene HERO, NP   100 mg at 11/04/23 2102    Lab Results:  No results found for this or any previous visit (from the past 48 hours).   Blood Alcohol level:  Lab Results  Component Value Date   ETH <10 11/02/2023    Metabolic Disorder Labs: Lab Results  Component Value Date   HGBA1C 5.9 (H) 09/27/2023   MPG 122.63 09/27/2023   MPG 100 03/16/2009   No results found for: PROLACTIN Lab Results  Component Value Date   CHOL 200 09/26/2023   TRIG 118 09/26/2023   HDL 23 (L) 09/26/2023   CHOLHDL 8.7 09/26/2023   VLDL 24 09/26/2023   LDLCALC  153 (H) 09/26/2023   LDLCALC (H) 03/16/2009    113        Total Cholesterol/HDL:CHD Risk Coronary Heart Disease Risk Table  Men   Women  1/2 Average Risk   3.4   3.3  Average Risk       5.0   4.4  2 X Average Risk   9.6   7.1  3 X Average Risk  23.4   11.0        Use the calculated Patient Ratio above and the CHD Risk Table to determine the patient's CHD Risk.        ATP III CLASSIFICATION (LDL):  <100     mg/dL   Optimal  899-870  mg/dL   Near or Above                    Optimal  130-159  mg/dL   Borderline  839-810  mg/dL   High  >809     mg/dL   Very High    Musculoskeletal: Strength & Muscle Tone: within normal limits Gait & Station: normal Patient leans: N/A  Psychiatric Specialty Exam: Physical Exam Vitals and nursing note reviewed.  Constitutional:      Appearance: Normal appearance.  HENT:     Head: Normocephalic.     Nose: Nose normal.  Pulmonary:     Effort: Pulmonary effort is normal.  Musculoskeletal:        General: Normal range of motion.     Cervical back: Normal range of motion.  Neurological:     General: No focal deficit present.     Mental Status: He is alert and oriented to person, place, and time.     Review of Systems  All other systems reviewed and are negative.   Blood pressure 122/74, pulse 77, temperature 97.8 F (36.6 C), resp. rate 16, height 5' 6 (1.676 m), weight 88.5 kg, SpO2 97%.Body mass index is 31.47 kg/m.  General Appearance: Fairly Groomed  Eye Contact:  Good  Speech:  Normal Rate  Volume:  Normal  Mood:  euthymia   Affect:  Appropriate  Thought Process:  Coherent, Goal Directed, and Linear  Orientation:  Full (Time, Place, and Person)  Thought Content:  Obsessions  Suicidal Thoughts:  No  Homicidal Thoughts:  No  Memory:  Immediate;   Good Recent;   Good Remote;   Good  Judgement:  Fair  Insight:  Fair  Psychomotor Activity:  Normal  Concentration:  Concentration: Fair and Attention Span:  Fair  Recall:  [employer] of Knowledge:  Fair  Language:  Fair  Akathisia:  No  Handed:  Right  AIMS (if indicated):     Assets:  Communication Skills Housing Social Support  ADL's:  Intact  Cognition:  WNL  Sleep:          Physical Exam: Physical Exam Vitals and nursing note reviewed.  Constitutional:      Appearance: Normal appearance.  HENT:     Head: Normocephalic.     Nose: Nose normal.  Pulmonary:     Effort: Pulmonary effort is normal.  Musculoskeletal:        General: Normal range of motion.     Cervical back: Normal range of motion.  Neurological:     General: No focal deficit present.     Mental Status: He is alert and oriented to person, place, and time.    Review of Systems  All other systems reviewed and are negative.  Blood pressure 122/74, pulse 77, temperature 97.8 F (36.6 C), resp. rate 16, height 5' 6 (1.676 m), weight 88.5 kg, SpO2 97%. Body mass index is  31.47 kg/m.   Treatment Plan Summary: Daily contact with patient to assess and evaluate symptoms and progress in treatment, Medication management, and Plan : Major depressive disorder, recurrent, severe without psychosis: Prozac  60 mg decreased to 40 mg daily Started Paxil  10 mg to assist with sexual urges along with continuation of Risperdal  to augment the treatment resistant depression and intrusive thoughts  General anxiety d/o: Gabapentin  100 mg TID Hydroxyzine  25 mg TID PRN  Insomnia: Trazodone  100 mg daily at bedtime   Sharlot Becker, NP 11/05/2023, 9:38 AM

## 2023-11-05 NOTE — Progress Notes (Signed)
 Pt calm and pleasant during assessment denying SI/HI/AVH. Pt observed interacting appropriately with staff and peers on the unit by this clinical research associate. Pt given education, support, and encouragement to be active in his treatment plan. Pt being monitored Q 15 minutes for safety per unit protocol, remains safe on the unit

## 2023-11-05 NOTE — Plan of Care (Signed)
 Pt compliant with procedures on the unit  Problem: Education: Goal: Knowledge of [facility] Education information/materials will improve Outcome: Progressing Goal: Emotional status will improve Outcome: Progressing Goal: Mental status will improve Outcome: Progressing Goal: Verbalization of understanding the information provided will improve Outcome: Progressing   Problem: Activity: Goal: Interest or engagement in activities will improve Outcome: Progressing Goal: Sleeping patterns will improve Outcome: Progressing   Problem: Coping: Goal: Ability to verbalize frustrations and anger appropriately will improve Outcome: Progressing Goal: Ability to demonstrate self-control will improve Outcome: Progressing   Problem: Health Behavior/Discharge Planning: Goal: Identification of resources available to assist in meeting health care needs will improve Outcome: Progressing Goal: Compliance with treatment plan for underlying cause of condition will improve Outcome: Progressing   Problem: Physical Regulation: Goal: Ability to maintain clinical measurements within normal limits will improve Outcome: Progressing   Problem: Safety: Goal: Periods of time without injury will increase Outcome: Progressing   Problem: Education: Goal: Will be free of psychotic symptoms Outcome: Progressing Goal: Knowledge of the prescribed therapeutic regimen will improve Outcome: Progressing   Problem: Coping: Goal: Coping ability will improve Outcome: Progressing   Problem: Health Behavior/Discharge Planning: Goal: Identification of resources available to assist in meeting health care needs will improve Outcome: Progressing   Problem: Self-Concept: Goal: Ability to disclose and discuss suicidal ideas will improve Outcome: Progressing Goal: Will verbalize positive feelings about self Outcome: Progressing Note: Patient is on track. Patient will maintain adherence    Problem:  Coping: Goal: Coping ability will improve Outcome: Progressing Goal: Will verbalize feelings Outcome: Progressing

## 2023-11-05 NOTE — Group Note (Signed)
 Date:  11/05/2023 Time:  2:28 PM  Group Topic/Focus:  Activity Group:  The focus of the group is to promote activity for the patients and to go outside to the courtyard for some fresh air and exercise as well.    Participation Level:  Did Not Attend   Camellia HERO Jenevie Casstevens 11/05/2023, 2:28 PM

## 2023-11-05 NOTE — Plan of Care (Signed)
 Patient stated that he is feeling better with his anxiety and depression today. Patient states his goal is  getting adequate resources when I leave. Patient denies SI,HI and AVH. Isolates to his room most of the shift. Appetite and energy level good. Support and encouragement given.

## 2023-11-05 NOTE — Group Note (Signed)
 Date:  11/05/2023 Time:  11:02 AM  Group Topic/Focus:  Emotional Education:   The focus of this group is to discuss what feelings/emotions are, and how they are experienced.    Participation Level:  Active  Participation Quality:  Appropriate  Affect:  Appropriate  Cognitive:  Appropriate  Insight: Appropriate  Engagement in Group:  Engaged  Modes of Intervention:  Activity  Additional Comments:    David Salinas 11/05/2023, 11:02 AM

## 2023-11-06 DIAGNOSIS — F332 Major depressive disorder, recurrent severe without psychotic features: Secondary | ICD-10-CM | POA: Diagnosis not present

## 2023-11-06 NOTE — Plan of Care (Signed)
 Pt denies SI/HI/AVH  Problem: Education: Goal: Knowledge of [facility] Education information/materials will improve Outcome: Progressing Goal: Emotional status will improve Outcome: Progressing Goal: Mental status will improve Outcome: Progressing Goal: Verbalization of understanding the information provided will improve Outcome: Progressing   Problem: Activity: Goal: Interest or engagement in activities will improve Outcome: Progressing Goal: Sleeping patterns will improve Outcome: Progressing   Problem: Coping: Goal: Ability to verbalize frustrations and anger appropriately will improve Outcome: Progressing Goal: Ability to demonstrate self-control will improve Outcome: Progressing   Problem: Health Behavior/Discharge Planning: Goal: Identification of resources available to assist in meeting health care needs will improve Outcome: Progressing Goal: Compliance with treatment plan for underlying cause of condition will improve Outcome: Progressing   Problem: Physical Regulation: Goal: Ability to maintain clinical measurements within normal limits will improve Outcome: Progressing   Problem: Safety: Goal: Periods of time without injury will increase Outcome: Progressing   Problem: Education: Goal: Will be free of psychotic symptoms Outcome: Progressing Goal: Knowledge of the prescribed therapeutic regimen will improve Outcome: Progressing   Problem: Coping: Goal: Coping ability will improve Outcome: Progressing   Problem: Health Behavior/Discharge Planning: Goal: Identification of resources available to assist in meeting health care needs will improve Outcome: Progressing   Problem: Self-Concept: Goal: Ability to disclose and discuss suicidal ideas will improve Outcome: Progressing Goal: Will verbalize positive feelings about self Outcome: Progressing Note: Patient is on track. Patient will maintain adherence    Problem: Coping: Goal: Coping  ability will improve Outcome: Progressing Goal: Will verbalize feelings Outcome: Progressing

## 2023-11-06 NOTE — Group Note (Signed)
 Recreation Therapy Group Note   Group Topic:Relaxation  Group Date: 11/06/2023 Start Time: 1000 End Time: 1050 Facilitators: Celestia Jeoffrey BRAVO, LRT, CTRS Location:  Craft Room  Group Description: PMR (Progressive Muscle Relaxation). LRT asks patients their current level of stress/anxiety from 1-10, with 10 being the highest. LRT educates patients on what PMR is and the benefits that come from it. Patients are asked to sit with their feet flat on the floor while sitting up and all the way back in their chair, if possible. LRT and pts follow a prompt through a speaker that requires you to tense and release different muscles in their body and focus on their breathing. During session, lights are off and soft music is being played. Pts are given a stress ball to use if needed. At the end of the prompt, LRT asks patients to rank their current levels of stress/anxiety from 1-10, 10 being the highest. LRT provides patients with an education handout on PMR.   Goal Area(s) Addressed:  Patients will be able to describe progressive muscle relaxation.  Patient will practice using relaxation technique. Patient will identify a new coping skill.  Patient will follow multistep directions to reduce anxiety and stress.   Affect/Mood: Appropriate   Participation Level: Active and Engaged   Participation Quality: Independent   Behavior: Appropriate, Calm, and Cooperative   Speech/Thought Process: Coherent   Insight: Good   Judgement: Good   Modes of Intervention: Education, Exploration, and Guided Discussion   Patient Response to Interventions:  Attentive, Engaged, Interested , and Requested additional information/resources    Education Outcome:  Acknowledges education   Clinical Observations/Individualized Feedback: David Salinas was active in their participation of session activities and group discussion. Pt identified that his anxiety and stress were both a 1 before and after the session. Pt shared that  he enjoys doing relaxation sessions like this. Pt requested and received a stress ball after session. Pt interacted well with LRT and peers duration of session.    Plan: Continue to engage patient in RT group sessions 2-3x/week.   Jeoffrey BRAVO Celestia, LRT, CTRS 11/06/2023 11:01 AM

## 2023-11-06 NOTE — Group Note (Signed)
 Date:  11/06/2023 Time:  9:41 PM  Group Topic/Focus:  Wellness Toolbox:   The focus of this group is to discuss various aspects of wellness, balancing those aspects and exploring ways to increase the ability to experience wellness.  Patients will create a wellness toolbox for use upon discharge.    Participation Level:  Active  Participation Quality:  Appropriate, Attentive, Sharing, and Supportive  Affect:  Appropriate  Cognitive:  Appropriate  Insight: Appropriate and Good  Engagement in Group:  Supportive  Modes of Intervention:  Discussion and Support  Additional Comments:     Kerri Katz 11/06/2023, 9:41 PM

## 2023-11-06 NOTE — Progress Notes (Signed)
 Recreation Therapy Notes  Bryton asked LRT if there were any online grief groups. LRT found a website (www.grielfshare.org) that provides support groups online and in person. LRT wrote down the name of the website and gave to pt.  Pt was receptive.   Amandine Covino LRT, CTRS Yurem Viner E Lachanda Buczek 11/06/2023 11:05 AM

## 2023-11-06 NOTE — Group Note (Signed)
 Recreation Therapy Group Note   Group Topic:Other  Group Date: 11/06/2023 Start Time: 1530 End Time: 1600 Facilitators: Celestia Jeoffrey BRAVO, LRT, CTRS Location: Courtyard  Group Description: [employer]. LRT and patients played games of basketball, drew with chalk, and played corn hole while outside in the courtyard while getting fresh air and sunlight. Music was being played in the background. LRT and peers conversed about different games they have played before, what they do in their free time and anything else that is on their minds. LRT encouraged pts to drink water after being outside, sweating and getting their heart rate up.  Goal Area(s) Addressed: Patient will build on frustration tolerance skills. Patients will partake in a competitive play game with peers. Patients will gain knowledge of new leisure interest/hobby.     Affect/Mood: N/A   Participation Level: Did not attend    Clinical Observations/Individualized Feedback: Patient did not attend group.   Plan: Continue to engage patient in RT group sessions 2-3x/week.   Jeoffrey BRAVO Celestia, LRT, CTRS 11/06/2023 5:38 PM

## 2023-11-06 NOTE — Progress Notes (Signed)
 [facility] MD Progress Note  11/06/2023 6:45 AM David Salinas  MRN:  [id number]  Subjective:  Progression team completed prior to assessment along with review of labs, vital signs, and notes. On rounds today, David Salinas reports feeling pretty good. He denies any feelings of anxiety or depression and stated he slept well last night. David Salinas denies any issues with his appetite. Currently denies auditory and visual hallucinations along with suicidal and homicidal ideations. He says he has been having some constipation which isn't new. Patient made aware of PRN MOM. Shondell stated his goal for today is to start making phone calls regarding his discharge tomorrow (Friday). When asked about his intrusive thoughts, he said, I don't have none.  Principal Problem: Major depressive disorder, recurrent severe without psychotic features (HCC) Diagnosis: Principal Problem:   Major depressive disorder, recurrent severe without psychotic features (HCC) Active Problems:   Suicidal ideation   Homicidal ideations  Total Time spent with patient: 30 minutes  Past Psychiatric History: depression, intrusive thoughts, anxiety  Past Medical History:  Past Medical History:  Diagnosis Date   Anxiety    Asthma    Chronic bronchitis (HCC)    Panic attacks     Past Surgical History:  Procedure Laterality Date   TONSILLECTOMY     Family History:  Family History  Problem Relation Age of Onset   Anxiety disorder Mother    Depression Mother    Hypertension Mother    Diabetes Mother    Hypothyroidism Mother    Anxiety disorder Father    Hypertension Father    Hyperlipidemia Father    Lymphoma Maternal Grandfather    Family Psychiatric  History: see above Social History:  Social History   Substance and Sexual Activity  Alcohol Use Not Currently     Social History   Substance and Sexual Activity  Drug Use No    Social History   Socioeconomic History   Marital status: Legally Separated    Spouse  name: Not on file   Number of children: Not on file   Years of education: Not on file   Highest education level: Not on file  Occupational History   Not on file  Tobacco Use   Smoking status: Former    Current packs/day: 0.50    Types: Cigarettes   Smokeless tobacco: Never  Vaping Use   Vaping status: Never Used  Substance and Sexual Activity   Alcohol use: Not Currently   Drug use: No   Sexual activity: Not on file  Other Topics Concern   Not on file  Social History Narrative   Not on file   Social Drivers of Health   Financial Resource Strain: Not on file  Food Insecurity: Food Insecurity Present (11/03/2023)   Hunger Vital Sign    Worried About Running Out of Food in the Last Year: Sometimes true    Ran Out of Food in the Last Year: Sometimes true  Transportation Needs: No Transportation Needs (11/03/2023)   PRAPARE - Administrator, Civil Service (Medical): No    Lack of Transportation (Non-Medical): No  Physical Activity: Not on file  Stress: Not on file  Social Connections: Socially Isolated (10/07/2023)   Social Connection and Isolation Panel [NHANES]    Frequency of Communication with Friends and Family: More than three times a week    Frequency of Social Gatherings with Friends and Family: More than three times a week    Attends Religious Services: Never    Active  Member of Clubs or Organizations: No    Attends Engineer, Structural: Never    Marital Status: Separated   Additional Social History:       Sleep: Good  Appetite:  Good  Current Medications: Current Facility-Administered Medications  Medication Dose Route Frequency Provider Last Rate Last Admin   acetaminophen  (TYLENOL ) tablet 650 mg  650 mg Oral Q6H PRN Dixon, Rashaun M, NP       alum & mag hydroxide-simeth (MAALOX/MYLANTA) 200-200-20 MG/5ML suspension 30 mL  30 mL Oral Q4H PRN Dixon, Rashaun M, NP       FLUoxetine  (PROZAC ) capsule 40 mg  40 mg Oral Daily Raney Antwine Y,  NP   40 mg at 11/05/23 9183   gabapentin  (NEURONTIN ) capsule 100 mg  100 mg Oral TID Jacquetta Sharlot GRADE, NP   100 mg at 11/05/23 1622   hydrOXYzine  (ATARAX ) tablet 25 mg  25 mg Oral TID PRN Melvenia Madelene HERO, NP   25 mg at 11/04/23 0831   OLANZapine  zydis (ZYPREXA ) disintegrating tablet 5 mg  5 mg Oral Q8H PRN Melvenia Madelene HERO, NP       And   LORazepam  (ATIVAN ) tablet 1 mg  1 mg Oral PRN Melvenia Madelene HERO, NP       And   ziprasidone  (GEODON ) injection 20 mg  20 mg Intramuscular PRN Melvenia Madelene HERO, NP       losartan  (COZAAR ) tablet 100 mg  100 mg Oral Daily Jacquetta Sharlot GRADE, NP   100 mg at 11/05/23 9184   magnesium  hydroxide (MILK OF MAGNESIA) suspension 30 mL  30 mL Oral Daily PRN Melvenia Madelene HERO, NP       metoprolol  succinate (TOPROL -XL) 24 hr tablet 50 mg  50 mg Oral Daily Dylanie Quesenberry Y, NP   50 mg at 11/05/23 9183   OLANZapine  (ZYPREXA ) injection 10 mg  10 mg Intramuscular TID PRN Melvenia Madelene HERO, NP       OLANZapine  (ZYPREXA ) injection 5 mg  5 mg Intramuscular TID PRN Melvenia Madelene HERO, NP       OLANZapine  zydis (ZYPREXA ) disintegrating tablet 5 mg  5 mg Oral TID PRN Melvenia Madelene HERO, NP       PARoxetine  (PAXIL ) tablet 10 mg  10 mg Oral Daily Abbegale Stehle Y, NP   10 mg at 11/05/23 9183   risperiDONE  (RISPERDAL ) tablet 2 mg  2 mg Oral QHS Davis, Nina S, NP   2 mg at 11/05/23 2058   traZODone  (DESYREL ) tablet 100 mg  100 mg Oral QHS Melvenia Madelene HERO, NP   100 mg at 11/05/23 2058    Lab Results:  No results found for this or any previous visit (from the past 48 hours).   Blood Alcohol level:  Lab Results  Component Value Date   ETH <10 11/02/2023    Metabolic Disorder Labs: Lab Results  Component Value Date   HGBA1C 5.9 (H) 09/27/2023   MPG 122.63 09/27/2023   MPG 100 03/16/2009   No results found for: PROLACTIN Lab Results  Component Value Date   CHOL 200 09/26/2023   TRIG 118 09/26/2023   HDL 23 (L) 09/26/2023   CHOLHDL 8.7 09/26/2023   VLDL 24 09/26/2023   LDLCALC  153 (H) 09/26/2023   LDLCALC (H) 03/16/2009    113        Total Cholesterol/HDL:CHD Risk Coronary Heart Disease Risk Table  Men   Women  1/2 Average Risk   3.4   3.3  Average Risk       5.0   4.4  2 X Average Risk   9.6   7.1  3 X Average Risk  23.4   11.0        Use the calculated Patient Ratio above and the CHD Risk Table to determine the patient's CHD Risk.        ATP III CLASSIFICATION (LDL):  <100     mg/dL   Optimal  899-870  mg/dL   Near or Above                    Optimal  130-159  mg/dL   Borderline  839-810  mg/dL   High  >809     mg/dL   Very High    Musculoskeletal: Strength & Muscle Tone: within normal limits Gait & Station: normal Patient leans: N/A  Psychiatric Specialty Exam: Physical Exam Vitals and nursing note reviewed.  Constitutional:      Appearance: Normal appearance.  HENT:     Head: Normocephalic.     Nose: Nose normal.  Pulmonary:     Effort: Pulmonary effort is normal.  Musculoskeletal:        General: Normal range of motion.     Cervical back: Normal range of motion.  Neurological:     General: No focal deficit present.     Mental Status: He is alert and oriented to person, place, and time.     Review of Systems  All other systems reviewed and are negative.   Blood pressure 99/62, pulse (!) 51, temperature 98.1 F (36.7 C), resp. rate 16, height 5' 6 (1.676 m), weight 88.5 kg, SpO2 98%.Body mass index is 31.47 kg/m.  General Appearance: Fairly Groomed  Eye Contact:  Good  Speech:  Normal Rate  Volume:  Normal  Mood:  euthymia   Affect:  Appropriate  Thought Process:  Coherent, Goal Directed, and Linear  Orientation:  Full (Time, Place, and Person)  Thought Content:  Obsessions  Suicidal Thoughts:  No  Homicidal Thoughts:  No  Memory:  Immediate;   Good Recent;   Good Remote;   Good  Judgement:  Fair  Insight:  Fair  Psychomotor Activity:  Normal  Concentration:  Concentration: Fair and Attention  Span: Fair  Recall:  [employer] of Knowledge:  Fair  Language:  Fair  Akathisia:  No  Handed:  Right  AIMS (if indicated):     Assets:  Communication Skills Housing Social Support  ADL's:  Intact  Cognition:  WNL  Sleep:          Physical Exam: Physical Exam Vitals and nursing note reviewed.  Constitutional:      Appearance: Normal appearance.  HENT:     Head: Normocephalic.     Nose: Nose normal.  Pulmonary:     Effort: Pulmonary effort is normal.  Musculoskeletal:        General: Normal range of motion.     Cervical back: Normal range of motion.  Neurological:     General: No focal deficit present.     Mental Status: He is alert and oriented to person, place, and time.    Review of Systems  All other systems reviewed and are negative.  Blood pressure 99/62, pulse (!) 51, temperature 98.1 F (36.7 C), resp. rate 16, height 5' 6 (1.676 m), weight 88.5 kg, SpO2 98%. Body mass  index is 31.47 kg/m.   Treatment Plan Summary: Daily contact with patient to assess and evaluate symptoms and progress in treatment, Medication management, and Plan : Major depressive disorder, recurrent, severe without psychosis: Prozac  40 mg daily Paxil  10 mg to assist with sexual urges along with continuation of Risperdal  to augment the treatment resistant depression and intrusive thoughts  General anxiety d/o: Gabapentin  100 mg TID Hydroxyzine  25 mg TID PRN  Insomnia: Trazodone  100 mg daily at bedtime   Sharlot Becker, NP 11/06/2023, 6:45 AM

## 2023-11-06 NOTE — Group Note (Signed)
 LCSW Group Therapy Note   Group Date: 11/06/2023 Start Time: 1300 End Time: 1343   Type of Therapy and Topic:  Group Therapy: Challenging Core Beliefs  Participation Level:  Active  Description of Group:  Patients were educated about core beliefs and asked to identify one harmful core belief that they have. Patients were asked to explore from where those beliefs originate. Patients were asked to discuss how those beliefs make them feel and the resulting behaviors of those beliefs. They were then be asked if those beliefs are true and, if so, what evidence they have to support them. Lastly, group members were challenged to replace those negative core beliefs with helpful beliefs.   Therapeutic Goals:   1. Patient will identify harmful core beliefs and explore the origins of such beliefs. 2. Patient will identify feelings and behaviors that result from those core beliefs. 3. Patient will discuss whether such beliefs are true. 4.  Patient will replace harmful core beliefs with helpful ones.  Summary of Patient Progress:  Patient actively engaged in processing and exploring how core beliefs are formed and how they impact thoughts, feelings, and behaviors. Patient proved open to input from peers and feedback from CSW. Patient demonstrated proficient insight into the subject matter, was respectful and supportive of peers, and participated throughout the entire session.  Therapeutic Modalities: Cognitive Behavioral Therapy; Solution-Focused Therapy   Seva Chancy M Gioia Ranes, LCSWA 11/06/2023  1:45 PM

## 2023-11-07 DIAGNOSIS — F332 Major depressive disorder, recurrent severe without psychotic features: Secondary | ICD-10-CM

## 2023-11-07 MED ORDER — HYDROXYZINE HCL 25 MG PO TABS: 25.0000 mg | ORAL_TABLET | Freq: Three times a day (TID) | ORAL | 0 refills | Status: AC | PRN

## 2023-11-07 MED ORDER — GABAPENTIN 100 MG PO CAPS: 100.0000 mg | ORAL_CAPSULE | Freq: Three times a day (TID) | ORAL | 0 refills | Status: AC

## 2023-11-07 MED ORDER — FLUOXETINE HCL 40 MG PO CAPS: 40.0000 mg | ORAL_CAPSULE | Freq: Every day | ORAL | 0 refills | Status: DC

## 2023-11-07 MED ORDER — PAROXETINE HCL 10 MG PO TABS: 10.0000 mg | ORAL_TABLET | Freq: Every day | ORAL | 0 refills | Status: DC

## 2023-11-07 MED ORDER — RISPERIDONE 2 MG PO TABS: 2.0000 mg | ORAL_TABLET | Freq: Every day | ORAL | 0 refills | Status: AC

## 2023-11-07 NOTE — Plan of Care (Signed)
   Problem: Education: Goal: Knowledge of [place] [facility] Education information/materials will improve Outcome: Progressing Goal: Emotional status will improve Outcome: Progressing Goal: Mental status will improve Outcome: Progressing

## 2023-11-07 NOTE — Progress Notes (Signed)
 Patient ID: David Salinas, male   DOB: 1991/02/06, 33 y.o.   MRN: [id number]  Discharge Note:  Patient denies SI/HI/AVH at this time. Discharge instructions, AVS, prescriptions, and transition record gone over with patient. Patient given a copy of his Suicide Safety Plan. Patient agrees to comply with medication management, follow-up visit, and outpatient therapy. Patient belongings returned to patient. Patient questions and concerns addressed and answered. Patient ambulatory off unit. Patient discharged to home with his Sister.

## 2023-11-07 NOTE — Progress Notes (Signed)
   11/06/23 2000  Psych Admission Type (Psych Patients Only)  Admission Status Voluntary  Psychosocial Assessment  Patient Complaints Anxiety  Eye Contact Fair  Facial Expression Sad  Affect Appropriate to circumstance  Speech Logical/coherent  Interaction Assertive  Motor Activity Slow  Appearance/Hygiene Unremarkable  Behavior Characteristics Cooperative  Mood Preoccupied  Aggressive Behavior  Effect No apparent injury  Thought Process  Coherency WDL  Content WDL  Delusions None reported or observed  Perception WDL  Hallucination None reported or observed  Judgment WDL  Danger to Self  Current suicidal ideation? Denies  Self-Injurious Behavior No self-injurious ideation or behavior indicators observed or expressed   Agreement Not to Harm Self Yes  Danger to Others  Danger to Others None reported or observed  Danger to Others Abnormal  Harmful Behavior to others No threats or harm toward other people

## 2023-11-07 NOTE — Group Note (Signed)
 Recreation Therapy Group Note   Group Topic:Leisure Education  Group Date: 11/07/2023 Start Time: 1000 End Time: 1100 Facilitators: Celestia Jeoffrey BRAVO, LRT, CTRS Location:  Craft Room  Group Description: Leisure. Patients were given the option to choose from singing karaoke, coloring mandalas, using oil pastels, journaling, or playing with play-doh. LRT and pts discussed the meaning of leisure, the importance of participating in leisure during their free time/when they're outside of the hospital, as well as how our leisure interests can also serve as coping skills.    Goal Area(s) Addressed:  Patient will identify a current leisure interest.  Patient will learn the definition of "leisure". Patient will practice making a positive decision. Patient will have the opportunity to try a new leisure activity. Patient will communicate with peers and LRT.    Affect/Mood: Appropriate   Participation Level: Active and Engaged   Participation Quality: Independent   Behavior: Appropriate, Calm, and Cooperative   Speech/Thought Process: Coherent   Insight: Good   Judgement: Good   Modes of Intervention: Education, Exploration, Music, Open Conversation, and Rapport Building   Patient Response to Interventions:  Attentive, Engaged, Interested , and Receptive   Education Outcome:  Acknowledges education   Clinical Observations/Individualized Feedback: David Salinas was active in their participation of session activities and group discussion. Pt identified write to my pen pals in prison and listen to music as things he does in his free time. Pt chose to listen to music while in group. Pt shared that he was looking forward to going home and that he is feeling much better. Pt interacted well with LRT and peers duration of session.    Plan: Continue to engage patient in RT group sessions 2-3x/week.   Jeoffrey BRAVO Celestia, LRT, CTRS 11/07/2023 1:25 PM

## 2023-11-07 NOTE — Discharge Summary (Addendum)
 Physician Discharge Summary Note  Patient:  David Salinas is an 33 y.o., male MRN:  [id number] DOB:  [birth year] Patient phone:  [phone] (home)  Patient address:   [address] [place] [address],  Total Time spent with patient: 45 minutes  Date of Admission:  11/03/2023 Date of Discharge: 11/07/2023  Reason for Admission:  intrusive suicidal and homicidal thoughts  Principal Problem: Major depressive disorder, recurrent severe without psychotic features (HCC) Discharge Diagnoses: Principal Problem:   Major depressive disorder, recurrent severe without psychotic features (HCC) Active Problems:   Suicidal ideation   Homicidal ideations   Past Psychiatric History: OCD, panic attacks, anxiety, depression  Past Medical History:  Past Medical History:  Diagnosis Date   Anxiety    Asthma    Chronic bronchitis (HCC)    Panic attacks     Past Surgical History:  Procedure Laterality Date   TONSILLECTOMY     Family History:  Family History  Problem Relation Age of Onset   Anxiety disorder Mother    Depression Mother    Hypertension Mother    Diabetes Mother    Hypothyroidism Mother    Anxiety disorder Father    Hypertension Father    Hyperlipidemia Father    Lymphoma Maternal Grandfather    Family Psychiatric  History: see above Social History:  Social History   Substance and Sexual Activity  Alcohol Use Not Currently     Social History   Substance and Sexual Activity  Drug Use No    Social History   Socioeconomic History   Marital status: Legally Separated    Spouse name: Not on file   Number of children: Not on file   Years of education: Not on file   Highest education level: Not on file  Occupational History   Not on file  Tobacco Use   Smoking status: Former    Current packs/day: 0.50    Types: Cigarettes   Smokeless tobacco: Never  Vaping Use   Vaping status: Never Used  Substance and Sexual Activity   Alcohol use: Not  Currently   Drug use: No   Sexual activity: Not on file  Other Topics Concern   Not on file  Social History Narrative   Not on file   Social Drivers of Health   Financial Resource Strain: Not on file  Food Insecurity: Food Insecurity Present (11/03/2023)   Hunger Vital Sign    Worried About Running Out of Food in the Last Year: Sometimes true    Ran Out of Food in the Last Year: Sometimes true  Transportation Needs: No Transportation Needs (11/03/2023)   PRAPARE - Administrator, Civil Service (Medical): No    Lack of Transportation (Non-Medical): No  Physical Activity: Not on file  Stress: Not on file  Social Connections: Socially Isolated (10/07/2023)   Social Connection and Isolation Panel [NHANES]    Frequency of Communication with Friends and Family: More than three times a week    Frequency of Social Gatherings with Friends and Family: More than three times a week    Attends Religious Services: Never    Database Administrator or Organizations: No    Attends Banker Meetings: Never    Marital Status: Separated    Hospital Course:   33 yo male admitted with intrusive thoughts of self harm and harm to others.  While inpatient, medications started and adjusted along with therapy.  He stabilized quickly with his intrusive thoughts  resolving. Denies depression and anxiety, voiced he is ready to go home.  Reign attended groups and interacted in the day room without issues.  He will return to  live at home with father.  Denies suicidal/homicidal ideations, hallucinations, and substance abuse.  Discharge instructions provided along with explanations and crisis numbers, Rx, and follow up appointment information.  David Salinas will continue his care with his providers at [facility].  Musculoskeletal: Strength & Muscle Tone: within normal limits Gait & Station: normal Patient leans: N/A  Psychiatric Specialty Exam: Physical Exam Vitals and nursing note reviewed.   Constitutional:      Appearance: Normal appearance.  HENT:     Head: Normocephalic.     Nose: Nose normal.  Musculoskeletal:        General: Normal range of motion.     Cervical back: Normal range of motion.  Neurological:     General: No focal deficit present.     Mental Status: He is alert and oriented to person, place, and time.     Review of Systems  All other systems reviewed and are negative.   Blood pressure 113/68, pulse 78, temperature 98.2 F (36.8 C), resp. rate 20, height 5' 6 (1.676 m), weight 88.5 kg, SpO2 94%.Body mass index is 31.47 kg/m.  General Appearance: Casual  Eye Contact:  Good  Speech:  Normal Rate  Volume:  Normal  Mood:  Euthymic  Affect:  Congruent  Thought Process:  Coherent  Orientation:  Full (Time, Place, and Person)  Thought Content:  Logical  Suicidal Thoughts:  No  Homicidal Thoughts:  No  Memory:  Immediate;   Good Recent;   Good Remote;   Good  Judgement:  Good  Insight:  Good  Psychomotor Activity:  Normal  Concentration:  Concentration: Good and Attention Span: Good  Recall:  Good  Fund of Knowledge:  Fair  Language:  Good  Akathisia:  No  Handed:  Right  AIMS (if indicated):     Assets:  Housing Leisure Time Physical Health Resilience Social Support  ADL's:  Intact  Cognition:  WNL  Sleep:         Physical Exam: Physical Exam Vitals and nursing note reviewed.  Constitutional:      Appearance: Normal appearance.  HENT:     Head: Normocephalic.     Nose: Nose normal.  Musculoskeletal:        General: Normal range of motion.     Cervical back: Normal range of motion.  Neurological:     General: No focal deficit present.     Mental Status: He is alert and oriented to person, place, and time.    Review of Systems  All other systems reviewed and are negative.  Blood pressure 113/68, pulse 78, temperature 98.2 F (36.8 C), resp. rate 20, height 5' 6 (1.676 m), weight 88.5 kg, SpO2 94%. Body mass index is  31.47 kg/m.   Social History   Tobacco Use  Smoking Status Former   Current packs/day: 0.50   Types: Cigarettes  Smokeless Tobacco Never   Tobacco Cessation:  A prescription for an FDA-approved tobacco cessation medication was offered at discharge and the patient refused   Blood Alcohol level:  Lab Results  Component Value Date   [facility] <10 11/02/2023    Metabolic Disorder Labs:  Lab Results  Component Value Date   HGBA1C 5.9 (H) 09/27/2023   MPG 122.63 09/27/2023   MPG 100 03/16/2009   No results found for: PROLACTIN Lab Results  Component Value Date   CHOL 200 09/26/2023   TRIG 118 09/26/2023   HDL 23 (L) 09/26/2023   CHOLHDL 8.7 09/26/2023   VLDL 24 09/26/2023   LDLCALC 153 (H) 09/26/2023   LDLCALC (H) 03/16/2009    113        Total Cholesterol/HDL:CHD Risk Coronary Heart Disease Risk Table                     Men   Women  1/2 Average Risk   3.4   3.3  Average Risk       5.0   4.4  2 X Average Risk   9.6   7.1  3 X Average Risk  23.4   11.0        Use the calculated Patient Ratio above and the CHD Risk Table to determine the patient's CHD Risk.        ATP III CLASSIFICATION (LDL):  <100     mg/dL   Optimal  899-870  mg/dL   Near or Above                    Optimal  130-159  mg/dL   Borderline  839-810  mg/dL   High  >809     mg/dL   Very High    See Psychiatric Specialty Exam and Suicide Risk Assessment completed by Attending Physician prior to discharge.  Discharge destination:  Home  Is patient on multiple antipsychotic therapies at discharge:  No   Has Patient had three or more failed trials of antipsychotic monotherapy by history:  No  Recommended Plan for Multiple Antipsychotic Therapies: NA  Discharge Instructions     Diet - low sodium heart healthy   Complete by: As directed    Discharge instructions   Complete by: As directed    Follow up with outpatient appointment   Increase activity slowly   Complete by: As directed        Allergies as of 11/07/2023       Reactions   Codeine    Heart racing      Penicillins    Anaphylaxis         Medication List     STOP taking these medications    doxepin  25 MG capsule Commonly known as: SINEQUAN    risperiDONE  microspheres 50 MG injection Commonly known as: RISPERDAL  CONSTA       TAKE these medications      Indication  albuterol  108 (90 Base) MCG/ACT inhaler Commonly known as: VENTOLIN  HFA Inhale 1-2 puffs into the lungs every 6 (six) hours as needed for wheezing or shortness of breath.  Indication: Asthma   atorvastatin  10 MG tablet Commonly known as: LIPITOR Take 1 tablet (10 mg total) by mouth daily.  Indication: High Amount of Fats in the Blood   FLUoxetine  40 MG capsule Commonly known as: PROZAC  Take 1 capsule (40 mg total) by mouth daily. Start taking on: November 08, 2023 What changed:  medication strength how much to take  Indication: Depression   gabapentin  100 MG capsule Commonly known as: NEURONTIN  Take 1 capsule (100 mg total) by mouth 3 (three) times daily. What changed: how much to take  Indication: anxiety   hydrOXYzine  25 MG tablet Commonly known as: ATARAX  Take 1 tablet (25 mg total) by mouth 3 (three) times daily as needed for anxiety. What changed: how much to take  Indication: Feeling Anxious   losartan  100 MG tablet Commonly known as:  COZAAR  Take 1 tablet (100 mg total) by mouth daily after breakfast.  Indication: High Blood Pressure   metoprolol  succinate 50 MG 24 hr tablet Commonly known as: TOPROL -XL Take 1 tablet (50 mg total) by mouth daily.  Indication: High Blood Pressure   PARoxetine  10 MG tablet Commonly known as: PAXIL  Take 1 tablet (10 mg total) by mouth daily. Start taking on: November 08, 2023  Indication: Obsessive Compulsive Disorder   risperiDONE  2 MG tablet Commonly known as: RISPERDAL  Take 1 tablet (2 mg total) by mouth at bedtime. What changed: how much to take  Indication: Obsessive  Compulsive Disorder   traZODone  100 MG tablet Commonly known as: DESYREL  Take 1 tablet (100 mg total) by mouth at bedtime as needed for sleep.  Indication: Trouble Sleeping, Migraine Headache        Follow-up Information     [facility], [facility]. Go to.   Why: In person appointment made for 11/16/22 at 10 AM. Contact information: [address] [place] [address] [phone]                 Follow-up recommendations:  Activity:  as tolerated Diet:  heart healthy diet Major depressive disorder, recurrent, severe without psychosis: Prozac  40 mg daily Paxil  10 mg to assist with sexual urges along with continuation of Risperdal  to augment the treatment resistant depression and intrusive thoughts   General anxiety d/o: Gabapentin  100 mg TID Hydroxyzine  25 mg TID PRN   Insomnia: Trazodone  100 mg daily at bedtime  Comments:  follow up with [facility]  Signed: Sharlot Becker, NP 11/07/2023, 10:45 AM

## 2023-11-07 NOTE — [facility] Suicide Risk Assessment (Addendum)
 [facility] Discharge Suicide Risk Assessment   Principal Problem: Major depressive disorder, recurrent severe without psychotic features (HCC) Discharge Diagnoses: Principal Problem:   Major depressive disorder, recurrent severe without psychotic features (HCC) Active Problems:   Suicidal ideation   Homicidal ideations   Total Time spent with patient: 45 minutes   Musculoskeletal: Strength & Muscle Tone: within normal limits Gait & Station: normal Patient leans: N/A  Psychiatric Specialty Exam: Physical Exam Vitals and nursing note reviewed.  Constitutional:      Appearance: Normal appearance.  HENT:     Head: Normocephalic.     Nose: Nose normal.  Musculoskeletal:        General: Normal range of motion.     Cervical back: Normal range of motion.  Neurological:     General: No focal deficit present.     Mental Status: He is alert and oriented to person, place, and time.     Review of Systems  All other systems reviewed and are negative.   Blood pressure 113/68, pulse 78, temperature 98.2 F (36.8 C), resp. rate 20, height 5' 6 (1.676 m), weight 88.5 kg, SpO2 94%.Body mass index is 31.47 kg/m.  General Appearance: Casual  Eye Contact:  Good  Speech:  Normal Rate  Volume:  Normal  Mood:  Euthymic  Affect:  Congruent  Thought Process:  Coherent  Orientation:  Full (Time, Place, and Person)  Thought Content:  Logical  Suicidal Thoughts:  No  Homicidal Thoughts:  No  Memory:  Immediate;   Good Recent;   Good Remote;   Good  Judgement:  Good  Insight:  Good  Psychomotor Activity:  Normal  Concentration:  Concentration: Good and Attention Span: Good  Recall:  Good  Fund of Knowledge:  Fair  Language:  Good  Akathisia:  No  Handed:  Right  AIMS (if indicated):     Assets:  Housing Leisure Time Physical Health Resilience Social Support  ADL's:  Intact  Cognition:  WNL  Sleep:      Physical Exam: Physical Exam Vitals and nursing note reviewed.   Constitutional:      Appearance: Normal appearance.  HENT:     Head: Normocephalic.     Nose: Nose normal.  Pulmonary:     Effort: Pulmonary effort is normal.  Musculoskeletal:        General: Normal range of motion.     Cervical back: Normal range of motion.  Neurological:     General: No focal deficit present.     Mental Status: He is alert and oriented to person, place, and time.    Review of Systems  All other systems reviewed and are negative.  Blood pressure 113/68, pulse 78, temperature 98.2 F (36.8 C), resp. rate 20, height 5' 6 (1.676 m), weight 88.5 kg, SpO2 94%. Body mass index is 31.47 kg/m.  Mental Status Per Nursing Assessment::   On Admission:  Suicidal ideation indicated by patient  Demographic Factors:  Male and Caucasian  Loss Factors: NA  Historical Factors: NA  Risk Reduction Factors:   Sense of responsibility to family, Living with another person, especially a relative, Positive social support, Positive therapeutic relationship, and Positive coping skills or problem solving skills  Continued Clinical Symptoms:  None  Cognitive Features That Contribute To Risk:  None    Suicide Risk:  Minimal: No identifiable suicidal ideation.  Patients presenting with no risk factors but with morbid ruminations; may be classified as minimal risk based on the severity of the  depressive symptoms   Follow-up Information     [facility], [facility]. Go to.   Why: In person appointment made for 11/16/22 at 10 AM. Contact information: [address] [place] [address] [phone]                 Plan Of Care/Follow-up recommendations:  Activity:  as tolerated Diet:  heart healthy diet Major depressive disorder, recurrent, severe without psychosis: Prozac  40 mg daily Paxil  10 mg to assist with sexual urges along with continuation of Risperdal  to augment the treatment resistant depression and intrusive thoughts   General anxiety  d/o: Gabapentin  100 mg TID Hydroxyzine  25 mg TID PRN   Insomnia: Trazodone  100 mg daily at bedtime  Sharlot Becker, NP 11/07/2023, 10:52 AM

## 2023-11-07 NOTE — Group Note (Signed)
 Date:  11/07/2023 Time:  10:12 AM  Group Topic/Focus:  Goals Group:   The focus of this group is to help patients establish daily goals to achieve during treatment and discuss how the patient can incorporate goal setting into their daily lives to aide in recovery.  Participation Level:  Did Not Attend  Tuana Hoheisel A Keyron Pokorski 11/07/2023, 10:12 AM

## 2023-11-07 NOTE — Progress Notes (Signed)
   11/07/23 0900  Psych Admission Type (Psych Patients Only)  Admission Status Voluntary  Psychosocial Assessment  Patient Complaints None  Eye Contact Fair  Facial Expression Flat  Affect Appropriate to circumstance  Speech Logical/coherent  Interaction Assertive  Motor Activity Slow  Appearance/Hygiene In scrubs  Behavior Characteristics Cooperative;Appropriate to situation  Mood Pleasant  Aggressive Behavior  Effect No apparent injury  Thought Process  Coherency WDL  Content WDL  Delusions None reported or observed  Perception WDL  Hallucination None reported or observed  Judgment WDL  Confusion None  Danger to Self  Current suicidal ideation? Denies  Self-Injurious Behavior No self-injurious ideation or behavior indicators observed or expressed   Agreement Not to Harm Self Yes  Description of Agreement Verbal  Danger to Others  Danger to Others None reported or observed  Danger to Others Abnormal  Harmful Behavior to others No threats or harm toward other people  Destructive Behavior No threats or harm toward property

## 2023-11-07 NOTE — Plan of Care (Signed)
  Problem: Education: Goal: Knowledge of [facility] Education information/materials will improve Outcome: Adequate for Discharge Goal: Emotional status will improve Outcome: Adequate for Discharge Goal: Mental status will improve Outcome: Adequate for Discharge Goal: Verbalization of understanding the information provided will improve Outcome: Adequate for Discharge   Problem: Activity: Goal: Interest or engagement in activities will improve Outcome: Adequate for Discharge Goal: Sleeping patterns will improve Outcome: Adequate for Discharge   Problem: Coping: Goal: Ability to verbalize frustrations and anger appropriately will improve Outcome: Adequate for Discharge Goal: Ability to demonstrate self-control will improve Outcome: Adequate for Discharge   Problem: Health Behavior/Discharge Planning: Goal: Identification of resources available to assist in meeting health care needs will improve Outcome: Adequate for Discharge Goal: Compliance with treatment plan for underlying cause of condition will improve Outcome: Adequate for Discharge   Problem: Physical Regulation: Goal: Ability to maintain clinical measurements within normal limits will improve Outcome: Adequate for Discharge   Problem: Safety: Goal: Periods of time without injury will increase Outcome: Adequate for Discharge   Problem: Education: Goal: Will be free of psychotic symptoms Outcome: Adequate for Discharge Goal: Knowledge of the prescribed therapeutic regimen will improve Outcome: Adequate for Discharge   Problem: Coping: Goal: Coping ability will improve Outcome: Adequate for Discharge   Problem: Health Behavior/Discharge Planning: Goal: Identification of resources available to assist in meeting health care needs will improve Outcome: Adequate for Discharge   Problem: Self-Concept: Goal: Ability to disclose and discuss suicidal ideas will improve Outcome: Adequate for Discharge Goal:  Will verbalize positive feelings about self Outcome: Adequate for Discharge   Problem: Coping: Goal: Coping ability will improve Outcome: Adequate for Discharge Goal: Will verbalize feelings Outcome: Adequate for Discharge

## 2023-11-07 NOTE — Progress Notes (Signed)
  [facility] Adult Case Management Discharge Plan :  Will you be returning to the same living situation after discharge:  Yes,  Patient to return with his father.  At discharge, do you have transportation home?: Yes,  Patient's sister to provide transportation. Do you have the ability to pay for your medications: Yes, [facility]   Release of information consent forms completed and in the chart;  Patient's signature needed at discharge.  Patient to Follow up at:  Follow-up Information     [facility], [facility]. Go to.   Why: In person appointment made for 11/16/22 at 10 AM. Contact information: [address] [place] [address] [phone]                 Next level of care provider has access to [facility]:no  Safety Planning and Suicide Prevention discussed: No.CSW unable to make contact with patient's sister. SPE completed with pt, SPI pamphlet provided to pt and pt was encouraged to share information with support network, ask questions, and talk about any concerns relating to SPE. Pt denies access to guns/firearms and verbalized understanding of information provided. Mobile Crisis information also provided to pt.    SPE material given at discharge.      Has patient been referred to the Quitline?: Patient does not use tobacco/nicotine  products  Patient has been referred for addiction treatment: No known substance use disorder.  Alveta CHRISTELLA Kerns, LCSW 11/07/2023, 9:13 AM

## 2023-12-09 ENCOUNTER — Telehealth: Payer: Self-pay | Admitting: Family Medicine

## 2023-12-09 NOTE — Telephone Encounter (Signed)
Pt has had herpes for years and he wanted to know if we can give him an appointment to treat him.

## 2023-12-11 ENCOUNTER — Ambulatory Visit: Payer: Self-pay

## 2024-01-23 ENCOUNTER — Ambulatory Visit: Payer: MEDICAID | Admitting: Family Medicine

## 2024-01-30 ENCOUNTER — Encounter: Payer: Self-pay | Admitting: Psychiatric/Mental Health

## 2024-01-30 ENCOUNTER — Emergency Department
Admission: EM | Admit: 2024-01-30 | Discharge: 2024-01-31 | Disposition: A | Discharge status: Other Acute Inpatient Hospital | Payer: MEDICAID | Attending: Emergency Medicine | Admitting: Emergency Medicine

## 2024-01-30 ENCOUNTER — Other Ambulatory Visit: Payer: Self-pay

## 2024-01-30 DIAGNOSIS — Z8659 Personal history of other mental and behavioral disorders: Secondary | ICD-10-CM | POA: Diagnosis not present

## 2024-01-30 DIAGNOSIS — J45909 Unspecified asthma, uncomplicated: Secondary | ICD-10-CM | POA: Diagnosis not present

## 2024-01-30 DIAGNOSIS — I10 Essential (primary) hypertension: Secondary | ICD-10-CM | POA: Insufficient documentation

## 2024-01-30 DIAGNOSIS — R45850 Homicidal ideations: Secondary | ICD-10-CM

## 2024-01-30 DIAGNOSIS — F39 Unspecified mood [affective] disorder: Secondary | ICD-10-CM | POA: Diagnosis present

## 2024-01-30 DIAGNOSIS — Z87891 Personal history of nicotine dependence: Secondary | ICD-10-CM | POA: Insufficient documentation

## 2024-01-30 LAB — URINE DRUG SCREEN, QUALITATIVE (ARMC ONLY)
Amphetamines, Ur Screen: NOT DETECTED
Barbiturates, Ur Screen: NOT DETECTED
Benzodiazepine, Ur Scrn: NOT DETECTED
Cannabinoid 50 Ng, Ur ~~LOC~~: NOT DETECTED
Cocaine Metabolite,Ur ~~LOC~~: NOT DETECTED
MDMA (Ecstasy)Ur Screen: NOT DETECTED
Methadone Scn, Ur: NOT DETECTED
Opiate, Ur Screen: NOT DETECTED
Phencyclidine (PCP) Ur S: NOT DETECTED
Tricyclic, Ur Screen: NOT DETECTED

## 2024-01-30 LAB — CBC
HCT: 43.3 % (ref 39.0–52.0)
Hemoglobin: 15.1 g/dL (ref 13.0–17.0)
MCH: 29.6 pg (ref 26.0–34.0)
MCHC: 34.9 g/dL (ref 30.0–36.0)
MCV: 84.9 fL (ref 80.0–100.0)
Platelets: 371 10*3/uL (ref 150–400)
RBC: 5.1 MIL/uL (ref 4.22–5.81)
RDW: 12.5 % (ref 11.5–15.5)
WBC: 14.9 10*3/uL — ABNORMAL HIGH (ref 4.0–10.5)
nRBC: 0 % (ref 0.0–0.2)

## 2024-01-30 LAB — COMPREHENSIVE METABOLIC PANEL WITH GFR
ALT: 30 U/L (ref 0–44)
AST: 18 U/L (ref 15–41)
Albumin: 4.6 g/dL (ref 3.5–5.0)
Alkaline Phosphatase: 51 U/L (ref 38–126)
Anion gap: 9 (ref 5–15)
BUN: 17 mg/dL (ref 6–20)
CO2: 23 mmol/L (ref 22–32)
Calcium: 9.5 mg/dL (ref 8.9–10.3)
Chloride: 102 mmol/L (ref 98–111)
Creatinine, Ser: 1.4 mg/dL — ABNORMAL HIGH (ref 0.61–1.24)
GFR, Estimated: >60 mL/min (ref >60)
Glucose, Bld: 126 mg/dL — ABNORMAL HIGH (ref 70–99)
Potassium: 3.6 mmol/L (ref 3.5–5.1)
Sodium: 134 mmol/L — ABNORMAL LOW (ref 135–145)
Total Bilirubin: 0.6 mg/dL (ref 0.0–1.2)
Total Protein: 7.8 g/dL (ref 6.5–8.1)

## 2024-01-30 LAB — SALICYLATE LEVEL: Salicylate Lvl: <7 mg/dL — ABNORMAL LOW (ref 7.0–30.0)

## 2024-01-30 LAB — ACETAMINOPHEN LEVEL: Acetaminophen (Tylenol), Serum: <10 ug/mL — ABNORMAL LOW (ref 10–30)

## 2024-01-30 LAB — ETHANOL: Alcohol, Ethyl (B): <10 mg/dL (ref <10)

## 2024-01-30 MED ORDER — TRAZODONE HCL 100 MG PO TABS
100.0000 mg | ORAL_TABLET | Freq: Every day | ORAL | Status: DC
  Administered 2024-01-30: 100 mg via ORAL
  Filled 2024-01-30: qty 1

## 2024-01-30 NOTE — ED Notes (Signed)
 Patient belongings: Pair black shoes Blue short sleeve tee shirt Grey shorts Blue underware

## 2024-01-30 NOTE — BH Assessment (Signed)
 Comprehensive Clinical Assessment (CCA) Screening, Triage and Referral Note  01/30/2024 David Salinas [id number] Recommendations for Services/Supports/Treatments: Disposition pending David Salinas is a 33 y.o., Caucasian, Non-Hispanic or Latino ethnicity, ENGLISH speaking male who presented ED under IVC for an evaluation. Per triage note Arrives with [facility] under IVC paperwork. Patient lives with grandparents and per IVC paperwork, patient with escalating pedophilia, necrophilia, urges of rape.  On assessment, the Pt was cooperative and forthcoming about having racing, and intrusive thoughts that are sexual, violent, and rape themed. Pt reported that the thoughts involve both adults and children. Pt reported that the thoughts are becoming increasingly intrusive, explaining that sometimes he's only able to get rid of the thoughts by relieving himself at times. Pt reported that while his thoughts are intrusive, they do not cause him distress. Pt explained that he does not believe or have any intention of acting out the thoughts; however, his therapist thought it would be in the publics best interest if he was removed for a while. The pt reported that he is currently under investigation for a child pornography case. Pt explained that he is not on the registry yet. Pt denied having any other mental health concerns or issues with his mood. Pt denied having anxiety. Pt denied having any sleep or appetite disturbance. Pt had good insight and adequate reality testing. Pt presented with a euthymic mood; affect was congruent. Pt's BAL/UDS were unremarkable. Pt denied current SI/HI/AV/H. Chief Complaint: No chief complaint on file.  Visit Diagnosis: OCD  Patient Reported Information How did you hear about [place]? Self  What Is the Reason for Your Visit/Call Today? Patient reports suicidal and homicidal ideations.  How Long Has This Been Causing You Problems? > than 6 months  What Do You  Feel Would Help You the Most Today? Medication(s); Treatment for Depression or other mood problem   Have You Recently Had Any Thoughts About Hurting Yourself? Yes  Are You Planning to Commit Suicide/Harm Yourself At This time? Yes   Have you Recently Had Thoughts About Hurting Someone David Salinas? Yes  Are You Planning to Harm Someone at This Time? No  Explanation: No data recorded  Have You Used Any Alcohol or Drugs in the Past 24 Hours? No  How Long Ago Did You Use Drugs or Alcohol? No data recorded What Did You Use and How Much? No data recorded  Do You Currently Have a Therapist/Psychiatrist? Yes  Name of Therapist/Psychiatrist: [facility]- Dr. Flavia Shipper   Have You Been Recently Discharged From Any Office Practice or Programs? Yes  Explanation of Discharge From Practice/Program: [facility]- December 2024    CCA Screening Triage Referral Assessment Type of Contact: Face-to-Face  Telemedicine Service Delivery:   Is this Initial or Reassessment?   Date Telepsych consult ordered in [facility]:    Time Telepsych consult ordered in [facility]:    Location of Assessment: [facility] ED  Provider Location: [facility] ED    Collateral Involvement: None provided   Does Patient Have a Court Appointed Legal Guardian? No data recorded Name and Contact of Legal Guardian: No data recorded If Minor and Not Living with Parent(s), Who has Custody? No data recorded Is CPS involved or ever been involved? Never  Is APS involved or ever been involved? Never   Patient Determined To Be At Risk for Harm To Self or Others Based on Review of Patient Reported Information or Presenting Complaint? Yes, for Self-Harm  Method: Plan with intent and identified person  Availability of Means: No  access or NA  Intent: Intends to cause physical harm but not necessarily death  Notification Required: No data recorded Additional Information for Danger to Others Potential: No data recorded Additional Comments for Danger to Others Potential:  No data recorded Are There Guns or Other Weapons in Your Home? No  Types of Guns/Weapons: No data recorded Are These Weapons Safely Secured?                            No  Who Could Verify You Are Able To Have These Secured: No data recorded Do You Have any Outstanding Charges, Pending Court Dates, Parole/Probation? No data recorded Contacted To Inform of Risk of Harm To Self or Others: No data recorded  Does Patient Present under Involuntary Commitment? No    [place] of Residence: [place]   Patient Currently Receiving the Following Services: Medication Management   Determination of Need: Emergent (2 hours)   Options For Referral: ED Visit; Inpatient Hospitalization; Medication Management   Disposition Recommendation per psychiatric provider: Pending  David Salinas David Salinas, LCAS

## 2024-01-30 NOTE — ED Provider Notes (Signed)
 [facility] Provider Note    Event Date/Time   First MD Initiated Contact with Patient 01/30/24 1901     (approximate)   History   Chief Complaint No chief complaint on file.   HPI  David Salinas is a 33 y.o. male with past medical history of hypertension, hyperlipidemia, and depression who presents to the ED for psychiatric evaluation.  Patient reportedly had a session with his therapist earlier today, where he divulged escalating thoughts of pedophilia, necrophilia, and urges of rape.  At the time of my evaluation he denies any urges to act on these feelings and denies any thoughts of harming himself.  He states he has been taking his medications as prescribed and denies any substance use.  He denies any medical complaints at this time.     Physical Exam   Triage Vital Signs: ED Triage Vitals [01/30/24 1848]  Encounter Vitals Group     BP (!) 140/97     Systolic BP Percentile      Diastolic BP Percentile      Pulse Rate 73     Resp 16     Temp 98 F (36.7 C)     Temp Source Oral     SpO2 98 %     Weight 195 lb 1.7 oz (88.5 kg)     Height      Head Circumference      Peak Flow      Pain Score 0     Pain Loc      Pain Education      Exclude from Growth Chart     Most recent vital signs: Vitals:   01/30/24 1848  BP: (!) 140/97  Pulse: 73  Resp: 16  Temp: 98 F (36.7 C)  SpO2: 98%    Constitutional: Alert and oriented. Eyes: Conjunctivae are normal. Head: Atraumatic. Nose: No congestion/rhinnorhea. Mouth/Throat: Mucous membranes are moist.  Cardiovascular: Normal rate, regular rhythm. Grossly normal heart sounds.  2+ radial pulses bilaterally. Respiratory: Normal respiratory effort.  No retractions. Lungs CTAB. Gastrointestinal: Soft and nontender. No distention. Musculoskeletal: No lower extremity tenderness nor edema.  Neurologic:  Normal speech and language. No gross focal neurologic deficits are appreciated.    ED  Results / Procedures / Treatments   Labs (all labs ordered are listed, but only abnormal results are displayed) Labs Reviewed  COMPREHENSIVE METABOLIC PANEL WITH GFR - Abnormal; Notable for the following components:      Result Value   Sodium 134 (*)    Glucose, Bld 126 (*)    Creatinine, Ser 1.40 (*)    All other components within normal limits  CBC - Abnormal; Notable for the following components:   WBC 14.9 (*)    All other components within normal limits  ETHANOL  SALICYLATE LEVEL  ACETAMINOPHEN LEVEL  URINE DRUG SCREEN, QUALITATIVE ([facility] ONLY)    PROCEDURES:  Critical Care performed: No  Procedures   MEDICATIONS ORDERED IN ED: Medications - No data to display   IMPRESSION / MDM / ASSESSMENT AND PLAN / ED COURSE  I reviewed the triage vital signs and the nursing notes.                              33 y.o. male with past medical history of hypertension, hyperlipidemia, and depression who presents to the ED for psychiatric evaluation after reporting escalating thoughts of pedophilia and rape to his therapist.  Patient's presentation is most consistent with acute presentation with potential threat to life or bodily function.  Differential diagnosis includes, but is not limited to, suicidal ideation, homicidal ideation, psychosis, depression, anxiety, medication noncompliance, substance abuse.  Patient nontoxic-appearing and in no acute distress, vital signs are unremarkable.  He denies any medical complaints and screening labs are reassuring with no significant anemia, lecture light abnormality, or AKI.  He does have a mild leukocytosis but no symptoms to suggest infection.  Alcohol level is undetectable and patient may be medically cleared for psychiatric disposition.  Psych consult is pending at this time.  The patient has been placed in psychiatric observation due to the need to provide a safe environment for the patient while obtaining psychiatric consultation and  evaluation, as well as ongoing medical and medication management to treat the patient's condition.  The patient has been placed under full IVC at this time.       FINAL CLINICAL IMPRESSION(S) / ED DIAGNOSES   Final diagnoses:  Homicidal ideation     Rx / DC Orders   ED Discharge Orders     None        Note:  This document was prepared using Dragon voice recognition software and may include unintentional dictation errors.   Chesley Noon, MD 01/30/24 [phone]

## 2024-01-30 NOTE — Consult Note (Cosign Needed Addendum)
 [facility] Consult Note  Patient Name: David Salinas MRN: [id number] DOB: [birth year] DATE OF Consult: 01/30/2024  PRIMARY PSYCHIATRIC DIAGNOSES  1.  Mood Disorder, Unspecified 2. Homicidal Ideations R/O Pedophilic Disorder History of MDD with Psychotic Features   RECOMMENDATIONS  Recommendations: Medication recommendations:  Will restart home medication regimen: -- Prozac 40mg  po daily for depression and anxiety -- Risperdal 2mg  po at bedtime for history of psychosis, mood -- Trazodone 100mg  po at bedtime PRN for insomnia -- Zyprexa 10mg  PO/IM Q6H PRN for acute agitation  -- Hydroxyzine 25mg  po TID PRN for anxiety  Non-Medication/therapeutic recommendations:  -- Homicidal Precautions, Psychiatric Precautions -- Recommend upholding IVC petition at this time  -- Inpatient hospitalization   Is inpatient psychiatric hospitalization recommended for this patient? Yes (Explain why): Patient meets criteria for inpatient hospitalization. He is currently on IVC petition and would recommend upholding at this time.   Follow-Up Telepsychiatry C/L services: We will continue to follow this patient with you until stabilized or discharged.  If you have any questions or concerns, please call our TeleCare Coordination service at  [phone] and ask for myself or the provider on-call.  Communication: Treatment team members (and family members if applicable) who were involved in treatment/care discussions and planning, and with whom we spoke or engaged with via secure text/chat, include the following: ED primary team  Thank you for involving [place] in the care of this patient. If you have any additional questions or concerns, please call [phone] and ask for me or the provider on-call.  TELEPSYCHIATRY ATTESTATION & CONSENT  As the provider for this telehealth consult, I attest that I verified the patient's identity using two separate identifiers, introduced myself to the patient, provided my  credentials, disclosed my location, and performed this encounter via a HIPAA-compliant, real-time, face-to-face, two-way, interactive audio and video platform and with the full consent and agreement of the patient (or guardian as applicable.)  Patient physical location: ED in [facility]. Telehealth provider physical location: home office in state of [place].  Video start time: 2300 (Central Time) Video end time: 2311 (Central Time)  IDENTIFYING DATA  David Salinas is a 33 y.o. year-old male for whom a psychiatric consultation has been ordered by the primary provider. The patient was identified using two separate identifiers.  CHIEF COMPLAINT/REASON FOR CONSULT  Homicidal Ideations   HISTORY OF PRESENT ILLNESS (HPI)  The patient is a 33yo male who presented to the emergency department with LEO under IVC petitioned by LCSW at [facility]. Patient expressed escalating pedophilia, necrophilia, and urges of rape directed at both children and adults. Therapist believed it would be in the publics best interest if patient was removed for evaluation and treatment. Per IVC petition, history of mental illness on psychotropic meds. Escalating pedophilia, necrophilia, and urges of rape. Under care of [facility], symptoms divulged in session.  Upon interview, patient is calm and cooperative, alert and oriented x4. Behavior appropriate but nonchalant, no agitation observed. Thought process is disturbing. Provided fluid, coherent responses. Patient states he is in the hospital tonight because his therapist called and was concerned. Patient states today in his therapy session he told his therapist that he had been having increasing sexual urges and they have become more intense. Patient expresses he is having thoughts of rape and wanting to carrying out action. He states these acts and thoughts are directed at children mainly, but also adults. He is having racing thoughts daily of rape; states they are  constant. Patient  endorses wanting to harm others by rape without specific target. He denies any triggering events or stressors at this time. Unable to identify alleviating factors at this time. He denies history of rape or acting on these thoughts. He is not sure if he would act on them but states the urges and thoughts are increasing in intensity. Thoughts started 3 months ago. He denies being a victim of sexual assault. He denies suicidal ideations. History of self-injurious behaviors by cutting when he was a teenager. No prior suicide attempts. He denies hallucinations, paranoia, delusions.   Patient lives with his grandparents, states things at home are well. Sleeping and eating normal. Expresses mild increase in anxiety due to having occasional "adrenaline rushes". Denies recent depressive moods. No agitation. No drug or alcohol use.     PAST PSYCHIATRIC HISTORY  No prior psychiatric hospitalizations, per patient. However per chart review,  admitted to [facility] 11/02/2020- 11/08/2023 for MDD with psychotic features, SI, and HI. HPI states patient endorsed worsening suicidal ideations with plan to hang himself, homicidal ideations towards his neighbor, and increasing intrusive thoughts of sexually violent acts. Endorsed commanding auditory hallucinations to act on these impulses. Discharged on Prozac 40mg  po daily, Paxil 10mg  po daily, Risperdal 2mg  at bedtime, and Trazodone 100mg  at bedtime PRN.   Otherwise as per HPI above.  PAST MEDICAL HISTORY  Past Medical History:  Diagnosis Date   Anxiety    Asthma    Chronic bronchitis (HCC)    Panic attacks      HOME MEDICATIONS  Facility Ordered Medications  Medication   traZODone (DESYREL) tablet 100 mg   PTA Medications  Medication Sig   albuterol (VENTOLIN HFA) 108 (90 Base) MCG/ACT inhaler Inhale 1-2 puffs into the lungs every 6 (six) hours as needed for wheezing or shortness of breath.    traZODone (DESYREL) 100 MG tablet Take 1 tablet (100 mg total) by mouth at bedtime as needed for sleep.   metoprolol succinate (TOPROL-XL) 50 MG 24 hr tablet Take 1 tablet (50 mg total) by mouth daily.   atorvastatin (LIPITOR) 10 MG tablet Take 1 tablet (10 mg total) by mouth daily.   losartan (COZAAR) 100 MG tablet Take 1 tablet (100 mg total) by mouth daily after breakfast.   FLUoxetine (PROZAC) 40 MG capsule Take 1 capsule (40 mg total) by mouth daily.   PARoxetine (PAXIL) 10 MG tablet Take 1 tablet (10 mg total) by mouth daily.   risperiDONE (RISPERDAL) 2 MG tablet Take 1 tablet (2 mg total) by mouth at bedtime.   gabapentin (NEURONTIN) 100 MG capsule Take 1 capsule (100 mg total) by mouth 3 (three) times daily.     ALLERGIES  Allergies  Allergen Reactions   Codeine     Heart racing      Penicillins     Anaphylaxis     SOCIAL & SUBSTANCE USE HISTORY  Social History   Socioeconomic History   Marital status: Legally Separated    Spouse name: Not on file   Number of children: Not on file   Years of education: Not on file   Highest education level: Not on file  Occupational History   Not on file  Tobacco Use   Smoking status: Former    Current packs/day: 0.50    Types: Cigarettes   Smokeless tobacco: Never  Vaping Use   Vaping status: Never Used  Substance and Sexual Activity   Alcohol use: Not Currently   Drug use: No  Sexual activity: Not on file  Other Topics Concern   Not on file  Social History Narrative   Not on file   Social Drivers of Health   Financial Resource Strain: Not on file  Food Insecurity: Food Insecurity Present (11/03/2023)   Hunger Vital Sign    Worried About Running Out of Food in the Last Year: Sometimes true    Ran Out of Food in the Last Year: Sometimes true  Transportation Needs: No Transportation Needs (11/03/2023)   PRAPARE - Administrator, Civil Service (Medical): No    Lack of Transportation (Non-Medical): No  Physical  Activity: Not on file  Stress: Not on file  Social Connections: Socially Isolated (10/07/2023)   Social Connection and Isolation Panel [NHANES]    Frequency of Communication with Friends and Family: More than three times a week    Frequency of Social Gatherings with Friends and Family: More than three times a week    Attends Religious Services: Never    Database administrator or Organizations: No    Attends Engineer, structural: Never    Marital Status: Separated   Social History   Tobacco Use  Smoking Status Former   Current packs/day: 0.50   Types: Cigarettes  Smokeless Tobacco Never   Social History   Substance and Sexual Activity  Alcohol Use Not Currently   Social History   Substance and Sexual Activity  Drug Use No    Additional pertinent information: lives with grandparents, unemployed   FAMILY HISTORY  Family History  Problem Relation Age of Onset   Anxiety disorder Mother    Depression Mother    Hypertension Mother    Diabetes Mother    Hypothyroidism Mother    Anxiety disorder Father    Hypertension Father    Hyperlipidemia Father    Lymphoma Maternal Grandfather      MENTAL STATUS EXAM (MSE)  Mental Status Exam: General Appearance: Fairly Groomed  Orientation:  Full (Time, Place, and Person)  Memory:  Recent;   Good  Concentration:  Concentration: Good  Recall:  Good  Attention  Good  Eye Contact:  Good  Speech:  Clear and Coherent  Language:  Good  Volume:  Normal  Mood: "pretty good", nonchalant   Affect:  Inappropriate  Thought Process:  Disturbing   Thought Content:   intrusive sexual actc  Suicidal Thoughts:  No  Homicidal Thoughts:  Yes.  with intent/plan  Judgement:  Poor  Insight:   poor  Psychomotor Activity:  Normal  Akathisia:  No  Fund of Knowledge:  Good    Assets:  Communication Skills Housing Physical Health  Cognition:  WNL  ADL's:  Intact  AIMS (if indicated):       VITALS  Blood pressure (!) 140/97,  pulse 73, temperature 98 F (36.7 C), temperature source Oral, resp. rate 16, weight 88.5 kg, SpO2 98%.  LABS  Admission on 01/30/2024  Component Date Value Ref Range Status   Sodium 01/30/2024 134 (L)  135 - 145 mmol/L Final   Potassium 01/30/2024 3.6  3.5 - 5.1 mmol/L Final   Chloride 01/30/2024 102  98 - 111 mmol/L Final   CO2 01/30/2024 23  22 - 32 mmol/L Final   Glucose, Bld 01/30/2024 126 (H)  70 - 99 mg/dL Final   Glucose reference range applies only to samples taken after fasting for at least 8 hours.   BUN 01/30/2024 17  6 - 20 mg/dL Final   Creatinine,  Ser 01/30/2024 1.40 (H)  0.61 - 1.24 mg/dL Final   Calcium 60/45/4098 9.5  8.9 - 10.3 mg/dL Final   Total Protein 11/91/4782 7.8  6.5 - 8.1 g/dL Final   Albumin 95/62/1308 4.6  3.5 - 5.0 g/dL Final   AST 65/78/4696 18  15 - 41 U/L Final   ALT 01/30/2024 30  0 - 44 U/L Final   Alkaline Phosphatase 01/30/2024 51  38 - 126 U/L Final   Total Bilirubin 01/30/2024 0.6  0.0 - 1.2 mg/dL Final   GFR, Estimated 01/30/2024 >60  >60 mL/min Final   Comment: (NOTE) Calculated using the CKD-EPI Creatinine Equation (2021)    Anion gap 01/30/2024 9  5 - 15 Final   Performed at [facility], [address] [place], [place] [address]   Alcohol, Ethyl (B) 01/30/2024 <10  <10 mg/dL Final   Comment: (NOTE) Lowest detectable limit for serum alcohol is 10 mg/dL.  For medical purposes only. Performed at [facility], [address] [place], [place] [address]    Salicylate Lvl 01/30/2024 <7.0 (L)  7.0 - 30.0 mg/dL Final   Performed at [facility], [address] [place], [place] [address]   Acetaminophen (Tylenol), Serum 01/30/2024 <10 (L)  10 - 30 ug/mL Final   Comment: (NOTE) Therapeutic concentrations vary significantly. A range of 10-30 ug/mL  may be an effective concentration for many patients. However, some  are best treated at concentrations outside of this range. Acetaminophen concentrations >150 ug/mL  at 4 hours after ingestion  and >50 ug/mL at 12 hours after ingestion are often associated with  toxic reactions.  Performed at [facility], [address] [place], [place] [address]    Tricyclic, Ur Screen 01/30/2024 NONE DETECTED  NONE DETECTED Final   Amphetamines, Ur Screen 01/30/2024 NONE DETECTED  NONE DETECTED Final   MDMA (Ecstasy)Ur Screen 01/30/2024 NONE DETECTED  NONE DETECTED Final   Cocaine Metabolite,Ur [place] 01/30/2024 NONE DETECTED  NONE DETECTED Final   Opiate, Ur Screen 01/30/2024 NONE DETECTED  NONE DETECTED Final   Phencyclidine (PCP) Ur S 01/30/2024 NONE DETECTED  NONE DETECTED Final   Cannabinoid 50 Ng, Ur [place] 01/30/2024 NONE DETECTED  NONE DETECTED Final   Barbiturates, Ur Screen 01/30/2024 NONE DETECTED  NONE DETECTED Final   Benzodiazepine, Ur Scrn 01/30/2024 NONE DETECTED  NONE DETECTED Final   Methadone Scn, Ur 01/30/2024 NONE DETECTED  NONE DETECTED Final   Comment: (NOTE) Tricyclics + metabolites, urine    Cutoff 1000 ng/mL Amphetamines + metabolites, urine  Cutoff 1000 ng/mL MDMA (Ecstasy), urine              Cutoff 500 ng/mL Cocaine Metabolite, urine          Cutoff 300 ng/mL Opiate + metabolites, urine        Cutoff 300 ng/mL Phencyclidine (PCP), urine         Cutoff 25 ng/mL Cannabinoid, urine                 Cutoff 50 ng/mL Barbiturates + metabolites, urine  Cutoff 200 ng/mL Benzodiazepine, urine              Cutoff 200 ng/mL Methadone, urine                   Cutoff 300 ng/mL  The urine drug screen provides only a preliminary, unconfirmed analytical test result and should not be used for non-medical purposes. Clinical consideration and professional judgment should be applied  to any positive drug screen result due to possible interfering substances. A more specific alternate chemical method must be used in order to obtain a confirmed analytical result. Gas chromatography / mass spectrometry (GC/MS) is the preferred confirm                           atory method. Performed at [facility], [address] [place], [place] [address]    WBC 01/30/2024 14.9 (H)  4.0 - 10.5 K/uL Final   RBC 01/30/2024 5.10  4.22 - 5.81 MIL/uL Final   Hemoglobin 01/30/2024 15.1  13.0 - 17.0 g/dL Final   HCT 09/81/1914 43.3  39.0 - 52.0 % Final   MCV 01/30/2024 84.9  80.0 - 100.0 fL Final   MCH 01/30/2024 29.6  26.0 - 34.0 pg Final   MCHC 01/30/2024 34.9  30.0 - 36.0 g/dL Final   RDW 78/29/5621 12.5  11.5 - 15.5 % Final   Platelets 01/30/2024 371  150 - 400 K/uL Final   nRBC 01/30/2024 0.0  0.0 - 0.2 % Final   Performed at [facility], [address] [place], [place] [address]    PSYCHIATRIC REVIEW OF SYSTEMS (ROS)  ROS: Notable for the following relevant positive findings: Review of Systems  Psychiatric/Behavioral:  Negative for depression, hallucinations, substance abuse and suicidal ideas. The patient is nervous/anxious.     Additional findings:      Musculoskeletal: No abnormal movements observed      Gait & Station: Laying/Sitting      Pain Screening: Denies      Nutrition & Dental Concerns: No concerns at this time   RISK FORMULATION/ASSESSMENT  Is the patient experiencing any suicidal or homicidal ideations: Yes       Explain if yes: Homicidal Ideations- sexually violent acts- wants to rape both children and adults. Protective factors considered for safety management: access to care, housing   Risk factors/concerns considered for safety management: homicidal ideations, disturbing thought process, poor insight and judgment  Prior attempt Depression Male gender Unmarried  Is there a safety management plan with the patient and treatment team to minimize risk factors and promote protective factors: Yes           Explain: currently in the ED, psychiatric precautions, uphold IVC, inpatient hospitalization   Is crisis care placement or psychiatric hospitalization recommended: Yes     Based on my current  evaluation and risk assessment, patient is determined at this time to be at:  High risk  *RISK ASSESSMENT Risk assessment is a dynamic process; it is possible that this patient's condition, and risk level, may change. This should be re-evaluated and managed over time as appropriate. Please re-consult psychiatric consult services if additional assistance is needed in terms of risk assessment and management. If your team decides to discharge this patient, please advise the patient how to best access emergency psychiatric services, or to call 911, if their condition worsens or they feel unsafe in any way.   Assunta Gambles, NP Telepsychiatry Consult Services

## 2024-01-30 NOTE — ED Triage Notes (Signed)
 Arrives with [facility] under IVC paperwork. Patient lives with grandparents and per IVC paperwork, patient with escalating pedophelia, necrophelia, urges of rape.

## 2024-01-30 NOTE — Consult Note (Incomplete)
 [facility] Consult Note  Patient Name: David Salinas MRN: [id number] DOB: 1991/04/25 DATE OF Consult: 01/30/2024  PRIMARY PSYCHIATRIC DIAGNOSES  1.  *** 2.  *** 3.  ***  RECOMMENDATIONS  Recommendations: Medication recommendations: *** Non-Medication/therapeutic recommendations: *** Is inpatient psychiatric hospitalization recommended for this patient? {Yes/No/why:304550013} Follow-Up Telepsychiatry C/L services: {Telespych NW:295621308} Communication: Treatment team members (and family members if applicable) who were involved in treatment/care discussions and planning, and with whom we spoke or engaged with via secure text/chat, include the following: ***  Thank you for involving [place] in the care of this patient. If you have any additional questions or concerns, please call [phone] and ask for me or the provider on-call.  TELEPSYCHIATRY ATTESTATION & CONSENT  As the provider for this telehealth consult, I attest that I verified the patient's identity using two separate identifiers, introduced myself to the patient, provided my credentials, disclosed my location, and performed this encounter via a HIPAA-compliant, real-time, face-to-face, two-way, interactive audio and video platform and with the full consent and agreement of the patient (or guardian as applicable.)  Patient physical location: ED in [facility]. Telehealth provider physical location: home office in state of [place].  Video start time: 2300 (Central Time) Video end time: *** (Central Time)  IDENTIFYING DATA  David Salinas is a 33 y.o. year-old male for whom a psychiatric consultation has been ordered by the primary provider. The patient was identified using two separate identifiers.  CHIEF COMPLAINT/REASON FOR CONSULT  Homicidal Ideations   HISTORY OF PRESENT ILLNESS (HPI)  The patient ***.   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 Escalating  pedophelia, necrophelia, and urges of rape. Under care of [facility], symptoms divulgued in session. Elease Etienne, LSCW [facility].          PAST PSYCHIATRIC HISTORY  *** Otherwise as per HPI above.  PAST MEDICAL HISTORY  Past Medical History:  Diagnosis Date  . Anxiety   . Asthma   . Chronic bronchitis (HCC)   . Panic attacks      HOME MEDICATIONS  Facility Ordered Medications  Medication  . traZODone (DESYREL) tablet 100 mg   PTA Medications  Medication Sig  . albuterol (VENTOLIN HFA) 108 (90 Base) MCG/ACT inhaler Inhale 1-2 puffs into the lungs every 6 (six) hours as needed for wheezing or shortness of breath.  . traZODone (DESYREL) 100 MG tablet Take 1 tablet (100 mg total) by mouth at bedtime as needed for sleep.  . metoprolol succinate (TOPROL-XL) 50 MG 24 hr tablet Take 1 tablet (50 mg total) by mouth daily.  Marland Kitchen atorvastatin (LIPITOR) 10 MG tablet Take 1 tablet (10 mg total) by mouth daily.  Marland Kitchen losartan (COZAAR) 100 MG tablet Take 1 tablet (100 mg total) by mouth daily after breakfast.  . FLUoxetine (PROZAC) 40 MG capsule Take 1 capsule (40 mg total) by mouth daily.  Marland Kitchen PARoxetine (PAXIL) 10 MG tablet Take 1 tablet (10 mg total) by mouth daily.  . risperiDONE (RISPERDAL) 2 MG tablet Take 1 tablet (2 mg total) by mouth at bedtime.  . gabapentin (NEURONTIN) 100 MG capsule Take 1 capsule (100 mg total) by mouth 3 (three) times daily.     ALLERGIES  Allergies  Allergen Reactions  . Codeine     Heart racing     . Penicillins     Anaphylaxis     SOCIAL & SUBSTANCE USE HISTORY  Social History   Socioeconomic History  . Marital status: Legally Separated  Spouse name: Not on file  . Number of children: Not on file  . Years of education: Not on file  . Highest education level: Not on file  Occupational History  . Not on file  Tobacco Use  . Smoking status: Former    Current packs/day: 0.50    Types: Cigarettes  . Smokeless tobacco: Never  Vaping Use  . Vaping status:  Never Used  Substance and Sexual Activity  . Alcohol use: Not Currently  . Drug use: No  . Sexual activity: Not on file  Other Topics Concern  . Not on file  Social History Narrative  . Not on file   Social Drivers of Health   Financial Resource Strain: Not on file  Food Insecurity: Food Insecurity Present (11/03/2023)   Hunger Vital Sign   . Worried About Programme researcher, broadcasting/film/video in the Last Year: Sometimes true   . Ran Out of Food in the Last Year: Sometimes true  Transportation Needs: No Transportation Needs (11/03/2023)   PRAPARE - Transportation   . Lack of Transportation (Medical): No   . Lack of Transportation (Non-Medical): No  Physical Activity: Not on file  Stress: Not on file  Social Connections: Socially Isolated (10/07/2023)   Social Connection and Isolation Panel [NHANES]   . Frequency of Communication with Friends and Family: More than three times a week   . Frequency of Social Gatherings with Friends and Family: More than three times a week   . Attends Religious Services: Never   . Active Member of Clubs or Organizations: No   . Attends Banker Meetings: Never   . Marital Status: Separated   Social History   Tobacco Use  Smoking Status Former  . Current packs/day: 0.50  . Types: Cigarettes  Smokeless Tobacco Never   Social History   Substance and Sexual Activity  Alcohol Use Not Currently   Social History   Substance and Sexual Activity  Drug Use No    Additional pertinent information ***.  FAMILY HISTORY  Family History  Problem Relation Age of Onset  . Anxiety disorder Mother   . Depression Mother   . Hypertension Mother   . Diabetes Mother   . Hypothyroidism Mother   . Anxiety disorder Father   . Hypertension Father   . Hyperlipidemia Father   . Lymphoma Maternal Grandfather    Family Psychiatric History (if known):  ***  MENTAL STATUS EXAM (MSE)  Mental Status Exam: General Appearance: {Appearance:22683}  Orientation:   {BHH ORIENTATION (PAA):22689}  Memory:  {BHH MEMORY:22881}  Concentration:  {Concentration:21399}  Recall:  {BHH GOOD/FAIR/POOR:22877}  Attention  {BH Attention Span:31825}  Eye Contact:  {BHH EYE CONTACT:22684}  Speech:  {Speech:22685}  Language:  {BHH GOOD/FAIR/POOR:22877}  Volume:  {Volume (PAA):22686}  Mood: ***  Affect:  {Affect (PAA):22687}  Thought Process:  {Thought Process (PAA):22688}  Thought Content:  {Thought Content:22690}  Suicidal Thoughts:  {ST/HT (PAA):22692}  Homicidal Thoughts:  {ST/HT (PAA):22692}  Judgement:  {Judgement (PAA):22694}  Insight:  {Insight (PAA):22695}  Psychomotor Activity:  {Psychomotor (PAA):22696}  Akathisia:  {BHH YES OR NO:22294}  Fund of Knowledge:  {BHH GOOD/FAIR/POOR:22877}    Assets:  {Assets (PAA):22698}  Cognition:  {chl bhh cognition:304700322}  ADL's:  {BHH KVQ'Q:59563}  AIMS (if indicated):       VITALS  Blood pressure (!) 140/97, pulse 73, temperature 98 F (36.7 C), temperature source Oral, resp. rate 16, weight 88.5 kg, SpO2 98%.  LABS  Admission on 01/30/2024  Component Date Value Ref  Range Status  . Sodium 01/30/2024 134 (L)  135 - 145 mmol/L Final  . Potassium 01/30/2024 3.6  3.5 - 5.1 mmol/L Final  . Chloride 01/30/2024 102  98 - 111 mmol/L Final  . CO2 01/30/2024 23  22 - 32 mmol/L Final  . Glucose, Bld 01/30/2024 126 (H)  70 - 99 mg/dL Final   Glucose reference range applies only to samples taken after fasting for at least 8 hours.  . BUN 01/30/2024 17  6 - 20 mg/dL Final  . Creatinine, Ser 01/30/2024 1.40 (H)  0.61 - 1.24 mg/dL Final  . Calcium 16/08/9603 9.5  8.9 - 10.3 mg/dL Final  . Total Protein 01/30/2024 7.8  6.5 - 8.1 g/dL Final  . Albumin 54/07/8118 4.6  3.5 - 5.0 g/dL Final  . AST 14/78/2956 18  15 - 41 U/L Final  . ALT 01/30/2024 30  0 - 44 U/L Final  . Alkaline Phosphatase 01/30/2024 51  38 - 126 U/L Final  . Total Bilirubin 01/30/2024 0.6  0.0 - 1.2 mg/dL Final  . GFR, Estimated 01/30/2024 >60  >60  mL/min Final   Comment: (NOTE) Calculated using the CKD-EPI Creatinine Equation (2021)   . Anion gap 01/30/2024 9  5 - 15 Final   Performed at [facility], [address] [place] [place], [place] [address]  . Alcohol, Ethyl (B) 01/30/2024 <10  <10 mg/dL Final   Comment: (NOTE) Lowest detectable limit for serum alcohol is 10 mg/dL.  For medical purposes only. Performed at [facility], [address], [place], [place] [address]   . Salicylate Lvl 01/30/2024 <7.0 (L)  7.0 - 30.0 mg/dL Final   Performed at [facility], [address] [place] [place], [place] [address]  . Acetaminophen (Tylenol), Serum 01/30/2024 <10 (L)  10 - 30 ug/mL Final   Comment: (NOTE) Therapeutic concentrations vary significantly. A range of 10-30 ug/mL  may be an effective concentration for many patients. However, some  are best treated at concentrations outside of this range. Acetaminophen concentrations >150 ug/mL at 4 hours after ingestion  and >50 ug/mL at 12 hours after ingestion are often associated with  toxic reactions.  Performed at [facility], [address], [place], [place] [address]   . Tricyclic, Ur Screen 01/30/2024 NONE DETECTED  NONE DETECTED Final  . Amphetamines, Ur Screen 01/30/2024 NONE DETECTED  NONE DETECTED Final  . MDMA (Ecstasy)Ur Screen 01/30/2024 NONE DETECTED  NONE DETECTED Final  . Cocaine Metabolite,Ur [place] 01/30/2024 NONE DETECTED  NONE DETECTED Final  . Opiate, Ur Screen 01/30/2024 NONE DETECTED  NONE DETECTED Final  . Phencyclidine (PCP) Ur S 01/30/2024 NONE DETECTED  NONE DETECTED Final  . Cannabinoid 50 Ng, Ur [place] 01/30/2024 NONE DETECTED  NONE DETECTED Final  . Barbiturates, Ur Screen 01/30/2024 NONE DETECTED  NONE DETECTED Final  . Benzodiazepine, Ur Scrn 01/30/2024 NONE DETECTED  NONE DETECTED Final  . Methadone Scn, Ur 01/30/2024 NONE DETECTED  NONE DETECTED Final   Comment: (NOTE) Tricyclics + metabolites, urine    Cutoff 1000  ng/mL Amphetamines + metabolites, urine  Cutoff 1000 ng/mL MDMA (Ecstasy), urine              Cutoff 500 ng/mL Cocaine Metabolite, urine          Cutoff 300 ng/mL Opiate + metabolites, urine        Cutoff 300 ng/mL Phencyclidine (PCP), urine         Cutoff 25 ng/mL Cannabinoid, urine  Cutoff 50 ng/mL Barbiturates + metabolites, urine  Cutoff 200 ng/mL Benzodiazepine, urine              Cutoff 200 ng/mL Methadone, urine                   Cutoff 300 ng/mL  The urine drug screen provides only a preliminary, unconfirmed analytical test result and should not be used for non-medical purposes. Clinical consideration and professional judgment should be applied to any positive drug screen result due to possible interfering substances. A more specific alternate chemical method must be used in order to obtain a confirmed analytical result. Gas chromatography / mass spectrometry (GC/MS) is the preferred confirm                          atory method. Performed at [facility], [address] [place], [place] [address]   . WBC 01/30/2024 14.9 (H)  4.0 - 10.5 K/uL Final  . RBC 01/30/2024 5.10  4.22 - 5.81 MIL/uL Final  . Hemoglobin 01/30/2024 15.1  13.0 - 17.0 g/dL Final  . HCT 40/98/1191 43.3  39.0 - 52.0 % Final  . MCV 01/30/2024 84.9  80.0 - 100.0 fL Final  . MCH 01/30/2024 29.6  26.0 - 34.0 pg Final  . MCHC 01/30/2024 34.9  30.0 - 36.0 g/dL Final  . RDW 47/82/9562 12.5  11.5 - 15.5 % Final  . Platelets 01/30/2024 371  150 - 400 K/uL Final  . nRBC 01/30/2024 0.0  0.0 - 0.2 % Final   Performed at [facility], [address] [place], [place] [address]    PSYCHIATRIC REVIEW OF SYSTEMS (ROS)  ROS: Notable for the following relevant positive findings: ROS  Additional findings:      Musculoskeletal: {Musculoskeletal neeeds/assessment:304550014}      Gait & Station: {Gait and Station:304550016}      Pain Screening: {Pain Description:304550015}       Nutrition & Dental Concerns: {Nutrition & Dental Concerns:304550017}  RISK FORMULATION/ASSESSMENT  Is the patient experiencing any suicidal or homicidal ideations: {yes/no:20286}       Explain if yes: *** Protective factors considered for safety management: ***  Risk factors/concerns considered for safety management: *** {CHL BH Risk Factors Safety Management:304550011}  Is there a safety management plan with the patient and treatment team to minimize risk factors and promote protective factors: {yes/no:20286}           Explain: *** Is crisis care placement or psychiatric hospitalization recommended: {yes/no:20286}     Based on my current evaluation and risk assessment, patient is determined at this time to be at:  {Risk level:304550009}  *RISK ASSESSMENT Risk assessment is a dynamic process; it is possible that this patient's condition, and risk level, may change. This should be re-evaluated and managed over time as appropriate. Please re-consult psychiatric consult services if additional assistance is needed in terms of risk assessment and management. If your team decides to discharge this patient, please advise the patient how to best access emergency psychiatric services, or to call 911, if their condition worsens or they feel unsafe in any way.   Assunta Gambles, NP Telepsychiatry Consult Services

## 2024-01-30 NOTE — ED Notes (Signed)
pt recieved snack and drink 

## 2024-01-31 ENCOUNTER — Inpatient Hospital Stay
Admission: AD | Admit: 2024-01-31 | Discharge: 2024-02-06 | DRG: 885 | Disposition: A | Discharge status: Home / Self Care | Payer: MEDICAID | Source: Intra-hospital | Attending: Psychiatry | Admitting: Psychiatry

## 2024-01-31 ENCOUNTER — Other Ambulatory Visit: Payer: Self-pay

## 2024-01-31 ENCOUNTER — Encounter: Payer: Self-pay | Admitting: Adult Health

## 2024-01-31 DIAGNOSIS — F411 Generalized anxiety disorder: Secondary | ICD-10-CM | POA: Diagnosis present

## 2024-01-31 DIAGNOSIS — Z880 Allergy status to penicillin: Secondary | ICD-10-CM | POA: Diagnosis not present

## 2024-01-31 DIAGNOSIS — Z818 Family history of other mental and behavioral disorders: Secondary | ICD-10-CM

## 2024-01-31 DIAGNOSIS — Z635 Disruption of family by separation and divorce: Secondary | ICD-10-CM | POA: Diagnosis not present

## 2024-01-31 DIAGNOSIS — Z8249 Family history of ischemic heart disease and other diseases of the circulatory system: Secondary | ICD-10-CM | POA: Diagnosis not present

## 2024-01-31 DIAGNOSIS — Z83438 Family history of other disorder of lipoprotein metabolism and other lipidemia: Secondary | ICD-10-CM | POA: Diagnosis not present

## 2024-01-31 DIAGNOSIS — F323 Major depressive disorder, single episode, severe with psychotic features: Secondary | ICD-10-CM | POA: Diagnosis present

## 2024-01-31 DIAGNOSIS — J4489 Other specified chronic obstructive pulmonary disease: Secondary | ICD-10-CM | POA: Diagnosis present

## 2024-01-31 DIAGNOSIS — R45851 Suicidal ideations: Secondary | ICD-10-CM | POA: Diagnosis present

## 2024-01-31 DIAGNOSIS — Z87891 Personal history of nicotine dependence: Secondary | ICD-10-CM | POA: Diagnosis not present

## 2024-01-31 DIAGNOSIS — I10 Essential (primary) hypertension: Secondary | ICD-10-CM | POA: Diagnosis present

## 2024-01-31 DIAGNOSIS — Z5948 Other specified lack of adequate food: Secondary | ICD-10-CM | POA: Diagnosis not present

## 2024-01-31 DIAGNOSIS — Z5941 Food insecurity: Secondary | ICD-10-CM

## 2024-01-31 DIAGNOSIS — F39 Unspecified mood [affective] disorder: Secondary | ICD-10-CM | POA: Diagnosis not present

## 2024-01-31 DIAGNOSIS — Z79899 Other long term (current) drug therapy: Secondary | ICD-10-CM

## 2024-01-31 DIAGNOSIS — R001 Bradycardia, unspecified: Secondary | ICD-10-CM | POA: Diagnosis present

## 2024-01-31 DIAGNOSIS — Z62810 Personal history of physical and sexual abuse in childhood: Secondary | ICD-10-CM

## 2024-01-31 DIAGNOSIS — R45850 Homicidal ideations: Secondary | ICD-10-CM

## 2024-01-31 DIAGNOSIS — Z885 Allergy status to narcotic agent status: Secondary | ICD-10-CM | POA: Diagnosis not present

## 2024-01-31 DIAGNOSIS — Z9151 Personal history of suicidal behavior: Secondary | ICD-10-CM | POA: Diagnosis not present

## 2024-01-31 DIAGNOSIS — F410 Panic disorder [episodic paroxysmal anxiety] without agoraphobia: Secondary | ICD-10-CM | POA: Diagnosis present

## 2024-01-31 DIAGNOSIS — Z604 Social exclusion and rejection: Secondary | ICD-10-CM | POA: Diagnosis present

## 2024-01-31 DIAGNOSIS — E785 Hyperlipidemia, unspecified: Secondary | ICD-10-CM | POA: Diagnosis present

## 2024-01-31 DIAGNOSIS — Z653 Problems related to other legal circumstances: Secondary | ICD-10-CM

## 2024-01-31 HISTORY — DX: Essential (primary) hypertension: I10

## 2024-01-31 MED ORDER — LOSARTAN POTASSIUM 50 MG PO TABS
100.0000 mg | ORAL_TABLET | Freq: Every day | ORAL | Status: DC
  Administered 2024-02-01 – 2024-02-06 (×6): 100 mg via ORAL
  Filled 2024-01-31 (×7): qty 2

## 2024-01-31 MED ORDER — ATORVASTATIN CALCIUM 20 MG PO TABS
10.0000 mg | ORAL_TABLET | Freq: Every day | ORAL | Status: DC
  Administered 2024-01-31: 10 mg via ORAL
  Filled 2024-01-31: qty 1

## 2024-01-31 MED ORDER — OLANZAPINE 5 MG PO TBDP: 5.0000 mg | ORAL_TABLET | Freq: Four times a day (QID) | ORAL | Status: DC | PRN

## 2024-01-31 MED ORDER — DIPHENHYDRAMINE HCL 50 MG/ML IJ SOLN: 50.0000 mg | Freq: Three times a day (TID) | INTRAMUSCULAR | Status: DC | PRN

## 2024-01-31 MED ORDER — METOPROLOL SUCCINATE ER 25 MG PO TB24
50.0000 mg | ORAL_TABLET | Freq: Every day | ORAL | Status: DC
  Administered 2024-01-31 – 2024-02-06 (×5): 50 mg via ORAL
  Filled 2024-01-31 (×6): qty 2

## 2024-01-31 MED ORDER — HALOPERIDOL LACTATE 5 MG/ML IJ SOLN: 5.0000 mg | Freq: Three times a day (TID) | INTRAMUSCULAR | Status: DC | PRN

## 2024-01-31 MED ORDER — TRAZODONE HCL 100 MG PO TABS
100.0000 mg | ORAL_TABLET | Freq: Every evening | ORAL | Status: DC | PRN
Start: 2024-01-31 — End: 2024-02-06
  Administered 2024-02-01 – 2024-02-05 (×5): 100 mg via ORAL
  Filled 2024-01-31 (×5): qty 1

## 2024-01-31 MED ORDER — RISPERIDONE 1 MG PO TABS
2.0000 mg | ORAL_TABLET | Freq: Every day | ORAL | Status: DC
Start: 2024-01-31 — End: 2024-01-31
  Administered 2024-01-31: 2 mg via ORAL
  Filled 2024-01-31: qty 2

## 2024-01-31 MED ORDER — ALUM & MAG HYDROXIDE-SIMETH 200-200-20 MG/5ML PO SUSP: 30.0000 mL | ORAL | Status: DC | PRN

## 2024-01-31 MED ORDER — DIPHENHYDRAMINE HCL 25 MG PO CAPS: 50.0000 mg | ORAL_CAPSULE | Freq: Three times a day (TID) | ORAL | Status: DC | PRN

## 2024-01-31 MED ORDER — HALOPERIDOL LACTATE 5 MG/ML IJ SOLN: 10.0000 mg | Freq: Three times a day (TID) | INTRAMUSCULAR | Status: DC | PRN

## 2024-01-31 MED ORDER — FLUOXETINE HCL 20 MG PO CAPS
40.0000 mg | ORAL_CAPSULE | Freq: Every day | ORAL | Status: DC
  Administered 2024-02-01: 40 mg via ORAL
  Filled 2024-01-31: qty 2

## 2024-01-31 MED ORDER — ACETAMINOPHEN 325 MG PO TABS
650.0000 mg | ORAL_TABLET | Freq: Four times a day (QID) | ORAL | Status: DC | PRN
  Administered 2024-02-02: 650 mg via ORAL
  Filled 2024-01-31: qty 2

## 2024-01-31 MED ORDER — HYDROXYZINE HCL 25 MG PO TABS: 25.0000 mg | ORAL_TABLET | Freq: Three times a day (TID) | ORAL | Status: DC | PRN

## 2024-01-31 MED ORDER — LOSARTAN POTASSIUM 50 MG PO TABS: 100.0000 mg | ORAL_TABLET | Freq: Every day | ORAL | Status: DC

## 2024-01-31 MED ORDER — GABAPENTIN 100 MG PO CAPS
100.0000 mg | ORAL_CAPSULE | Freq: Three times a day (TID) | ORAL | Status: DC
  Administered 2024-01-31 – 2024-02-06 (×18): 100 mg via ORAL
  Filled 2024-01-31 (×18): qty 1

## 2024-01-31 MED ORDER — GABAPENTIN 100 MG PO CAPS: 100.0000 mg | ORAL_CAPSULE | Freq: Three times a day (TID) | ORAL | Status: DC

## 2024-01-31 MED ORDER — TRAZODONE HCL 100 MG PO TABS: 100.0000 mg | ORAL_TABLET | Freq: Every evening | ORAL | Status: DC | PRN

## 2024-01-31 MED ORDER — OLANZAPINE 10 MG IM SOLR: 10.0000 mg | Freq: Once | INTRAMUSCULAR | Status: DC | PRN

## 2024-01-31 MED ORDER — METOPROLOL SUCCINATE ER 50 MG PO TB24: 50.0000 mg | ORAL_TABLET | Freq: Every day | ORAL | Status: DC

## 2024-01-31 MED ORDER — RISPERIDONE 1 MG PO TABS
2.0000 mg | ORAL_TABLET | Freq: Every day | ORAL | Status: DC
  Administered 2024-01-31 – 2024-02-05 (×6): 2 mg via ORAL
  Filled 2024-01-31 (×6): qty 2

## 2024-01-31 MED ORDER — HYDROXYZINE HCL 25 MG PO TABS
25.0000 mg | ORAL_TABLET | Freq: Three times a day (TID) | ORAL | Status: DC | PRN
Start: 2024-01-31 — End: 2024-02-06

## 2024-01-31 MED ORDER — HALOPERIDOL 5 MG PO TABS: 5.0000 mg | ORAL_TABLET | Freq: Three times a day (TID) | ORAL | Status: DC | PRN

## 2024-01-31 MED ORDER — LORAZEPAM 2 MG/ML IJ SOLN: 2.0000 mg | Freq: Three times a day (TID) | INTRAMUSCULAR | Status: DC | PRN

## 2024-01-31 MED ORDER — FLUOXETINE HCL 20 MG PO CAPS
40.0000 mg | ORAL_CAPSULE | Freq: Every day | ORAL | Status: DC
  Administered 2024-01-31: 40 mg via ORAL
  Filled 2024-01-31: qty 2

## 2024-01-31 MED ORDER — MAGNESIUM HYDROXIDE 400 MG/5ML PO SUSP: 30.0000 mL | Freq: Every day | ORAL | Status: DC | PRN

## 2024-01-31 NOTE — Tx Team (Signed)
 Initial Treatment Plan 01/31/2024 4:27 PM David Salinas OZD:[id number]    PATIENT STRESSORS: Other: per patient, "my sexual urges have gotten more intense and my thoughts have gotten more violent".     PATIENT STRENGTHS: Ability for insight  General fund of knowledge  Motivation for treatment/growth  Supportive family/friends    PATIENT IDENTIFIED PROBLEMS: Anxiety  Sexual urges and thoughts to rape women and children                   DISCHARGE CRITERIA:  Ability to meet basic life and health needs Improved stabilization in mood, thinking, and/or behavior Need for constant or close observation no longer present Reduction of life-threatening or endangering symptoms to within safe limits  PRELIMINARY DISCHARGE PLAN: Outpatient therapy Return to previous living arrangement  PATIENT/FAMILY INVOLVEMENT: This treatment plan has been presented to and reviewed with the patient, David Salinas. The patient has been given the opportunity to ask questions and make suggestions.  Karalynn Cottone, RN 01/31/2024, 4:27 PM

## 2024-01-31 NOTE — ED Notes (Signed)
 Pt given breakfast tray

## 2024-01-31 NOTE — Plan of Care (Signed)
 New patient.  Problem: Education: Goal: Knowledge of [facility] Education information/materials will improve 01/31/2024 1604 by Doyce Para, RN Outcome: Not Progressing 01/31/2024 1604 by Doyce Para, RN Outcome: Progressing Goal: Emotional status will improve 01/31/2024 1604 by Doyce Para, RN Outcome: Not Progressing 01/31/2024 1604 by Doyce Para, RN Outcome: Progressing Goal: Mental status will improve 01/31/2024 1604 by Doyce Para, RN Outcome: Not Progressing 01/31/2024 1604 by Doyce Para, RN Outcome: Progressing Goal: Verbalization of understanding the information provided will improve 01/31/2024 1604 by Doyce Para, RN Outcome: Not Progressing 01/31/2024 1604 by Doyce Para, RN Outcome: Progressing   Problem: Activity: Goal: Interest or engagement in activities will improve 01/31/2024 1604 by Doyce Para, RN Outcome: Not Progressing 01/31/2024 1604 by Doyce Para, RN Outcome: Progressing Goal: Sleeping patterns will improve 01/31/2024 1604 by Doyce Para, RN Outcome: Not Progressing 01/31/2024 1604 by Doyce Para, RN Outcome: Progressing   Problem: Coping: Goal: Ability to verbalize frustrations and anger appropriately will improve 01/31/2024 1604 by Doyce Para, RN Outcome: Not Progressing 01/31/2024 1604 by Doyce Para, RN Outcome: Progressing Goal: Ability to demonstrate self-control will improve 01/31/2024 1604 by Doyce Para, RN Outcome: Not Progressing 01/31/2024 1604 by Doyce Para, RN Outcome: Progressing   Problem: Health Behavior/Discharge Planning: Goal: Identification of resources available to assist in meeting health care needs will improve 01/31/2024 1604 by Doyce Para, RN Outcome: Not Progressing 01/31/2024 1604 by Doyce Para, RN Outcome: Progressing Goal: Compliance with treatment plan for underlying cause of condition  will improve 01/31/2024 1604 by Doyce Para, RN Outcome: Not Progressing 01/31/2024 1604 by Doyce Para, RN Outcome: Progressing   Problem: Physical Regulation: Goal: Ability to maintain clinical measurements within normal limits will improve 01/31/2024 1604 by Doyce Para, RN Outcome: Not Progressing 01/31/2024 1604 by Doyce Para, RN Outcome: Progressing   Problem: Safety: Goal: Periods of time without injury will increase 01/31/2024 1604 by Doyce Para, RN Outcome: Not Progressing 01/31/2024 1604 by Doyce Para, RN Outcome: Progressing   Problem: Education: Goal: Knowledge of [place] [facility] Education information/materials will improve 01/31/2024 1604 by Doyce Para, RN Outcome: Not Progressing 01/31/2024 1604 by Doyce Para, RN Outcome: Progressing Goal: Emotional status will improve 01/31/2024 1604 by Doyce Para, RN Outcome: Not Progressing 01/31/2024 1604 by Doyce Para, RN Outcome: Progressing Goal: Mental status will improve 01/31/2024 1604 by Doyce Para, RN Outcome: Not Progressing 01/31/2024 1604 by Doyce Para, RN Outcome: Progressing Goal: Verbalization of understanding the information provided will improve 01/31/2024 1604 by Doyce Para, RN Outcome: Not Progressing 01/31/2024 1604 by Doyce Para, RN Outcome: Progressing

## 2024-01-31 NOTE — BH Assessment (Signed)
 Per [facility] AC Selena Batten), patient to be referred out of system.  Referral information for Psychiatric Hospitalization faxed to:  [facility] [phone]) No appropriate beds available.  [facility] [phone]),  [place] [phone]--- [phone]--- [phone]--- [phone])  [address] [phone]),   [facility] [phone] -or- [phone]),   [facility] [phone]),  [place] [phone])  [facility] [phone])

## 2024-01-31 NOTE — ED Provider Notes (Signed)
 Emergency Medicine Observation Re-evaluation Note  David Salinas is a 33 y.o. male, seen on rounds today.  Pt initially presented to the ED for complaints of No chief complaint on file.  Currently, the patient is is no acute distress. Denies any concerns at this time.  Physical Exam  Blood pressure (!) 140/97, pulse 73, temperature 98 F (36.7 C), temperature source Oral, resp. rate 16, weight 88.5 kg, SpO2 98%.  Physical Exam: General: No apparent distress Pulm: Normal WOB Neuro: Moving all extremities Psych: Resting comfortably     ED Course / MDM     I have reviewed the labs performed to date as well as medications administered while in observation.  Recent changes in the last 24 hours include: No acute events overnight.  Plan   Current plan: Patient awaiting psychiatric disposition. Patient is under full IVC at this time.    Adilene Areola, Layla Maw, DO 01/31/24 [phone]

## 2024-01-31 NOTE — ED Notes (Signed)
 Pt transferred to [facility] with security and IVC paperwork

## 2024-01-31 NOTE — BHH Group Notes (Signed)
 BHH Group Notes:  (Nursing/MHT/Case Management/Adjunct)  Date:  01/31/2024  Time:  11:12 PM  Type of Therapy:  Group Therapy  Participation Level:  Pt. Did not attend  Participation Quality:  Pt did not attend  Affect:  Pt. did not attend  Cognitive:  Pt. did not attend  Insight:  Pt. did not attend  Engagement in Group:  Pt. did not attend  Modes of Intervention:  Discussion  Summary of Progress/Problems: Wrapup group about "Anger Management"  Tacy Dura 01/31/2024, 11:12 PM

## 2024-01-31 NOTE — Progress Notes (Signed)
   01/31/24 1600  Psych Admission Type (Psych Patients Only)  Admission Status Involuntary  Psychosocial Assessment  Patient Complaints Anxiety (patient states "a little bit, sometimes I get adrenaline rushes and it causes me to get anxious".)  Eye Contact Fair;Watchful  Facial Expression Anxious  Affect Anxious  Speech Logical/coherent  Interaction Assertive  Motor Activity Slow  Appearance/Hygiene In scrubs  Behavior Characteristics Cooperative;Appropriate to situation  Mood Pleasant  Aggressive Behavior  Effect No apparent injury  Thought Process  Coherency WDL  Content WDL  Delusions None reported or observed  Perception WDL  Hallucination None reported or observed  Judgment WDL  Confusion None  Danger to Self  Current suicidal ideation? Denies  Danger to Others  Danger to Others None reported or observed

## 2024-01-31 NOTE — ED Notes (Signed)
 Pt dad called for update OK per Pt to tell Dad he moved to BMU unit. Dad notified.

## 2024-01-31 NOTE — ED Notes (Signed)
 PT IVC /PENDING PLACEMENT/ MEETS THE CRITERIA FOR INPATIENT HOSPITALIZATION WHEN MEDICALLY CLEARED.

## 2024-01-31 NOTE — ED Notes (Signed)
 Pt given lunch tray.

## 2024-01-31 NOTE — BH Assessment (Signed)
 Patient is to be admitted to [facility] BMU by [facility].  Attending Physician will be Dr.  Earmon Phoenix .   Patient has been assigned to room 309, by [facility] Charge Nurse Demetria.   Intake Paper Work has been signed and placed on patient chart.   ER staff is aware of the admission: Ronnie, ER Secretary   Dr. Roxan Hockey, ER MD  Merrie Roof, Patient's Nurse  Purnell Shoemaker, Patient Access.

## 2024-01-31 NOTE — Progress Notes (Signed)
   01/31/24 1500  Charting Type  Charting Type Admission  Safety Check Verification  Has the RN verified the 15 minute safety check completion? Yes  Neurological  Neuro (WDL) WDL  HEENT  HEENT (WDL) WDL  Respiratory  Respiratory (WDL) X (hx. Asthma)  Cardiac  Cardiac (WDL) WDL (hx. HTN)  Vascular  Vascular (WDL) WDL  Integumentary  Integumentary (WDL) X  Staff Member Assisting with Skin Assessment on Admission Tresa Endo, RN  Skin Color Appropriate for ethnicity  Skin Condition Dry;Flaky  Skin Integrity Intact;Erythema/redness;Other (Comment) (red/rasied areas to back)  Erythema/Redness Location Arm  Erythema/Redness Location Orientation Right (patient states this is from when they were drawing blood)  Skin Turgor Non-tenting  Braden Scale (Ages 8 and up)  Sensory Perceptions 4  Moisture 4  Activity 4  Mobility 4  Nutrition 3  Friction and Shear 3  Braden Scale Score 22  Musculoskeletal  Musculoskeletal (WDL) WDL  Gastrointestinal  Gastrointestinal (WDL) X  Last BM Date  01/28/24  GU Assessment  Genitourinary (WDL) WDL  Genitalia  Male Genitalia Intact  Neurological  Level of Consciousness Alert

## 2024-02-01 ENCOUNTER — Encounter: Payer: Self-pay | Admitting: Adult Health

## 2024-02-01 LAB — CBC WITH DIFFERENTIAL/PLATELET
Abs Immature Granulocytes: 0.02 K/uL (ref 0.00–0.07)
Basophils Absolute: 0.1 K/uL (ref 0.0–0.1)
Basophils Relative: 1 %
Eosinophils Absolute: 0.2 K/uL (ref 0.0–0.5)
Eosinophils Relative: 2 %
HCT: 44.9 % (ref 39.0–52.0)
Hemoglobin: 15.1 g/dL (ref 13.0–17.0)
Immature Granulocytes: 0 %
Lymphocytes Relative: 28 %
Lymphs Abs: 3 K/uL (ref 0.7–4.0)
MCH: 28.7 pg (ref 26.0–34.0)
MCHC: 33.6 g/dL (ref 30.0–36.0)
MCV: 85.4 fL (ref 80.0–100.0)
Monocytes Absolute: 0.8 K/uL (ref 0.1–1.0)
Monocytes Relative: 7 %
Neutro Abs: 6.8 K/uL (ref 1.7–7.7)
Neutrophils Relative %: 62 %
Platelets: 368 K/uL (ref 150–400)
RBC: 5.26 MIL/uL (ref 4.22–5.81)
RDW: 12.4 % (ref 11.5–15.5)
WBC: 10.8 K/uL — ABNORMAL HIGH (ref 4.0–10.5)
nRBC: 0 % (ref 0.0–0.2)

## 2024-02-01 LAB — COMPREHENSIVE METABOLIC PANEL WITH GFR
ALT: 28 U/L (ref 0–44)
AST: 18 U/L (ref 15–41)
Albumin: 4.4 g/dL (ref 3.5–5.0)
Alkaline Phosphatase: 55 U/L (ref 38–126)
Anion gap: 9 (ref 5–15)
BUN: 20 mg/dL (ref 6–20)
CO2: 23 mmol/L (ref 22–32)
Calcium: 9.7 mg/dL (ref 8.9–10.3)
Chloride: 103 mmol/L (ref 98–111)
Creatinine, Ser: 1.21 mg/dL (ref 0.61–1.24)
GFR, Estimated: >60 mL/min
Glucose, Bld: 96 mg/dL (ref 70–99)
Potassium: 4.2 mmol/L (ref 3.5–5.1)
Sodium: 135 mmol/L (ref 135–145)
Total Bilirubin: 0.7 mg/dL (ref 0.0–1.2)
Total Protein: 8 g/dL (ref 6.5–8.1)

## 2024-02-01 LAB — TSH: TSH: 6.176 u[IU]/mL — ABNORMAL HIGH (ref 0.350–4.500)

## 2024-02-01 MED ORDER — NICOTINE POLACRILEX 2 MG MT GUM: 2.0000 mg | CHEWING_GUM | OROMUCOSAL | Status: DC | PRN

## 2024-02-01 MED ORDER — FLUOXETINE HCL 20 MG PO CAPS
60.0000 mg | ORAL_CAPSULE | Freq: Every day | ORAL | Status: DC
  Administered 2024-02-02 – 2024-02-06 (×5): 60 mg via ORAL
  Filled 2024-02-01 (×5): qty 3

## 2024-02-01 NOTE — Plan of Care (Signed)
 David Salinas is a 33 y.o. male patient. No diagnosis found. Past Medical History:  Diagnosis Date   Anxiety    Asthma    Chronic bronchitis (HCC)    Panic attacks    Current Facility-Administered Medications  Medication Dose Route Frequency Provider Last Rate Last Admin   acetaminophen (TYLENOL) tablet 650 mg  650 mg Oral Q6H PRN Chales Abrahams, NP       alum & mag hydroxide-simeth (MAALOX/MYLANTA) 200-200-20 MG/5ML suspension 30 mL  30 mL Oral Q4H PRN Ophelia Shoulder E, NP       haloperidol (HALDOL) tablet 5 mg  5 mg Oral TID PRN Chales Abrahams, NP       And   diphenhydrAMINE (BENADRYL) capsule 50 mg  50 mg Oral TID PRN Ophelia Shoulder E, NP       haloperidol lactate (HALDOL) injection 5 mg  5 mg Intramuscular TID PRN Chales Abrahams, NP       And   diphenhydrAMINE (BENADRYL) injection 50 mg  50 mg Intramuscular TID PRN Chales Abrahams, NP       And   LORazepam (ATIVAN) injection 2 mg  2 mg Intramuscular TID PRN Ophelia Shoulder E, NP       haloperidol lactate (HALDOL) injection 10 mg  10 mg Intramuscular TID PRN Chales Abrahams, NP       And   diphenhydrAMINE (BENADRYL) injection 50 mg  50 mg Intramuscular TID PRN Chales Abrahams, NP       And   LORazepam (ATIVAN) injection 2 mg  2 mg Intramuscular TID PRN Chales Abrahams, NP       FLUoxetine (PROZAC) capsule 40 mg  40 mg Oral Daily Ophelia Shoulder E, NP       gabapentin (NEURONTIN) capsule 100 mg  100 mg Oral TID Ophelia Shoulder E, NP   100 mg at 01/31/24 1702   hydrOXYzine (ATARAX) tablet 25 mg  25 mg Oral TID PRN Chales Abrahams, NP       losartan (COZAAR) tablet 100 mg  100 mg Oral QPC breakfast Ophelia Shoulder E, NP       magnesium hydroxide (MILK OF MAGNESIA) suspension 30 mL  30 mL Oral Daily PRN Ophelia Shoulder E, NP       metoprolol succinate (TOPROL-XL) 24 hr tablet 50 mg  50 mg Oral Daily Ophelia Shoulder E, NP   50 mg at 01/31/24 1702   risperiDONE (RISPERDAL) tablet 2 mg  2 mg Oral QHS Ophelia Shoulder E, NP   2 mg at 01/31/24 2139    traZODone (DESYREL) tablet 100 mg  100 mg Oral QHS PRN Chales Abrahams, NP       Allergies  Allergen Reactions   Codeine     Heart racing      Penicillins     Anaphylaxis    Principal Problem:   Mood disorder (HCC)  Blood pressure 107/71, pulse 73, temperature 97.7 F (36.5 C), temperature source Oral, resp. rate 18, height 5\' 6"  (1.676 m), weight 90.7 kg, SpO2 95%.   Assessment & Plan: Pt. Denies SI/HI . Continue to monitor pt. Compliant with med this shift  Aurea Aronov B Jandi Swiger 02/01/2024

## 2024-02-01 NOTE — H&P (Cosign Needed Addendum)
 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 UDS negative, BAL <10  Pt is seen today for evaluation, reports that he was admitted because "my sexual urges have gotten more intense" over the last "few months". States that he has been having thoughts of wanting to rape wome, children of any gender, and dead bodies since he was about 33 or 33 yrs old. That he has these thoughts daily, "they don't bother me, I'm okay with them". He denies raping anyone in the past. At times he does want to act on these thoughts but he does not. Reports that the thought of doing these things "it brings me pleasure... I'm not sure if it's the force or the pain [caused to the victim]... A dead body I just find attractive... I find the skin to be attractive, even if the skin has some slight decomposition I get turned on". He denies there being any triggers, denies any recent medication changes. Denies any hx of abuse. States that he is compliant with his medications. No longer on Risperdal Consta as his outpatient psychiatrist "Dr. Stockton Sink" did not think this was necessary. Relays that Paxil was also discontinued by outpatient provider d/t s/e of diarrhea.  He denies any history of mania/hypomania.  Denies any depression, anxiety, difficulty sleeping, changes in appetite, energy levels, significant anxiety.  Denies SI/HI and AVH.  Alert and oriented x 4.  Affect is blunted. He currently has pending charges for  child pornography. No access to firearms/weapons. No spiritual beliefs.  Reports hx of cocaine and alcohol abuse, but has been sober x7 yrs. Did complete a rehab program at the time "[facility] in [place]". Denies hx of past trauma.  Pt lives at home with his grandparents, unemployed, no disability benefits. Separated, no children, completed the 10th grade.   Cr 1.40, WBC 14.9 EKG sinus bradycardia QTC 381   Associated Signs/Symptoms: Depression Symptoms:   none (Hypo) Manic Symptoms:   sexually inappropriate thoughts Anxiety Symptoms:  Social Anxiety, Psychotic Symptoms:   none PTSD Symptoms: NA Total Time spent with patient: 1.5 hours  Past Psychiatric History:  DX: MDD with psychosis Outpatient provider: [facility] Current caregiver:  Patient is own guardian/ care giver Past hospitalizations:  [facility] 2021 and 10/2023-11/2023, per chart review, admitted to [facility] Inpatient [facility]iatry 11/02/2020- 11/08/2023 for MDD with psychotic features, SI, and HI. HPI states patient endorsed worsening suicidal ideations with plan to hang himself, homicidal ideations towards his neighbor, and increasing intrusive thoughts of sexually violent acts. Endorsed commanding auditory hallucinations to act on these impulses. Discharged on Prozac 40mg  po daily, Paxil 10mg  po daily, Risperdal 2mg  at bedtime, and Trazodone 100mg  at bedtime PRN.  Medication trials : Risperdal, Risperdal consta LAI, prozac, paxil, trazodone  Suicide attempts: x2; reports that at 33 yrs old he cut his wrist and was hospitalized at a facility in [place], and once again at 62 he attempted to strangle himself with a belt, no hospitalization for this  CST team: yes  per pt  Patient denies ever having an Act team. Denies ECT, Clozaril treatments.   Is the patient at risk to self? No.  Has the patient been a risk to self in the past 6 months? No.  Has the patient been a risk to self within the distant past?  Yes.    Is the patient a risk to others? Yes.    Has the patient been a risk to others in the past 6 months? Yes.    Has the patient been a risk to others within the distant past? Yes.     [place] Scale:  Flowsheet Row Admission (Current) from 01/31/2024 in [facility] INPATIENT BEHAVIORAL MEDICINE ED from 01/30/2024 in [facility] Emergency Department at [facility] Admission (Discharged) from 11/03/2023 in [facility] INPATIENT BEHAVIORAL MEDICINE  C-SSRS RISK CATEGORY No Risk No Risk High Risk        Prior Inpatient Therapy: Yes.   If yes, describe see above  Prior Outpatient Therapy: Yes.   If yes, describe see above   Alcohol Screening: 1. How often do you have a drink containing alcohol?: Never 2. How many drinks containing alcohol do you have on a typical day when you are drinking?: 1 or 2 3. How often do you have six or more drinks on one occasion?: Never AUDIT-C Score: 0 4. How often during the last year have you found that you were not able to stop drinking once you had started?: Never 5. How often during the last year have you failed to do what was normally expected from you because of drinking?: Never 6. How often during the last year have you needed a first drink in the morning to get yourself going after a heavy drinking session?: Never 7. How often during the last year have you had a feeling of guilt of remorse after drinking?: Never 8. How often during the last year have you been unable to remember what happened the night before because you had been drinking?: Never 9. Have you or someone else been injured as a result of your drinking?: No 10. Has a relative or friend or a doctor or another health worker been concerned about your drinking or suggested you cut down?: No Alcohol Use Disorder Identification Test Final Score (AUDIT): 0 Alcohol Brief Interventions/Follow-up: Alcohol education/Brief advice Substance Abuse History in the last 12 months:  No. Consequences of Substance  Abuse: Negative Previous Psychotropic Medications: Yes  Psychological Evaluations: Yes  Past Medical History:  Past Medical History:  Diagnosis Date   Anxiety    Asthma    Chronic bronchitis (HCC)    Hypertension    Panic attacks     Past Surgical History:  Procedure Laterality Date   TONSILLECTOMY     Family History:  Family History  Problem Relation Age of Onset   Anxiety disorder Mother    Depression Mother    Hypertension Mother    Diabetes Mother    Hypothyroidism Mother    Anxiety disorder Father    Hypertension Father    Hyperlipidemia Father    Lymphoma Maternal Grandfather    Family Psychiatric  History:  Psych dx: pt reports that two of his paternal uncles have unspecified mental illness; per chart review, mother - depression, anxiety/father - anxiety Suicide: pt denies SUD: pt denies   Tobacco Screening:  Social History   Tobacco Use  Smoking Status Former   Current packs/day: 0.50   Types: Cigarettes  Smokeless Tobacco Never    BH Tobacco Counseling  Are you interested in Tobacco Cessation Medications?  N/A, patient does not use tobacco products Counseled patient on smoking cessation:  N/A, patient does not use tobacco products Reason Tobacco Screening Not Completed: No value filed.       Social History:  Social History   Substance and Sexual Activity  Alcohol Use Not Currently     Social History   Substance and Sexual Activity  Drug Use No    Additional Social History:                           Allergies:   Allergies  Allergen Reactions   Codeine     Heart racing      Penicillins     Anaphylaxis    Lab Results:  Results for orders placed or performed during the hospital encounter of 01/30/24 (from the past 48 hours)  Comprehensive metabolic panel     Status: Abnormal   Collection Time: 01/30/24  6:51 PM  Result Value Ref Range   Sodium 134 (L) 135 - 145 mmol/L   Potassium 3.6 3.5 - 5.1 mmol/L   Chloride 102  98 - 111 mmol/L   CO2 23 22 - 32 mmol/L   Glucose, Bld 126 (H) 70 - 99 mg/dL    Comment: Glucose reference range applies only to samples taken after fasting for at least 8 hours.   BUN 17 6 - 20 mg/dL   Creatinine, Ser 1.61 (H) 0.61 - 1.24 mg/dL   Calcium 9.5 8.9 - 09.6 mg/dL   Total Protein 7.8 6.5 - 8.1 g/dL   Albumin 4.6 3.5 - 5.0 g/dL   AST 18 15 - 41 U/L   ALT 30 0 - 44 U/L   Alkaline Phosphatase 51 38 - 126 U/L   Total Bilirubin 0.6 0.0 - 1.2 mg/dL   GFR, Estimated >04 >54 mL/min    Comment: (NOTE) Calculated using the CKD-EPI Creatinine Equation (2021)    Anion gap 9 5 - 15    Comment: Performed at [facility], [address] [place], [place] [address]  Ethanol     Status: None   Collection Time: 01/30/24  6:51 PM  Result Value Ref Range   Alcohol, Ethyl (B) <10 <10 mg/dL    Comment: (NOTE) Lowest detectable limit for serum alcohol is 10 mg/dL.  For medical purposes only. Performed at [facility], [address] [place], [place] [address]   Salicylate level     Status: Abnormal   Collection Time: 01/30/24  6:51 PM  Result Value Ref Range   Salicylate Lvl <7.0 (L) 7.0 - 30.0 mg/dL    Comment: Performed at [facility], [address] [place], [place] [address]  Acetaminophen level     Status: Abnormal   Collection Time: 01/30/24  6:51 PM  Result Value Ref Range   Acetaminophen (Tylenol), Serum <10 (L) 10 - 30 ug/mL    Comment: (NOTE) Therapeutic concentrations vary significantly. A range of 10-30 ug/mL  may be an effective concentration for many patients. However, some  are best treated at concentrations outside of this range. Acetaminophen concentrations >150 ug/mL at 4 hours after ingestion  and >50 ug/mL at 12 hours after ingestion are often associated with  toxic reactions.  Performed at [facility], [address] [place], [place] [address]   CBC     Status: Abnormal   Collection Time: 01/30/24  6:51 PM   Result Value Ref  Range   WBC 14.9 (H) 4.0 - 10.5 K/uL   RBC 5.10 4.22 - 5.81 MIL/uL   Hemoglobin 15.1 13.0 - 17.0 g/dL   HCT 86.5 78.4 - 69.6 %   MCV 84.9 80.0 - 100.0 fL   MCH 29.6 26.0 - 34.0 pg   MCHC 34.9 30.0 - 36.0 g/dL   RDW 29.5 28.4 - 13.2 %   Platelets 371 150 - 400 K/uL   nRBC 0.0 0.0 - 0.2 %    Comment: Performed at [facility], [address] [place], [place] [address]  Urine Drug Screen, Qualitative     Status: None   Collection Time: 01/30/24  7:00 PM  Result Value Ref Range   Tricyclic, Ur Screen NONE DETECTED NONE DETECTED   Amphetamines, Ur Screen NONE DETECTED NONE DETECTED   MDMA (Ecstasy)Ur Screen NONE DETECTED NONE DETECTED   Cocaine Metabolite,Ur [place] NONE DETECTED NONE DETECTED   Opiate, Ur Screen NONE DETECTED NONE DETECTED   Phencyclidine (PCP) Ur S NONE DETECTED NONE DETECTED   Cannabinoid 50 Ng, Ur [place] NONE DETECTED NONE DETECTED   Barbiturates, Ur Screen NONE DETECTED NONE DETECTED   Benzodiazepine, Ur Scrn NONE DETECTED NONE DETECTED   Methadone Scn, Ur NONE DETECTED NONE DETECTED    Comment: (NOTE) Tricyclics + metabolites, urine    Cutoff 1000 ng/mL Amphetamines + metabolites, urine  Cutoff 1000 ng/mL MDMA (Ecstasy), urine              Cutoff 500 ng/mL Cocaine Metabolite, urine          Cutoff 300 ng/mL Opiate + metabolites, urine        Cutoff 300 ng/mL Phencyclidine (PCP), urine         Cutoff 25 ng/mL Cannabinoid, urine                 Cutoff 50 ng/mL Barbiturates + metabolites, urine  Cutoff 200 ng/mL Benzodiazepine, urine              Cutoff 200 ng/mL Methadone, urine                   Cutoff 300 ng/mL  The urine drug screen provides only a preliminary, unconfirmed analytical test result and should not be used for non-medical purposes. Clinical consideration and professional judgment should be applied to any positive drug screen result due to possible interfering substances. A more specific alternate chemical method must be  used in order to obtain a confirmed analytical result. Gas chromatography / mass spectrometry (GC/MS) is the preferred confirm atory method. Performed at [facility], [address] [place], [place] [address]     Blood Alcohol level:  Lab Results  Component Value Date   [facility] <10 01/30/2024   ETH <10 11/02/2023    Metabolic Disorder Labs:  Lab Results  Component Value Date   HGBA1C 5.9 (H) 09/27/2023   MPG 122.63 09/27/2023   MPG 100 03/16/2009   No results found for: "PROLACTIN" Lab Results  Component Value Date   CHOL 200 09/26/2023   TRIG 118 09/26/2023   HDL 23 (L) 09/26/2023   CHOLHDL 8.7 09/26/2023   VLDL 24 09/26/2023   LDLCALC 153 (H) 09/26/2023   LDLCALC (H) 03/16/2009    113        Total Cholesterol/HDL:CHD Risk Coronary Heart Disease Risk Table                     Men   Women  1/2  Average Risk   3.4   3.3  Average Risk       5.0   4.4  2 X Average Risk   9.6   7.1  3 X Average Risk  23.4   11.0        Use the calculated Patient Ratio above and the CHD Risk Table to determine the patient's CHD Risk.        ATP III CLASSIFICATION (LDL):  <100     mg/dL   Optimal  829-562  mg/dL   Near or Above                    Optimal  130-159  mg/dL   Borderline  130-865  mg/dL   High  >784     mg/dL   Very High    Current Medications: Current Facility-Administered Medications  Medication Dose Route Frequency Provider Last Rate Last Admin   acetaminophen (TYLENOL) tablet 650 mg  650 mg Oral Q6H PRN Everest Hacking, PA-C       alum & mag hydroxide-simeth (MAALOX/MYLANTA) 200-200-20 MG/5ML suspension 30 mL  30 mL Oral Q4H PRN Ithan Touhey, PA-C       haloperidol (HALDOL) tablet 5 mg  5 mg Oral TID PRN Walid Haig, PA-C       And   diphenhydrAMINE (BENADRYL) capsule 50 mg  50 mg Oral TID PRN Saquoia Sianez, PA-C       haloperidol lactate (HALDOL) injection 5 mg  5 mg Intramuscular TID PRN Lajoyce Tamura, PA-C       And    diphenhydrAMINE (BENADRYL) injection 50 mg  50 mg Intramuscular TID PRN Tyleek Smick, PA-C       And   LORazepam (ATIVAN) injection 2 mg  2 mg Intramuscular TID PRN Stephens Shreve, PA-C       haloperidol lactate (HALDOL) injection 10 mg  10 mg Intramuscular TID PRN Marlayna Bannister, PA-C       And   diphenhydrAMINE (BENADRYL) injection 50 mg  50 mg Intramuscular TID PRN Melina Mosteller, PA-C       And   LORazepam (ATIVAN) injection 2 mg  2 mg Intramuscular TID PRN Agapito Hanway, PA-C       FLUoxetine (PROZAC) capsule 40 mg  40 mg Oral Daily Lillis Nuttle, PA-C   40 mg at 02/01/24 0847   gabapentin (NEURONTIN) capsule 100 mg  100 mg Oral TID Brantleigh Mifflin, PA-C   100 mg at 02/01/24 1353   hydrOXYzine (ATARAX) tablet 25 mg  25 mg Oral TID PRN Rayden Scheper, PA-C       losartan (COZAAR) tablet 100 mg  100 mg Oral QPC breakfast Kayleann Mccaffery, PA-C   100 mg at 02/01/24 0847   magnesium hydroxide (MILK OF MAGNESIA) suspension 30 mL  30 mL Oral Daily PRN Haydyn Liddell, PA-C       metoprolol succinate (TOPROL-XL) 24 hr tablet 50 mg  50 mg Oral Daily Amirra Herling, PA-C   50 mg at 02/01/24 0846   risperiDONE (RISPERDAL) tablet 2 mg  2 mg Oral QHS Jearlean Demauro, PA-C   2 mg at 01/31/24 2139   traZODone (DESYREL) tablet 100 mg  100 mg Oral QHS PRN Jackalynn Art, PA-C       PTA Medications: Medications Prior to Admission  Medication Sig Dispense Refill Last Dose/Taking   albuterol (VENTOLIN HFA) 108 (90 Base) MCG/ACT inhaler Inhale 1-2 puffs into the lungs every 6 (six) hours as needed for wheezing or shortness  of breath. 1 each 0    atorvastatin (LIPITOR) 10 MG tablet Take 1 tablet (10 mg total) by mouth daily. 90 tablet 3    FLUoxetine (PROZAC) 40 MG capsule Take 1 capsule (40 mg total) by mouth daily. 30 capsule 0    gabapentin (NEURONTIN) 100 MG capsule Take 1 capsule (100 mg total) by mouth 3 (three) times daily. 90 capsule 0     losartan (COZAAR) 100 MG tablet Take 1 tablet (100 mg total) by mouth daily after breakfast. 90 tablet 3    metoprolol succinate (TOPROL-XL) 50 MG 24 hr tablet Take 1 tablet (50 mg total) by mouth daily. 90 tablet 3    PARoxetine (PAXIL) 10 MG tablet Take 1 tablet (10 mg total) by mouth daily. 30 tablet 0    risperiDONE (RISPERDAL) 2 MG tablet Take 1 tablet (2 mg total) by mouth at bedtime. 30 tablet 0    traZODone (DESYREL) 100 MG tablet Take 1 tablet (100 mg total) by mouth at bedtime as needed for sleep. 30 tablet 0     Musculoskeletal: Strength & Muscle Tone: within normal limits Gait & Station: normal Patient leans: N/A            Psychiatric Specialty Exam:  Presentation  General Appearance:  Appropriate for Environment  Eye Contact: Good  Speech: Clear and Coherent; Normal Rate  Speech Volume: Normal  Handedness: Left   Mood and Affect  Mood: Euthymic  Affect: Blunt   Thought Process  Thought Processes: Coherent; Linear  Duration of Psychotic Symptoms:N/A Past Diagnosis of Schizophrenia or Psychoactive disorder: No  Descriptions of Associations:Intact  Orientation:Full (Time, Place and Person)  Thought Content:WDL  Hallucinations:Hallucinations: None  Ideas of Reference:None  Suicidal Thoughts:Suicidal Thoughts: No  Homicidal Thoughts:Homicidal Thoughts: No   Sensorium  Memory: Immediate Good; Recent Good; Remote Good  Judgment: Fair  Insight: Poor   Executive Functions  Concentration: Good  Attention Span: Good  Recall: Good  Fund of Knowledge: Fair  Language: Fair   Psychomotor Activity  Psychomotor Activity: Psychomotor Activity: Normal   Assets  Assets: Manufacturing systems engineer; Housing; Health and safety inspector; Leisure Time; Social Support   Sleep  Sleep: Sleep: Good    Physical Exam: Physical Exam Vitals and nursing note reviewed.  Constitutional:      Appearance: Normal  appearance.  HENT:     Head: Normocephalic and atraumatic.  Eyes:     Extraocular Movements: Extraocular movements intact.  Pulmonary:     Effort: Pulmonary effort is normal.  Musculoskeletal:        General: Normal range of motion.     Cervical back: Normal range of motion.  Skin:    General: Skin is dry.  Neurological:     General: No focal deficit present.     Mental Status: He is alert and oriented to person, place, and time. Mental status is at baseline.  Psychiatric:        Attention and Perception: Attention and perception normal.        Mood and Affect: Mood is anxious. Affect is blunt.        Speech: Speech normal.        Behavior: Behavior normal. Behavior is cooperative.        Cognition and Memory: Cognition and memory normal.        Judgment: Judgment is inappropriate.     Comments: Ruminating thoughts Judgement poor    Review of Systems  Psychiatric/Behavioral:  The patient is nervous/anxious.   All other systems  reviewed and are negative.  Blood pressure 96/64, pulse (!) 53, temperature (!) 97.2 F (36.2 C), resp. rate 16, height 5\' 6"  (1.676 m), weight 90.7 kg, SpO2 99%. Body mass index is 32.28 kg/m.  Treatment Plan Summary: Daily contact with patient to assess and evaluate symptoms and progress in treatment and Medication management  Observation Level/Precautions:  15 minute checks  Laboratory:   RPR, HIV, TSH  Psychotherapy:    Medications:  Prozac, risperdal, trazodone  Consultations:    Discharge Concerns:    Estimated LOS: 3-5 days   Other:     Physician Treatment Plan for Primary Diagnosis: Mood disorder (HCC)  1.    Safety and Monitoring:   --  Involuntary admission to inpatient psychiatric unit for safety, stabilization and treatment -- Daily contact with patient to assess and evaluate symptoms and progress in treatment -- Patient's case to be discussed in multi-disciplinary team meeting -- Observation Level : q15 minute checks -- Vital  signs:  q12 hours -- Precautions: assault   2. Psychiatric Diagnoses and Treatment: Pt with hx of MDD with psychosis, reports ruminating thoughts of desire to rape children, and women, necrophilia. That these thoughts are always present, however do not cause distress. Has pending charges for child pornography. Current diagnosis of unspecified mood disorder, R/O Pedophilia disorder vs Sadism disorder   -- Increase Prozac 40 mg to 60 mg daily for mood/anxiety -- Continue Risperdal 2 mg HS for mood stabilization  -- Continue Trazodone 100 mg HS PRN insomnia  -- Labs ordered for RPR, HIV, TSH -- Cr and WBC ,mildly elevated, will repeat CBC and CMP to see if still trending upward.  -- Will recommend forensic testing at discharge  --  The risks/benefits/side-effects/alternatives to this medication were discussed in detail with the patient and time was given for questions. The patient consents to medication trial. -- Metabolic profile and EKG monitoring obtained while on an atypical antipsychotic  -- Encouraged patient to participate in unit milieu and in scheduled group therapies -- Short Term Goals: Ability to identify changes in lifestyle to reduce recurrence of condition will improve, Ability to verbalize feelings will improve, Ability to disclose and discuss suicidal ideas, Ability to demonstrate self-control will improve, Ability to identify and develop effective coping behaviors will improve, Ability to maintain clinical measurements within normal limits will improve, Compliance with prescribed medications will improve, and Ability to identify triggers associated with substance abuse/mental health issues will improve -- Long Term Goals: Improvement in symptoms so as ready for discharge        3. Medical Issues Being Addressed:               Hypertension  -- Continue home meds Metoprolol succinate 50 mg daily, losartan 100 mg daily                Hyperlipidemia             -- Restart  Atorvastatin 10 mg HS    4. Discharge Planning:   -- Social work and case management to assist with discharge planning and identification of hospital follow-up needs prior to discharge -- Estimated LOS: 3-5 days -- Discharge Concerns: Need to establish a safety plan; Medication compliance and effectiveness -- Discharge Goals: Return home with outpatient referrals for mental health follow-up including medication management/psychotherapy    I certify that inpatient services furnished can reasonably be expected to improve the patient's condition.    Daizee Firmin, PA-C 3/30/20252:17 PM
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
## 2024-02-01 NOTE — BHH Suicide Risk Assessment (Signed)
 BHH INPATIENT:  Family/Significant Other Suicide Prevention Education  Suicide Prevention Education:  Education Completed; sister Esau Grew [phone] ,  has been identified by the patient as the family member/significant other with whom the patient will be residing, and identified as the person(s) who will aid the patient in the event of a mental health crisis (suicidal ideations/suicide attempt).  With written consent from the patient, the family member/significant other has been provided the following suicide prevention education, prior to the and/or following the discharge of the patient.  The suicide prevention education provided includes the following: Suicide risk factors Suicide prevention and interventions National Suicide Hotline telephone number [facility] assessment telephone number [facility] Emergency Assistance 911 [facility] and/or Residential Mobile Crisis Unit telephone number  Request made of family/significant other to: Remove weapons (e.g., guns, rifles, knives), all items previously/currently identified as safety concern.   Remove drugs/medications (over-the-counter, prescriptions, illicit drugs), all items previously/currently identified as a safety concern.  The family member/significant other verbalizes understanding of the suicide prevention education information provided.  The family member/significant other agrees to remove the items of safety concern listed above.  The LCSWA contacted the patient sister to provide SPI. She stated that she is not that involved with her brother. He is staying with his grandparents. Sheriff setup safety plan with patient and his grandma is his accountability partner. She stated that the [facility] feels that it is best for him to be here because they are concerned about the safety of the community. They will like to be updated on the patient discharge. The patient has no access to guns or weapons.    Marshell Levan 02/01/2024, 5:26 PM

## 2024-02-01 NOTE — BHH Suicide Risk Assessment (Signed)
 Suicide Risk Assessment  Admission Assessment    [facility] Admission Suicide Risk Assessment   Nursing information obtained from:  Patient Demographic factors:  Male, Caucasian, Low socioeconomic status Current Mental Status:  NA Loss Factors:  NA Historical Factors:  NA Risk Reduction Factors:  Sense of responsibility to family  Total Time spent with patient: 1.5 hours Principal Problem: Mood disorder (HCC) Diagnosis:  Principal Problem:   Mood disorder (HCC)  Subjective Data:  33 year old separated Caucasian male with reported hx of MDD with psychosis, GAD, medical hx of HTN, HLD brought to [facility] ED by [facility] under IVC  petitioned by LCSW at [facility] due to concerns that he has been experiencing increased thoughts of raping women and children, necrophilia. UDS negative, BAL <10   Pt is seen today for evaluation, reports that he was admitted because "my sexual urges have gotten more intense" over the last "few months". States that he has been having thoughts of wanting to rape wome, children of any gender, and dead bodies since he was about 53 or 33 yrs old. That he has these thoughts daily, "they don't bother me, I'm okay with them". He denies raping anyone in the past. At times he does want to act on these thoughts but he does not. Reports that the thought of doing these things "it brings me pleasure... I'm not sure if it's the force or the pain [caused to the victim]... A dead body I just find attractive... I find the skin to be attractive, even if the skin has some slight decomposition I get turned on". He denies there being any triggers, denies any recent medication changes. Denies any hx of abuse. States that he is compliant with his medications. No longer on Risperdal Consta as his outpatient psychiatrist "Dr. Custer Sink" did not think this was necessary. Relays that Paxil was also discontinued by outpatient provider d/t s/e of diarrhea.  He denies any history of mania/hypomania.  Denies any depression,  anxiety, difficulty sleeping, changes in appetite, energy levels, significant anxiety.  Denies SI/HI and AVH.  Alert and oriented x 4.  Affect is blunted. He currently has pending charges for child pornography. No access to firearms/weapons. No spiritual beliefs.  Reports hx of cocaine and alcohol abuse, but has been sober x7 yrs. Did complete a rehab program at the time "[facility] in [place]". Denies hx of past trauma.  Pt lives at home with his grandparents, unemployed, no disability benefits. Separated, no children, completed the 10th grade.   Continued Clinical Symptoms:  Alcohol Use Disorder Identification Test Final Score (AUDIT): 0 The "Alcohol Use Disorders Identification Test", Guidelines for Use in Primary Care, Second Edition.  World Science writer [facility]). Score between 0-7:  no or low risk or alcohol related problems. Score between 8-15:  moderate risk of alcohol related problems. Score between 16-19:  high risk of alcohol related problems. Score 20 or above:  warrants further diagnostic evaluation for alcohol dependence and treatment.   CLINICAL FACTORS:   Previous Psychiatric Diagnoses and Treatments Medical Diagnoses and Treatments/Surgeries   Musculoskeletal: Strength & Muscle Tone: within normal limits Gait & Station: normal Patient leans: N/A  Psychiatric Specialty Exam:  Presentation  General Appearance:  Appropriate for Environment  Eye Contact: Good  Speech: Clear and Coherent; Normal Rate  Speech Volume: Normal  Handedness: Left   Mood and Affect  Mood: Euthymic  Affect: Blunt   Thought Process  Thought Processes: Coherent; Linear  Descriptions of Associations:Intact  Orientation:Full (Time, Place  and Person)  Thought Content:WDL  History of Schizophrenia/Schizoaffective disorder:No  Duration of Psychotic Symptoms:Less than six months  Hallucinations:Hallucinations: None  Ideas of Reference:None  Suicidal  Thoughts:Suicidal Thoughts: No  Homicidal Thoughts:Homicidal Thoughts: No   Sensorium  Memory: Immediate Good; Recent Good; Remote Good  Judgment: Fair  Insight: Poor   Executive Functions  Concentration: Good  Attention Span: Good  Recall: Good  Fund of Knowledge: Fair  Language: Fair   Psychomotor Activity  Psychomotor Activity: Psychomotor Activity: Normal   Assets  Assets: Manufacturing systems engineer; Housing; Health and safety inspector; Leisure Time; Social Support   Sleep  Sleep: Sleep: Good    Physical Exam: Physical Exam Vitals and nursing note reviewed.  Constitutional:      Appearance: Normal appearance.  HENT:     Head: Normocephalic and atraumatic.  Eyes:     Extraocular Movements: Extraocular movements intact.  Pulmonary:     Effort: Pulmonary effort is normal.  Musculoskeletal:        General: Normal range of motion.     Cervical back: Normal range of motion.  Skin:    General: Skin is dry.  Neurological:     General: No focal deficit present.     Mental Status: He is alert and oriented to person, place, and time. Mental status is at baseline.  Psychiatric:        Attention and Perception: Attention and perception normal.        Mood and Affect: Mood is anxious. Affect is blunt.        Speech: Speech normal.        Behavior: Behavior normal. Behavior is cooperative.        Cognition and Memory: Cognition and memory normal.        Judgment: Judgment is inappropriate.     Comments: Ruminating thoughts Judgement poor      Review of Systems  Psychiatric/Behavioral:  The patient is nervous/anxious.   All other systems reviewed and are negative.  Blood pressure 96/64, pulse (!) 53, temperature (!) 97.2 F (36.2 C), resp. rate 16, height 5\' 6"  (1.676 m), weight 90.7 kg, SpO2 99%. Body mass index is 32.28 kg/m.   COGNITIVE FEATURES THAT CONTRIBUTE TO RISK:  Thought constriction (tunnel vision)    SUICIDE RISK:   Minimal: No  identifiable suicidal ideation.  Patients presenting with no risk factors but with morbid ruminations; may be classified as minimal risk based on the severity of the depressive symptoms  PLAN OF CARE:  Crisis stabilization, psychiatric evaluation, therapeutic milieu, group participation, medication management for ruminating thoughts, anxiety, and development of an individualized safety plan.  I certify that inpatient services furnished can reasonably be expected to improve the patient's condition.   Nikayla Madaris, PA-C 02/01/2024, 2:53 PM

## 2024-02-01 NOTE — Progress Notes (Signed)
   02/01/24 1000  Psych Admission Type (Psych Patients Only)  Admission Status Involuntary  Psychosocial Assessment  Patient Complaints Anxiety  Eye Contact Fair  Facial Expression Anxious  Affect Anxious  Speech Logical/coherent  Interaction Assertive  Motor Activity Slow  Appearance/Hygiene In scrubs  Behavior Characteristics Cooperative  Mood Pleasant  Thought Process  Coherency WDL  Content WDL  Delusions None reported or observed  Perception WDL  Hallucination None reported or observed  Judgment WDL  Confusion None  Danger to Self  Current suicidal ideation? Denies  Agreement Not to Harm Self Yes  Description of Agreement verbal  Danger to Others  Danger to Others None reported or observed

## 2024-02-01 NOTE — Plan of Care (Signed)
   Problem: Education: Goal: Knowledge of [facility] Education information/materials will improve Outcome: Progressing Goal: Emotional status will improve Outcome: Progressing Goal: Mental status will improve Outcome: Progressing Goal: Verbalization of understanding the information provided will improve Outcome: Progressing

## 2024-02-01 NOTE — Group Note (Signed)
 Date:  02/01/2024 Time:  10:24 AM  Group Topic/Focus:  Dimensions of Wellness:   The focus of this group is to introduce the topic of wellness and discuss the role each dimension of wellness plays in total health.    Participation Level:  Active  Participation Quality:  Appropriate  Affect:  Appropriate  Cognitive:  Appropriate  Insight: Appropriate  Engagement in Group:  Engaged  Modes of Intervention:  Activity  Additional Comments:    Lynelle Smoke Derek Laughter 02/01/2024, 10:24 AM

## 2024-02-01 NOTE — BHH Suicide Risk Assessment (Signed)
 BHH INPATIENT:  Family/Significant Other Suicide Prevention Education  Suicide Prevention Education:  Contact Attempts: sister Esau Grew [phone], (name of family member/significant other) has been identified by the patient as the family member/significant other with whom the patient will be residing, and identified as the person(s) who will aid the patient in the event of a mental health crisis.  With written consent from the patient, two attempts were made to provide suicide prevention education, prior to and/or following the patient's discharge.  We were unsuccessful in providing suicide prevention education.  A suicide education pamphlet was given to the patient to share with family/significant other.  Date and time of first attempt: 02/01/24 at 5:15 pm Date and time of second attempt:second attempt needed  Marshell Levan 02/01/2024, 5:16 PM

## 2024-02-01 NOTE — BHH Group Notes (Signed)
 BHH Group Notes:  (Nursing/MHT/Case Management/Adjunct)  Date:  02/01/2024  Time:  10:13 PM  Type of Therapy:  Group Therapy  Participation Level:  Active  Participation Quality:  Appropriate and Attentive  Affect:  Appropriate  Cognitive:  Alert and Appropriate  Insight:  Appropriate and Good  Engagement in Group:  Engaged  Modes of Intervention:  Discussion  Summary of Progress/Problems: Wrapup group "How was your day"  Tacy Dura 02/01/2024, 10:13 PM

## 2024-02-02 LAB — RPR: RPR Ser Ql: NONREACTIVE

## 2024-02-02 LAB — HIV ANTIBODY (ROUTINE TESTING W REFLEX): HIV Screen 4th Generation wRfx: NONREACTIVE

## 2024-02-02 NOTE — Group Note (Signed)
 Date:  02/02/2024 Time:  10:09 PM  Group Topic/Focus:  Wrap-Up Group:   The focus of this group is to help patients review their daily goal of treatment and discuss progress on daily workbooks.    Participation Level:  Minimal  Participation Quality:  Appropriate  Affect:  Appropriate  Cognitive:  Alert  Insight: Good  Engagement in Group:  Limited  Modes of Intervention:  Discussion and Orientation  Additional Comments:     Maglione,Nakiyah Beverley E 02/02/2024, 10:09 PM

## 2024-02-02 NOTE — Progress Notes (Signed)
   02/02/24 1100  Psych Admission Type (Psych Patients Only)  Admission Status Involuntary  Psychosocial Assessment  Patient Complaints Other (Comment) (" sexual urges")  Eye Contact Fair  Facial Expression Anxious  Affect Anxious  Speech Logical/coherent  Interaction Assertive  Motor Activity Slow  Appearance/Hygiene In scrubs  Behavior Characteristics Cooperative  Mood Pleasant  Thought Process  Coherency WDL  Content WDL  Delusions None reported or observed  Perception WDL  Hallucination None reported or observed  Judgment Impaired  Confusion None  Danger to Self  Current suicidal ideation? Denies  Agreement Not to Harm Self Yes  Description of Agreement verbal  Danger to Others  Danger to Others None reported or observed   Patient states that his goal for today is manage his sexual urges. Patient appropriate with staff & peers. Appetite and energy level good. ADLs maintained. Support and encouragement given.

## 2024-02-02 NOTE — Plan of Care (Signed)
  Problem: Health Behavior/Discharge Planning: Goal: Identification of resources available to assist in meeting health care needs will improve Outcome: Progressing   Problem: Health Behavior/Discharge Planning: Goal: Identification of resources available to assist in meeting health care needs will improve Outcome: Progressing   Problem: Coping: Goal: Ability to demonstrate self-control will improve Outcome: Progressing   Problem: Coping: Goal: Ability to verbalize frustrations and anger appropriately will improve Outcome: Progressing   Problem: Activity: Goal: Interest or engagement in activities will improve Outcome: Progressing   Problem: Education: Goal: Mental status will improve Outcome: Progressing

## 2024-02-02 NOTE — Group Note (Signed)
 Date:  02/02/2024 Time:  5:01 PM  Group Topic/Focus:  Activity Group:  The focus of the group is to promote activity for the patients and encourage them to go outside in the courtyard for some fresh air and some exercise.   Participation Level:  Did Not Attend  Mary Sella Taneka Espiritu 02/02/2024, 5:01 PM

## 2024-02-02 NOTE — Plan of Care (Signed)
  Problem: Education: Goal: Knowledge of [facility] Education information/materials will improve Outcome: Progressing Goal: Emotional status will improve Outcome: Progressing Goal: Mental status will improve Outcome: Progressing Goal: Verbalization of understanding the information provided will improve Outcome: Progressing   Problem: Activity: Goal: Interest or engagement in activities will improve Outcome: Progressing Goal: Sleeping patterns will improve Outcome: Progressing   Problem: Coping: Goal: Ability to verbalize frustrations and anger appropriately will improve Outcome: Progressing Goal: Ability to demonstrate self-control will improve Outcome: Progressing   Problem: Health Behavior/Discharge Planning: Goal: Identification of resources available to assist in meeting health care needs will improve Outcome: Progressing Goal: Compliance with treatment plan for underlying cause of condition will improve Outcome: Progressing   Problem: Physical Regulation: Goal: Ability to maintain clinical measurements within normal limits will improve Outcome: Progressing   Problem: Safety: Goal: Periods of time without injury will increase Outcome: Progressing   Problem: Education: Goal: Knowledge of [facility] Education information/materials will improve Outcome: Progressing Goal: Emotional status will improve Outcome: Progressing Goal: Mental status will improve Outcome: Progressing Goal: Verbalization of understanding the information provided will improve Outcome: Progressing

## 2024-02-02 NOTE — BH IP Treatment Plan (Signed)
 Interdisciplinary Treatment and Diagnostic Plan Update  02/02/2024 Time of Session: 10:34 AM David Salinas MRN: [id number]  Principal Diagnosis: Mood disorder [facility])  Secondary Diagnoses: Principal Problem:   Mood disorder (HCC)   Current Medications:  Current Facility-Administered Medications  Medication Dose Route Frequency Provider Last Rate Last Admin   acetaminophen (TYLENOL) tablet 650 mg  650 mg Oral Q6H PRN Tingling, Stephanie, PA-C       alum & mag hydroxide-simeth (MAALOX/MYLANTA) 200-200-20 MG/5ML suspension 30 mL  30 mL Oral Q4H PRN Tingling, Stephanie, PA-C       haloperidol (HALDOL) tablet 5 mg  5 mg Oral TID PRN Tingling, Stephanie, PA-C       And   diphenhydrAMINE (BENADRYL) capsule 50 mg  50 mg Oral TID PRN Tingling, Stephanie, PA-C       haloperidol lactate (HALDOL) injection 5 mg  5 mg Intramuscular TID PRN Tingling, Stephanie, PA-C       And   diphenhydrAMINE (BENADRYL) injection 50 mg  50 mg Intramuscular TID PRN Tingling, Stephanie, PA-C       And   LORazepam (ATIVAN) injection 2 mg  2 mg Intramuscular TID PRN Tingling, Stephanie, PA-C       haloperidol lactate (HALDOL) injection 10 mg  10 mg Intramuscular TID PRN Tingling, Stephanie, PA-C       And   diphenhydrAMINE (BENADRYL) injection 50 mg  50 mg Intramuscular TID PRN Tingling, Stephanie, PA-C       And   LORazepam (ATIVAN) injection 2 mg  2 mg Intramuscular TID PRN Tingling, Stephanie, PA-C       FLUoxetine (PROZAC) capsule 60 mg  60 mg Oral Daily Tingling, Stephanie, PA-C   60 mg at 02/02/24 3664   gabapentin (NEURONTIN) capsule 100 mg  100 mg Oral TID Tingling, Stephanie, PA-C   100 mg at 02/02/24 4034   hydrOXYzine (ATARAX) tablet 25 mg  25 mg Oral TID PRN Tingling, Stephanie, PA-C       losartan (COZAAR) tablet 100 mg  100 mg Oral QPC breakfast Tingling, Stephanie, PA-C   100 mg at 02/02/24 7425   magnesium hydroxide (MILK OF MAGNESIA) suspension 30 mL  30 mL Oral Daily PRN Tingling, Stephanie, PA-C        metoprolol succinate (TOPROL-XL) 24 hr tablet 50 mg  50 mg Oral Daily Tingling, Stephanie, PA-C   50 mg at 02/02/24 9563   risperiDONE (RISPERDAL) tablet 2 mg  2 mg Oral QHS Tingling, Stephanie, PA-C   2 mg at 02/01/24 2113   traZODone (DESYREL) tablet 100 mg  100 mg Oral QHS PRN Tingling, Stephanie, PA-C   100 mg at 02/01/24 2113   PTA Medications: Medications Prior to Admission  Medication Sig Dispense Refill Last Dose/Taking   albuterol (VENTOLIN HFA) 108 (90 Base) MCG/ACT inhaler Inhale 1-2 puffs into the lungs every 6 (six) hours as needed for wheezing or shortness of breath. 1 each 0    atorvastatin (LIPITOR) 10 MG tablet Take 1 tablet (10 mg total) by mouth daily. 90 tablet 3    FLUoxetine (PROZAC) 40 MG capsule Take 1 capsule (40 mg total) by mouth daily. 30 capsule 0    gabapentin (NEURONTIN) 100 MG capsule Take 1 capsule (100 mg total) by mouth 3 (three) times daily. 90 capsule 0    losartan (COZAAR) 100 MG tablet Take 1 tablet (100 mg total) by mouth daily after breakfast. 90 tablet 3    metoprolol succinate (TOPROL-XL) 50 MG 24 hr tablet Take 1 tablet (  50 mg total) by mouth daily. 90 tablet 3    PARoxetine (PAXIL) 10 MG tablet Take 1 tablet (10 mg total) by mouth daily. 30 tablet 0    risperiDONE (RISPERDAL) 2 MG tablet Take 1 tablet (2 mg total) by mouth at bedtime. 30 tablet 0    traZODone (DESYREL) 100 MG tablet Take 1 tablet (100 mg total) by mouth at bedtime as needed for sleep. 30 tablet 0     Patient Stressors: Other: per patient, "my sexual urges have gotten more intense and my thoughts have gotten more violent".    Patient Strengths: Ability for insight  General fund of knowledge  Motivation for treatment/growth  Supportive family/friends   Treatment Modalities: Medication Management, Group therapy, Case management,  1 to 1 session with clinician, Psychoeducation, Recreational therapy.   Physician Treatment Plan for Primary Diagnosis: Mood disorder (HCC) Long  Term Goal(s):     Short Term Goals:    Medication Management: Evaluate patient's response, side effects, and tolerance of medication regimen.  Therapeutic Interventions: 1 to 1 sessions, Unit Group sessions and Medication administration.  Evaluation of Outcomes: Not Met  Physician Treatment Plan for Secondary Diagnosis: Principal Problem:   Mood disorder (HCC)  Long Term Goal(s):     Short Term Goals:       Medication Management: Evaluate patient's response, side effects, and tolerance of medication regimen.  Therapeutic Interventions: 1 to 1 sessions, Unit Group sessions and Medication administration.  Evaluation of Outcomes: Not Met   RN Treatment Plan for Primary Diagnosis: Mood disorder (HCC) Long Term Goal(s): Knowledge of disease and therapeutic regimen to maintain health will improve  Short Term Goals: Ability to remain free from injury will improve, Ability to verbalize frustration and anger appropriately will improve, Ability to demonstrate self-control, Ability to participate in decision making will improve, Ability to verbalize feelings will improve, Ability to disclose and discuss suicidal ideas, and Ability to identify and develop effective coping behaviors will improve  Medication Management: RN will administer medications as ordered by provider, will assess and evaluate patient's response and provide education to patient for prescribed medication. RN will report any adverse and/or side effects to prescribing provider.  Therapeutic Interventions: 1 on 1 counseling sessions, Psychoeducation, Medication administration, Evaluate responses to treatment, Monitor vital signs and CBGs as ordered, Perform/monitor CIWA, COWS, AIMS and Fall Risk screenings as ordered, Perform wound care treatments as ordered.  Evaluation of Outcomes: Not Met   LCSW Treatment Plan for Primary Diagnosis: Mood disorder (HCC) Long Term Goal(s): Safe transition to appropriate next level of care at  discharge, Engage patient in therapeutic group addressing interpersonal concerns.  Short Term Goals: Engage patient in aftercare planning with referrals and resources, Increase social support, Increase ability to appropriately verbalize feelings, Increase emotional regulation, Facilitate acceptance of mental health diagnosis and concerns, Facilitate patient progression through stages of change regarding substance use diagnoses and concerns, Identify triggers associated with mental health/substance abuse issues, and Increase skills for wellness and recovery  Therapeutic Interventions: Assess for all discharge needs, 1 to 1 time with Social worker, Explore available resources and support systems, Assess for adequacy in community support network, Educate family and significant other(s) on suicide prevention, Complete Psychosocial Assessment, Interpersonal group therapy.  Evaluation of Outcomes: Not Met   Progress in Treatment: Attending groups: Yes. and No. Participating in groups: Yes. and No. Taking medication as prescribed: Yes. Toleration medication: Yes. Family/Significant other contact made: Yes, individual(s) contacted:  Education Completed; sister Esau Grew [phone] ,  has been identified by the patient as the family member/significant other with whom the patient will be residing, and identified as the person(s) who will aid the patient in the event of a mental health crisis (suicidal ideations/suicide attempt).  With written consent from the patient, the family member/significant other has been provided the following suicide prevention education, prior to the and/or following the discharge of the patient. Patient understands diagnosis: Yes. Discussing patient identified problems/goals with staff: Yes. Medical problems stabilized or resolved: Yes. and No. Denies suicidal/homicidal ideation: Yes. Issues/concerns per patient self-inventory: No. Other: None  New problem(s)  identified: No, Describe:  None  New Short Term/Long Term Goal(s): detox, elimination of symptoms of psychosis, medication management for mood stabilization; elimination of SI thoughts; development of comprehensive mental wellness/sobriety plan.    Patient Goals:  "To get my sexual urges where I don't feel like acting on them as much."  Discharge Plan or Barriers: CSW to assist with the development of appropriate discharge plan.   Reason for Continuation of Hospitalization: Anxiety Delusions  Depression Hallucinations Mania Medication stabilization Suicidal ideation Other; describe Possible safety concerns.   Estimated Length of Stay:1-7 days.   Last 3 [place] Suicide Severity Risk Score: Flowsheet Row Admission (Current) from 01/31/2024 in [facility] INPATIENT BEHAVIORAL MEDICINE ED from 01/30/2024 in [facility] Emergency Department at [facility] Admission (Discharged) from 11/03/2023 in [facility] INPAT[facility] BE[facility]ICINE  C-SSRS RISK CATEGORY No Risk No Risk High Risk       Last PHQ 2/9 Scores:    10/07/2023   11:14 AM  Depression screen PHQ 2/9  Decreased Interest 3  Down, Depressed, Hopeless 0  PHQ - 2 Score 3  Altered sleeping 0  Tired, decreased energy 0  Change in appetite 0  Feeling bad or failure about yourself  0  Trouble concentrating 3  Moving slowly or fidgety/restless 0  Suicidal thoughts 0  PHQ-9 Score 6  Difficult doing work/chores Extremely dIfficult    Scribe for Treatment Team: Lowry Ram, LCSW 02/02/2024 11:16 AM

## 2024-02-02 NOTE — Group Note (Signed)
 [facility] LCSW Group Therapy Note    Group Date: 02/02/2024 Start Time: 1315 End Time: 1415  Type of Therapy and Topic:  Group Therapy:  Overcoming Obstacles  Participation Level:  [facility] PARTICIPATION LEVEL: Active  Mood:  Description of Group:   In this group patients will be encouraged to explore what they see as obstacles to their own wellness and recovery. They will be guided to discuss their thoughts, feelings, and behaviors related to these obstacles. The group will process together ways to cope with barriers, with attention given to specific choices patients can make. Each patient will be challenged to identify changes they are motivated to make in order to overcome their obstacles. This group will be process-oriented, with patients participating in exploration of their own experiences as well as giving and receiving support and challenge from other group members.  Therapeutic Goals: 1. Patient will identify personal and current obstacles as they relate to admission. 2. Patient will identify barriers that currently interfere with their wellness or overcoming obstacles.  3. Patient will identify feelings, thought process and behaviors related to these barriers. 4. Patient will identify two changes they are willing to make to overcome these obstacles:    Summary of Patient Progress   Patient was present in group.  Patient identified that his barriers and obstacles have been his "attractions" and "thoughts".  Patient reported knowing that his proclivities often lead to negative results.  Patient did engage in discussion on how to address negative thoughts in a general sense.  CSW provided psycho education on how CBT works.  CSW and patient discussed intensive outpatient options.     Therapeutic Modalities:   Cognitive Behavioral Therapy Solution Focused Therapy Motivational Interviewing Relapse Prevention Therapy   Harden Mo,  LCSW

## 2024-02-02 NOTE — Group Note (Signed)
 Recreation Therapy Group Note   Group Topic:Health and Wellness  Group Date: 02/02/2024 Start Time: 1100 End Time: 1200 Facilitators: Rosina Lowenstein, LRT, CTRS Location: Courtyard  Group Description: [employer]. LRT and patients played games of basketball, drew with chalk, and played corn hole while outside in the courtyard while getting fresh air and sunlight. Music was being played in the background. LRT and peers conversed about different games they have played before, what they do in their free time and anything else that is on their minds. LRT encouraged pts to drink water after being outside, sweating and getting their heart rate up.  Goal Area(s) Addressed: Patient will build on frustration tolerance skills. Patients will partake in a competitive play game with peers. Patients will gain knowledge of new leisure interest/hobby.   Affect/Mood: Appropriate   Participation Level: Active and Engaged   Participation Quality: Independent   Behavior: Appropriate, Calm, and Cooperative   Speech/Thought Process: Coherent   Insight: Fair   Judgement: Fair    Modes of Intervention: Activity, Exploration, and Socialization   Patient Response to Interventions:  Attentive, Engaged, and Receptive   Education Outcome:  Acknowledges education   Clinical Observations/Individualized Feedback: David Salinas was active in their participation of session activities and group discussion. Pt chose to talk with peers while in group. Pt interacted well with LRT and peers duration of session.    Plan: Continue to engage patient in RT group sessions 2-3x/week.   [address] LRT, CTRS 02/02/2024 1:38 PM

## 2024-02-02 NOTE — Progress Notes (Signed)
 Patient ID: BINGHAM MILLETTE, male   DOB: 12-02-1990, 33 y.o.   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 He was brought to [facility] ED by [facility] under IVC  petitioned by LCSW at [facility] due to concerns that he has been experiencing increased thoughts of raping women and children, necrophilia. UDS negative, BAL <10   Patient was evaluated and  reported that he was admitted because "my sexual urges have gotten more intense" over the last "few months". Reported that  he had been having thoughts of wanting to rape women, children of any gender, and dead bodies since he was about 35 or 33 yrs old. He reported that  he has these thoughts daily, "they don't bother me, I'm okay with them". He denied raping anyone in the past. He reported that the thought of doing these things "it brings me pleasure... I'm not sure if it's the force or the pain [caused to the victim]... A dead body I just find attractive... I find the skin to be attractive, even if the skin has some slight decomposition I get turned on". He denied having any triggers, denied any recent medication changes. Denied any hx of abuse. Stated that he is compliant with his medications he reported not taking Risperdal Consta anymore as his outpatient psychiatrist "Dr. [place] Sink" did not think this was necessary. He reported  that Paxil was also discontinued by outpatient provider d/t s/e of diarrhea.  He denied any history of mania/hypomania.  Denied any depression, anxiety, difficulty sleeping, changes in appetite, energy levels, significant anxiety.  Denied SI/HI and AVH.  Alert and oriented x 4.  Affect was blunted. He currently has pending charges for child pornography. No access to firearms/weapons. No spiritual beliefs.  Reported hx of cocaine and alcohol abuse, but has been sober x7 yrs. Did complete a rehab program at the time "[facility] in [place]". Denied hx of past trauma.  Pt lives at home with his grandparents, unemployed, no disability benefits. Separated, no children, completed the 10th grade.  Assessment:  Patient is evaluated face-to-face by this provider and chart/nursing notes reviewed. 33 year old who is casually dressed and groomed. He appears healthy and well nourished. He appears to be his stated age. Patient is cooperative upon approach. Alert and oriented. He does not seem to be preoccupied or responding to internal stimuli. He denies SI/HI/AVH.  He reports no major concerns other than experiencing sexual urges toward women and children.  He reports that his goal is to work on this by learning coping skills to help control his obsessive thoughts. Patient has good eye contact and his thought process is coherent. He reports no concerns about his medications. He reports having good appetite and sleep is fair. He admits to having pending legal charges. Patient reports no additional concerns. His behaviors were observed and it was noted that patient maintained appropriate manners toward peers.   We will continue to monitor for symptoms and for safety. Patient continues on current medication regimen and will be reevaluated in AM.

## 2024-02-03 NOTE — Group Note (Signed)
 [facility] LCSW Group Therapy Note   Group Date: 02/03/2024 Start Time: 1300 End Time: 1330  Type of Therapy/Topic:  Group Therapy:  Feelings about Diagnosis  Participation Level:  Active    Description of Group:    This group will allow patients to explore their thoughts and feelings about diagnoses they have received. Patients will be guided to explore their level of understanding and acceptance of these diagnoses. Facilitator will encourage patients to process their thoughts and feelings about the reactions of others to their diagnosis, and will guide patients in identifying ways to discuss their diagnosis with significant others in their lives. This group will be process-oriented, with patients participating in exploration of their own experiences as well as giving and receiving support and challenge from other group members.   Therapeutic Goals: 1. Patient will demonstrate understanding of diagnosis as evidence by identifying two or more symptoms of the disorder:  2. Patient will be able to express two feelings regarding the diagnosis 3. Patient will demonstrate ability to communicate their needs through discussion and/or role plays  Summary of Patient Progress: Patient was present for the entirety of the group process. He shared that his mental health diagnosis does not impact him. Pt stated that other diagnosis impact people more than his impacts him. He did engage in the discussion around stigma. Insight into himself and the topic appeared questionable. He appeared open and receptive to feedback/comments from both his peers and facilitator.   Therapeutic Modalities:   Cognitive Behavioral Therapy Brief Therapy Feelings Identification    Glenis Smoker, LCSW

## 2024-02-03 NOTE — Progress Notes (Signed)
   02/03/24 0900  Psych Admission Type (Psych Patients Only)  Admission Status Involuntary  Psychosocial Assessment  Patient Complaints Other (Comment) (" medicines not helping with his urges.")  Eye Contact Fair  Facial Expression Other (Comment) (WNL)  Affect Appropriate to circumstance  Speech Logical/coherent  Interaction Assertive  Motor Activity Slow  Appearance/Hygiene In scrubs  Behavior Characteristics Cooperative  Mood Pleasant  Thought Process  Coherency WDL  Content WDL  Delusions None reported or observed  Perception WDL  Hallucination None reported or observed  Confusion None  Danger to Self  Current suicidal ideation? Denies  Agreement Not to Harm Self Yes  Description of Agreement verbal  Danger to Others  Danger to Others None reported or observed   Patient rated his depression and anxiety 0/10. Appetite and energy level good. ADLs maintained .Support and encouragement given.

## 2024-02-03 NOTE — Group Note (Signed)
 Date:  02/03/2024 Time:  10:13 AM  Group Topic/Focus:  Wellness Toolbox:   The focus of this group is to discuss various aspects of wellness, balancing those aspects and exploring ways to increase the ability to experience wellness.  Patients will create a wellness toolbox for use upon discharge.    Participation Level:  Active  Participation Quality:  Appropriate  Affect:  Appropriate  Cognitive:  Appropriate  Insight: Appropriate  Engagement in Group:  Engaged  Modes of Intervention:  Activity  Additional Comments:    Lynelle Smoke Myrick Mcnairy 02/03/2024, 10:13 AM

## 2024-02-03 NOTE — Group Note (Unsigned)
 Date:  02/03/2024 Time:  8:57 PM  Group Topic/Focus:  Wrap-Up Group:   The focus of this group is to help patients review their daily goal of treatment and discuss progress on daily workbooks.     Participation Level:  {BHH PARTICIPATION VWUJW:11914}  Participation Quality:  {B[facility] PARTICIPATION QUALITY:22265}  Affect:  {BHH AFFECT:22266}  Cognitive:  {BHH COGNITIVE:22267}  Insight: {BHH Insight2:20797}  Engagement in Group:  {BHH ENGAGEMENT IN NWGNF:62130}  Modes of Intervention:  {BHH MODES OF INTERVENTION:22269}  Additional Comments:  ***  Belva Crome 02/03/2024, 8:57 PM

## 2024-02-03 NOTE — Group Note (Signed)
 Date:  02/03/2024 Time:  4:14 PM  Group Topic/Focus:  Wellness Toolbox:   The focus of this group is to discuss various aspects of wellness, balancing those aspects and exploring ways to increase the ability to experience wellness.  Patients will create a wellness toolbox for use upon discharge.    Participation Level:  Active  Participation Quality:  Appropriate  Affect:  Appropriate  Cognitive:  Appropriate  Insight: Appropriate  Engagement in Group:  Engaged  Modes of Intervention:  Activity  Additional Comments:    Wilford Corner 02/03/2024, 4:14 PM

## 2024-02-03 NOTE — Group Note (Signed)
 Recreation Therapy Group Note   Group Topic:Self-Esteem  Group Date: 02/03/2024 Start Time: 1000 End Time: 1045 Facilitators: Rosina Lowenstein, LRT, CTRS Location:  Craft Room  Group Description: Positive Affirmation Worksheet. Patients and LRT discussed the importance of self-love/self-esteem and things that cause it to fluctuate, including our mental health. Pts then completed a worksheet that helps them identify 24 different strengths and qualities about themselves. Pt encouraged to read aloud at least 3 off their sheet to the group. Afterwards, LRT and pts play positive affirmation bingo. LRT and pts discussed how this can be applied to daily life post-discharge.   Goal Area(s) Addressed: Patient will identify positive qualities about themselves. Patient will learn new positive affirmations.  Patient will recite positive qualities and affirmations aloud to the group.  Patient will practice positive self-talk.  Patient will increase communication.   Affect/Mood: Appropriate   Participation Level: Active and Engaged   Participation Quality: Independent   Behavior: Appropriate, Calm, and Cooperative   Speech/Thought Process: Coherent   Insight: Good   Judgement: Good   Modes of Intervention: Exploration, Guided Discussion, Socialization, Support, Worksheet, and Writing   Patient Response to Interventions:  Attentive   Education Outcome:  Acknowledges education   Clinical Observations/Individualized Feedback: David Salinas was active in their participation of session activities and group discussion. Pt identified "My friends respect me because I am honest and open. Thr two things I do best are listen to others and time management. I know I will be successful in life because I will continue trying to be." Pt interacted well with LRT and peers duration of session.    Plan: Continue to engage patient in RT group sessions 2-3x/week.   Rosina Lowenstein, LRT, CTRS 02/03/2024 1:22 PM

## 2024-02-03 NOTE — Plan of Care (Signed)
  Problem: Activity: Goal: Interest or engagement in activities will improve Outcome: Progressing   Problem: Education: Goal: Mental status will improve Outcome: Progressing   Problem: Education: Goal: Emotional status will improve Outcome: Progressing

## 2024-02-03 NOTE — Progress Notes (Cosign Needed Addendum)
 Patient ID: David Salinas, male   DOB: [birth year], 33 y.o.   MRN: [id number]  Patient is a 33 year old male who presented with a hx of GAD, MDD with psychosis, along with medical hx of HTN. He presented involuntarily, petitioned by LCSW at [facility] due to concerns that he has been experiencing increased thoughts of raping women and children. Patient was evaluated by a clinician and he reported having sexual urges, wanting to rape women, children of any gender, and dead bodies. He reported that he started experiencing that problem around age 34. Patient reported that he has been living with these thoughts without any incident. He denied hx of raping anyone. He reported that he gets pleasure from doing that. He denied having any triggers, denied recent stressful event. He reported not having any concerns with his current medications. He reported no family issues. Patient lives with his grandparents who are supportive.  Patient was admitted and restarted on his medications including including Gabapentin, Prozac,Losartan,  Metoprolol, Risperidone, and Trazodone, and has not expressed any concerns about. Per nursing, patient has not experienced any behavioral problems. He has been active in the milieu, attending groups with peers.   Assessment:   Patient is evaluated face-to-face by this provider and chart/nursing notes reviewed. David Salinas y is a  33 year old male  who is pleasant and cooperative upon approach. He is appropriately  dressed and groomed. He appears healthy and well nourished. He appears to be his stated age.  Alert and oriented. He does not seem to be preoccupied or responding to internal stimuli. He denies SI/HI/AVH.  He reports no major concerns other than experiencing sexual urges toward women and children.  He reports that his sexual thoughts are not very active and mentions that he has always had them since adolescence. He has never acted on them and has no plan to do so.  Patient has good eye  contact and his thought process is coherent. He reports no concerns about his medications. He reports having good appetite and sleep is fair. He admits to having pending legal charges, which he will take care of upon discharge. Patient reports no additional concerns. His behaviors were observed and it was noted that patient maintained appropriate manners toward peers. Provider attempted to contacted the Social worker at [facility] for collateral information but office is currently closed.   Patient does not appear to be in any acute distress. He denies SI/HI/AVH and reports no plan to act on his sexual thinking. He reports no concerns about medications. He reports having good support  and want to continue with outpatient services.  I discussed  potential discharge with DR Irwin Brakeman, MD and she recommended discharge in AM upon speaking with Social worker at [employer].

## 2024-02-03 NOTE — Group Note (Signed)
 Date:  02/03/2024 Time:  10:08 AM  Group Topic/Focus:  Goals Group:   The focus of this group is to help patients establish daily goals to achieve during treatment and discuss how the patient can incorporate goal setting into their daily lives to aide in recovery.    Participation Level:  Active  Participation Quality:  Appropriate  Affect:  Appropriate  Cognitive:  Appropriate  Insight: Appropriate  Engagement in Group:  Engaged  Modes of Intervention:  Activity  Additional Comments:    Lynelle Smoke Charise Leinbach 02/03/2024, 10:08 AM

## 2024-02-03 NOTE — Group Note (Signed)
 Date:  02/03/2024 Time:  10:25 PM  Group Topic/Focus:  Wrap-Up Group:   The focus of this group is to help patients review their daily goal of treatment and discuss progress on daily workbooks.    Participation Level:  Active  Participation Quality:  Appropriate, Attentive, Sharing, and Supportive  Affect:  Appropriate  Cognitive:  Appropriate  Insight: Appropriate and Good  Engagement in Group:  Engaged and Supportive  Modes of Intervention:  Discussion and Support  Additional Comments:     Belva Crome 02/03/2024, 10:25 PM

## 2024-02-04 DIAGNOSIS — F39 Unspecified mood [affective] disorder: Secondary | ICD-10-CM | POA: Diagnosis not present

## 2024-02-04 NOTE — Group Note (Signed)
 Date:  02/04/2024 Time:  7:22 PM  Group Topic/Focus:  Activity Group: The focus of the group is to promote activity for the patients and encourage them to go outside in the courtyard for exercise and fresh air.    Participation Level:  Active  Participation Quality:  Appropriate  Affect:  Appropriate  Cognitive:  Appropriate  Insight: Appropriate  Engagement in Group:  Engaged  Modes of Intervention:  Activity  Additional Comments:    David Salinas 02/04/2024, 7:22 PM

## 2024-02-04 NOTE — Progress Notes (Signed)
 [facility] MD Progress Note  02/04/2024 5:14 PM David Salinas  MRN:  [id number]  33 year old separated Caucasian male with reported hx of MDD with psychosis, GAD, medical hx of HTN, HLD brought to [facility] ED by [facility] under IVC  petitioned by LCSW at [facility] due to concerns that he has been experiencing increased thoughts of raping women and children, necrophilia. UDS negative, BAL <10   Subjective:  Chart reviewed, case discussed in multidisciplinary meeting, patient seen during rounds. Patient reports doing well and states that his urges are not there anymore and he doesn't feel like he is going to act on them.he denies feeling depressed ,anxious. He denies SI/HI/plan.He denies auditory/visual hallucinations. Per nursing pt is displaying safe behaviors on the unit.   Sleep: Fair  Appetite:  Fair  Past Psychiatric History: see h&P Family History:  Family History  Problem Relation Age of Onset   Anxiety disorder Mother    Depression Mother    Hypertension Mother    Diabetes Mother    Hypothyroidism Mother    Anxiety disorder Father    Hypertension Father    Hyperlipidemia Father    Lymphoma Maternal Grandfather    Social History:  Social History   Substance and Sexual Activity  Alcohol Use Not Currently     Social History   Substance and Sexual Activity  Drug Use No    Social History   Socioeconomic History   Marital status: Legally Separated    Spouse name: Not on file   Number of children: Not on file   Years of education: Not on file   Highest education level: Not on file  Occupational History   Not on file  Tobacco Use   Smoking status: Former    Current packs/day: 0.50    Types: Cigarettes   Smokeless tobacco: Never  Vaping Use   Vaping status: Never Used  Substance and Sexual Activity   Alcohol use: Not Currently   Drug use: No   Sexual activity: Not on file  Other Topics Concern   Not on file  Social History Narrative   Not on file   Social Drivers of Health    Financial Resource Strain: Not on file  Food Insecurity: No Food Insecurity (01/31/2024)   Hunger Vital Sign    Worried About Running Out of Food in the Last Year: Never true    Ran Out of Food in the Last Year: Never true  Recent Concern: Food Insecurity - Food Insecurity Present (11/03/2023)   Hunger Vital Sign    Worried About Running Out of Food in the Last Year: Sometimes true    Ran Out of Food in the Last Year: Sometimes true  Transportation Needs: No Transportation Needs (01/31/2024)   PRAPARE - Administrator, Civil Service (Medical): No    Lack of Transportation (Non-Medical): No  Physical Activity: Not on file  Stress: Not on file  Social Connections: Socially Isolated (10/07/2023)   Social Connection and Isolation Panel [NHANES]    Frequency of Communication with Friends and Family: More than three times a week    Frequency of Social Gatherings with Friends and Family: More than three times a week    Attends Religious Services: Never    Database administrator or Organizations: No    Attends Banker Meetings: Never    Marital Status: Separated   Past Medical History:  Past Medical History:  Diagnosis Date   Anxiety    Asthma  Chronic bronchitis (HCC)    Hypertension    Panic attacks     Past Surgical History:  Procedure Laterality Date   TONSILLECTOMY      Current Medications: Current Facility-Administered Medications  Medication Dose Route Frequency Provider Last Rate Last Admin   acetaminophen (TYLENOL) tablet 650 mg  650 mg Oral Q6H PRN Tingling, Stephanie, PA-C   650 mg at 02/02/24 1823   alum & mag hydroxide-simeth (MAALOX/MYLANTA) 200-200-20 MG/5ML suspension 30 mL  30 mL Oral Q4H PRN Tingling, Stephanie, PA-C       haloperidol (HALDOL) tablet 5 mg  5 mg Oral TID PRN Tingling, Stephanie, PA-C       And   diphenhydrAMINE (BENADRYL) capsule 50 mg  50 mg Oral TID PRN Tingling, Stephanie, PA-C       haloperidol lactate (HALDOL)  injection 5 mg  5 mg Intramuscular TID PRN Tingling, Stephanie, PA-C       And   diphenhydrAMINE (BENADRYL) injection 50 mg  50 mg Intramuscular TID PRN Tingling, Stephanie, PA-C       And   LORazepam (ATIVAN) injection 2 mg  2 mg Intramuscular TID PRN Tingling, Stephanie, PA-C       haloperidol lactate (HALDOL) injection 10 mg  10 mg Intramuscular TID PRN Tingling, Stephanie, PA-C       And   diphenhydrAMINE (BENADRYL) injection 50 mg  50 mg Intramuscular TID PRN Tingling, Stephanie, PA-C       And   LORazepam (ATIVAN) injection 2 mg  2 mg Intramuscular TID PRN Tingling, Stephanie, PA-C       FLUoxetine (PROZAC) capsule 60 mg  60 mg Oral Daily Tingling, Stephanie, PA-C   60 mg at 02/04/24 0831   gabapentin (NEURONTIN) capsule 100 mg  100 mg Oral TID Tingling, Stephanie, PA-C   100 mg at 02/04/24 0831   hydrOXYzine (ATARAX) tablet 25 mg  25 mg Oral TID PRN Tingling, Stephanie, PA-C       losartan (COZAAR) tablet 100 mg  100 mg Oral QPC breakfast Tingling, Stephanie, PA-C   100 mg at 02/03/24 4098   magnesium hydroxide (MILK OF MAGNESIA) suspension 30 mL  30 mL Oral Daily PRN Tingling, Stephanie, PA-C       metoprolol succinate (TOPROL-XL) 24 hr tablet 50 mg  50 mg Oral Daily Tingling, Stephanie, PA-C   50 mg at 02/04/24 0831   risperiDONE (RISPERDAL) tablet 2 mg  2 mg Oral QHS Tingling, Stephanie, PA-C   2 mg at 02/03/24 2144   traZODone (DESYREL) tablet 100 mg  100 mg Oral QHS PRN Tingling, Stephanie, PA-C   100 mg at 02/03/24 2144    Lab Results: No results found for this or any previous visit (from the past 48 hours).  Blood Alcohol level:  Lab Results  Component Value Date   ETH <10 01/30/2024   ETH <10 11/02/2023    Metabolic Disorder Labs: Lab Results  Component Value Date   HGBA1C 5.9 (H) 09/27/2023   MPG 122.63 09/27/2023   MPG 100 03/16/2009   No results found for: "PROLACTIN" Lab Results  Component Value Date   CHOL 200 09/26/2023   TRIG 118 09/26/2023   HDL 23 (L)  09/26/2023   CHOLHDL 8.7 09/26/2023   VLDL 24 09/26/2023   LDLCALC 153 (H) 09/26/2023   LDLCALC (H) 03/16/2009    113        Total Cholesterol/HDL:CHD Risk Coronary Heart Disease Risk Table  Men   Women  1/2 Average Risk   3.4   3.3  Average Risk       5.0   4.4  2 X Average Risk   9.6   7.1  3 X Average Risk  23.4   11.0        Use the calculated Patient Ratio above and the CHD Risk Table to determine the patient's CHD Risk.        ATP III CLASSIFICATION (LDL):  <100     mg/dL   Optimal  478-295  mg/dL   Near or Above                    Optimal  130-159  mg/dL   Borderline  621-308  mg/dL   High  >657     mg/dL   Very High    Physical Findings: AIMS:  , ,  ,  ,    CIWA:    COWS:      Psychiatric Specialty Exam:  Presentation  General Appearance:  Appropriate for Environment; Casual  Eye Contact: Fair  Speech: Clear and Coherent  Speech Volume: Normal    Mood and Affect  Mood: Anxious  Affect: Depressed   Thought Process  Thought Processes: Coherent  Descriptions of Associations:Intact  Orientation:Full (Time, Place and Person)  Thought Content:Illogical  Hallucinations:Hallucinations: None  Ideas of Reference:None  Suicidal Thoughts:Suicidal Thoughts: No  Homicidal Thoughts:Homicidal Thoughts: No   Sensorium  Memory: Immediate Fair; Recent Fair; Remote Fair  Judgment: Impaired  Insight: Shallow   Executive Functions  Concentration: Fair  Attention Span: Fair  Recall: [employer] of Knowledge: Fair  Language: Fair   Psychomotor Activity  Psychomotor Activity: Psychomotor Activity: Normal  Musculoskeletal: Strength & Muscle Tone: within normal limits Gait & Station: normal Assets  Assets: Desire for Improvement; Communication Skills; Physical Health    Physical Exam: Physical Exam Vitals and nursing note reviewed.  HENT:     Head: Normocephalic.     Right Ear: Tympanic membrane  normal.     Mouth/Throat:     Mouth: Mucous membranes are moist.  Cardiovascular:     Rate and Rhythm: Normal rate.     Pulses: Normal pulses.  Pulmonary:     Breath sounds: Normal breath sounds.  Abdominal:     General: Bowel sounds are normal.  Musculoskeletal:        General: Normal range of motion.  Skin:    General: Skin is warm.  Neurological:     General: No focal deficit present.     Mental Status: He is alert.    Review of Systems  Constitutional: Negative.   HENT: Negative.    Eyes: Negative.   Gastrointestinal: Negative.   Skin: Negative.   Neurological: Negative.    Blood pressure 102/61, pulse (!) 56, temperature (!) 97.3 F (36.3 C), resp. rate 20, height 5\' 6"  (1.676 m), weight 90.7 kg, SpO2 95%. Body mass index is 32.28 kg/m.  Diagnosis: Principal Problem:   Mood disorder (HCC)   Safety and Monitoring:   --  Involuntary admission to inpatient psychiatric unit for safety, stabilization and treatment -- Daily contact with patient to assess and evaluate symptoms and progress in treatment -- Patient's case to be discussed in multi-disciplinary team meeting -- Observation Level : q15 minute checks -- Vital signs:  q12 hours -- Precautions: assault   2. Psychiatric Diagnoses and Treatment: Pt with hx of MDD with psychosis, reports ruminating thoughts of desire  to rape children, and women, necrophilia. That these thoughts are always present, however do not cause distress. Has pending charges for child pornography. Current diagnosis of unspecified mood disorder, R/O Pedophilia disorder vs Sadism disorder   --  Prozac 60 mg daily for mood/anxiety -- Continue Risperdal 2 mg HS for mood stabilization  -- Continue Trazodone 100 mg HS PRN insomnia  -- Labs ordered for RPR, HIV, TSH -- Cr and WBC ,mildly elevated, will repeat CBC and CMP to see if still trending upward.  -- Will recommend forensic testing at discharge   --  The  risks/benefits/side-effects/alternatives to this medication were discussed in detail with the patient and time was given for questions. The patient consents to medication trial. -- Metabolic profile and EKG monitoring obtained while on an atypical antipsychotic  -- Encouraged patient to participate in unit milieu and in scheduled group therapies -- Short Term Goals: Ability to identify changes in lifestyle to reduce recurrence of condition will improve, Ability to verbalize feelings will improve, Ability to disclose and discuss suicidal ideas, Ability to demonstrate self-control will improve, Ability to identify and develop effective coping behaviors will improve, Ability to maintain clinical measurements within normal limits will improve, Compliance with prescribed medications will improve, and Ability to identify triggers associated with substance abuse/mental health issues will improve -- Long Term Goals: Improvement in symptoms so as ready for discharge        3. Medical Issues Being Addressed:               Hypertension             -- Continue home meds Metoprolol succinate 50 mg daily, losartan 100 mg daily                Hyperlipidemia             -- Restart Atorvastatin 10 mg HS    4. Discharge Planning:   -- Social work and case management to assist with discharge planning and identification of hospital follow-up needs prior to discharge -- Estimated LOS: 3-5 days -- Discharge Concerns: Need to establish a safety plan; Medication compliance and effectiveness -- Discharge Goals: Return home with outpatient referrals for mental health follow-up including medication management/psychotherapy     Verner Chol, MD 02/04/2024, 5:14 PM

## 2024-02-04 NOTE — Plan of Care (Signed)
   Problem: Education: Goal: Emotional status will improve Outcome: Progressing Goal: Mental status will improve Outcome: Progressing

## 2024-02-04 NOTE — Progress Notes (Signed)
   02/03/24 2230  Psych Admission Type (Psych Patients Only)  Admission Status Involuntary  Psychosocial Assessment  Patient Complaints None  Eye Contact Brief  Facial Expression Blank  Affect Appropriate to circumstance  Speech Logical/coherent  Interaction Assertive  Motor Activity Other (Comment) (WDL)  Appearance/Hygiene In scrubs  Behavior Characteristics Cooperative  Mood Pleasant  Thought Process  Coherency WDL  Content WDL  Delusions None reported or observed  Perception WDL  Hallucination None reported or observed  Confusion None  Danger to Self  Current suicidal ideation?  (Denies)  Agreement Not to Harm Self Yes  Description of Agreement Verbal  Danger to Others  Danger to Others Reported or observed  Danger to Others Abnormal  Harmful Behavior to others No threats or harm toward other people  Destructive Behavior No threats or harm toward property

## 2024-02-04 NOTE — Progress Notes (Signed)
   02/04/24 1800  Psychosocial Assessment  Eye Contact None  Facial Expression Animated  Affect Appropriate to circumstance  Speech Logical/coherent  Interaction Assertive  Motor Activity Slow  Appearance/Hygiene In scrubs  Thought Process  Coherency WDL  Content WDL  Delusions None reported or observed  Perception WDL  Hallucination None reported or observed  Judgment Impaired  Confusion None  Danger to Self  Current suicidal ideation? Denies  Agreement Not to Harm Self Yes  Description of Agreement verbal  Danger to Others  Danger to Others None reported or observed

## 2024-02-04 NOTE — Group Note (Signed)
 Date:  02/04/2024 Time:  9:23 PM  Group Topic/Focus:  Wrap-Up Group:   The focus of this group is to help patients review their daily goal of treatment and discuss progress on daily workbooks.    Participation Level:  Active  Participation Quality:  Appropriate  Affect:  Appropriate  Cognitive:  Appropriate  Insight: Appropriate  Engagement in Group:  Engaged  Modes of Intervention:  Discussion    David Salinas 02/04/2024, 9:23 PM

## 2024-02-04 NOTE — Progress Notes (Signed)
 Pt calm and pleasant during assessment denying SI/HI/AVH. Pt stated he hasn't had any intrusive thoughts today. Pt observed interacting appropriately with staff and peers on the unit. Pt compliant with medication administration per MD orders. Pt given education, support, and encouragement to be active in his treatment plan. Pt being monitored Q 15 minutes for safety per unit protocol, remains safe on the unit

## 2024-02-04 NOTE — Group Note (Signed)
 LCSW Group Therapy Note   Group Date: 02/04/2024 Start Time: 1300 End Time: 1422   Type of Therapy and Topic:  Group Therapy: Challenging Core Beliefs  Participation Level:  Active  Description of Group:  Patients were educated about core beliefs and asked to identify one harmful core belief that they have. Patients were asked to explore from where those beliefs originate. Patients were asked to discuss how those beliefs make them feel and the resulting behaviors of those beliefs. They were then be asked if those beliefs are true and, if so, what evidence they have to support them. Lastly, group members were challenged to replace those negative core beliefs with helpful beliefs.   Therapeutic Goals:   1. Patient will identify harmful core beliefs and explore the origins of such beliefs. 2. Patient will identify feelings and behaviors that result from those core beliefs. 3. Patient will discuss whether such beliefs are true. 4.  Patient will replace harmful core beliefs with helpful ones.  Summary of Patient Progress:  Patient actively engaged in processing and exploring how core beliefs are formed and how they impact thoughts, feelings, and behaviors. Patient proved open to input from peers and feedback from CSW. Patient demonstrated proficient insight into the subject matter, was respectful and supportive of peers, and participated throughout the entire session.  Therapeutic Modalities: Cognitive Behavioral Therapy; Solution-Focused Therapy   Perrin Smack 02/04/2024  2:41 PM

## 2024-02-05 DIAGNOSIS — F39 Unspecified mood [affective] disorder: Secondary | ICD-10-CM | POA: Diagnosis not present

## 2024-02-05 MED ORDER — FLUOXETINE HCL 20 MG PO CAPS: 60.0000 mg | ORAL_CAPSULE | Freq: Every day | ORAL | 0 refills | Status: AC

## 2024-02-05 MED ORDER — METOPROLOL SUCCINATE ER 50 MG PO TB24: 50.0000 mg | ORAL_TABLET | Freq: Every day | ORAL | 0 refills | Status: AC

## 2024-02-05 NOTE — Group Note (Signed)
 Date:  02/05/2024 Time:  9:50 AM  Group Topic/Focus:  Self Care:   The focus of this group is to help patients understand the importance of self-care in order to improve or restore emotional, physical, spiritual, interpersonal, and financial health.    Participation Level:  Active  Participation Quality:  Appropriate  Affect:  Appropriate  Cognitive:  Appropriate  Insight: Appropriate  Engagement in Group:  Engaged  Modes of Intervention:  Activity  Additional Comments:    David Salinas [name] 02/05/2024, 9:50 AM

## 2024-02-05 NOTE — [facility] Suicide Risk Assessment (Deleted)
 [facility] INPATIENT:  Family/Significant Other Suicide Prevention Education  Suicide Prevention Education:  Contact Attempts: Audrie Gallus, [phone], Brother, has been identified by the patient as the family member/significant other with whom the patient will be residing, and identified as the person(s) who will aid the patient in the event of a mental health crisis.  With written consent from the patient, two attempts were made to provide suicide prevention education, prior to and/or following the patient's discharge.  We were unsuccessful in providing suicide prevention education.  A suicide education pamphlet was given to the patient to share with family/significant other.  Date and time of first attempt:02/05/24 at 3:43 PM.  Date and time of second attempt: Second attempt is needed.   David Salinas 02/05/2024, 3:43 PM

## 2024-02-05 NOTE — Group Note (Signed)
 Date:  02/05/2024 Time:  11:02 PM  Group Topic/Focus:  Relapse Prevention Planning:   The focus of this group is to define relapse and discuss the need for planning to combat relapse.    Participation Level:  Active  Participation Quality:  Appropriate  Affect:  Appropriate and Irritable  Cognitive:  Alert and Appropriate  Insight: Appropriate  Engagement in Group:  Engaged  Modes of Intervention:  Discussion and Support  Additional Comments:  Patient was an active participant in group. Patient was nervous regarding attending group because of prior interactions with peers. However, after rules were stated by facilitators the group was able to support the patient in trigger identification and ways to cope without self medicating.  Lorenda Ishihara 02/05/2024, 11:02 PM

## 2024-02-05 NOTE — Plan of Care (Signed)
   Problem: Education: Goal: Knowledge of [place] [facility] Education information/materials will improve Outcome: Progressing Goal: Emotional status will improve Outcome: Progressing

## 2024-02-05 NOTE — Group Note (Signed)
 Recreation Therapy Group Note   Group Topic:General Recreation  Group Date: 02/05/2024 Start Time: 1000 End Time: 1100 Facilitators: Rosina Lowenstein, LRT, CTRS Location: Courtyard  Group Description: [employer]. LRT and patients played games of basketball, drew with chalk, and played corn hole while outside in the courtyard while getting fresh air and sunlight. Music was being played in the background. LRT and peers conversed about different games they have played before, what they do in their free time and anything else that is on their minds. LRT encouraged pts to drink water after being outside, sweating and getting their heart rate up.  Goal Area(s) Addressed: Patient will build on frustration tolerance skills. Patients will partake in a competitive play game with peers. Patients will gain knowledge of new leisure interest/hobby.    Affect/Mood: N/A   Participation Level: Did not attend    Clinical Observations/Individualized Feedback: Patient did not attend group.   Plan: Continue to engage patient in RT group sessions 2-3x/week.   Rosina Lowenstein, LRT, CTRS 02/05/2024 1:33 PM

## 2024-02-05 NOTE — BH Assessment (Signed)
 Patient denies SI and HI. Patient states he is having thoughts and urges. Also stating that they are not going away. Patient has a flat affect. Minimal and soft.
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
## 2024-02-05 NOTE — Group Note (Signed)
 LCSW Group Therapy Note  Group Date: 02/05/2024 Start Time: 1300 End Time: 1350   Type of Therapy and Topic:  Group Therapy: Anger Cues and Responses  Participation Level:  Minimal   Description of Group:   In this group, patients learned how to recognize the physical, cognitive, emotional, and behavioral responses they have to anger-provoking situations.  They identified a recent time they became angry and how they reacted.  They analyzed how their reaction was possibly beneficial and how it was possibly unhelpful.  The group discussed a variety of healthier coping skills that could help with such a situation in the future.  Focus was placed on how helpful it is to recognize the underlying emotions to our anger, because working on those can lead to a more permanent solution as well as our ability to focus on the important rather than the urgent.  Therapeutic Goals: Patients will remember their last incident of anger and how they felt emotionally and physically, what their thoughts were at the time, and how they behaved. Patients will identify how their behavior at that time worked for them, as well as how it worked against them. Patients will explore possible new behaviors to use in future anger situations. Patients will learn that anger itself is normal and cannot be eliminated, and that healthier reactions can assist with resolving conflict rather than worsening situations.  Summary of Patient Progress:   Patient  was active during the group. Patient was attentive as evidenced by nodding his head, however did not engage in discussions.    Therapeutic Modalities:   Cognitive Behavioral Therapy    Harden Mo, LCSW 02/05/2024  2:25 PM

## 2024-02-05 NOTE — [facility] Counselor (Signed)
 CSW reached out to [facility] to schedule follow up appointment. CSW was unable to establish contact with scheduler but left HIPAA compliant voicemail with contact information for follow through.   CSW will continue to follow.   Vilma Meckel. Algis Greenhouse, MSW, LCSW, LCAS 02/05/2024 1:54 PM
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
## 2024-02-05 NOTE — [facility] Counselor (Signed)
 CSW touched base with [employer] Patient does not having a pending court case but a pending investigation.   CSW team to continue to engage in safe discharge planning.    Reymundo Poll, MSW, LCSWA 02/05/2024 9:39 AM

## 2024-02-06 DIAGNOSIS — F39 Unspecified mood [affective] disorder: Secondary | ICD-10-CM | POA: Diagnosis not present

## 2024-02-06 NOTE — Discharge Summary (Signed)
 Physician Discharge Summary Note  Patient:  David Salinas is an 33 y.o., male MRN:  [id number] DOB:  [birth year] Patient phone:  [phone] (home)  Patient address:   [address] 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 UDS negative, BAL <10   Principal Problem: Mood disorder [facility]) Discharge Diagnoses: Principal Problem:   Mood disorder (HCC)   Past Psychiatric History: see h&p  Family Psychiatric  History: see h&p Social History:  Social History   Substance and Sexual Activity  Alcohol Use Not Currently     Social History   Substance and Sexual Activity  Drug Use No    Social History   Socioeconomic History   Marital status: Legally Separated    Spouse name: Not on file   Number of children: Not on file   Years of education: Not on file   Highest education level: Not on file  Occupational History   Not on file  Tobacco Use   Smoking status: Former    Current packs/day: 0.50    Types: Cigarettes   Smokeless tobacco: Never  Vaping Use   Vaping status: Never Used  Substance and Sexual Activity   Alcohol use: Not Currently   Drug use: No   Sexual activity: Not on file  Other Topics Concern   Not on file  Social History Narrative   Not on file   Social Drivers of Health   Financial Resource Strain: Not on file  Food Insecurity: No Food Insecurity (01/31/2024)   Hunger Vital Sign    Worried About Running Out of Food in the Last Year: Never true    Ran Out of Food in the Last Year: Never true  Recent Concern: Food Insecurity - Food Insecurity Present (11/03/2023)   Hunger Vital Sign    Worried About Running Out of  Food in the Last Year: Sometimes true    Ran Out of Food in the Last Year: Sometimes true  Transportation Needs: No Transportation Needs (01/31/2024)   PRAPARE - Administrator, Civil Service (Medical): No    Lack of Transportation (Non-Medical): No  Physical Activity: Not on file  Stress: Not on file  Social Connections: Socially Isolated (10/07/2023)   Social Connection and Isolation Panel [NHANES]    Frequency of Communication with Friends and Family: More than three times a week    Frequency of Social Gatherings with Friends and Family: More than three times a week    Attends Religious Services: Never    Database administrator or Organizations: No    Attends Engineer, structural: Never    Marital Status: Separated   Past Medical History:  Past Medical History:  Diagnosis Date   Anxiety    Asthma    Chronic bronchitis (HCC)    Hypertension    Panic attacks     Past Surgical History:  Procedure Laterality Date   TONSILLECTOMY     Family History:  Family History  Problem Relation Age of Onset   Anxiety disorder Mother    Depression Mother    Hypertension Mother    Diabetes Mother  Hypothyroidism Mother    Anxiety disorder Father    Hypertension Father    Hyperlipidemia Father    Lymphoma Maternal Grandfather     Hospital Course:   89 year old separated Caucasian male with 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 UDS negative, BAL <10 .  Patient is at did for inpatient hospitalization for safety evaluation and treatment.  On admission pt with hx of MDD with psychosis, reports ruminating thoughts of desire to rape children, and women, necrophilia. That these thoughts are always present, however do not cause distress. Has pending charges for child pornography.  Current diagnosis of unspecified mood disorder, R/O Pedophilia disorder vs Sadism disorder.  Patient's Prozac was increased from 40 mg to 60 mg and continued on Risperdal 2 mg nightly depressed mood stabilization.  Patient's intrusive thoughts seems to be part of sexual disorder which needs further forensic evaluation and treatment ordered by the judge.  Hopefully during his court hearing judge will be able to provide him and direct him to the right resources.  Patient has displayed safe behaviors on the unit since his admission.  He was honest in discussing his thoughts and urges but is able to reflect on the consequences of going to prison for rest of the life if he acts on them.  He denied any intention on acting on his sexualized thoughts stating that he does not want to go to prison.  He consistently denied SI/HI/intent/plan.  He denies auditory/visual hallucinations.  He has good follow-up at [facility] and is willing to participate in outpatient services and attend his court hearing.  Inpatient psychiatric hospitalization is a highly restrictive setting at this time. Physical Findings: AIMS:  , ,  ,  ,    CIWA:    COWS:        Psychiatric Specialty Exam:  Presentation  General Appearance:  Appropriate for Environment; Casual  Eye Contact: Fair  Speech: Clear and Coherent  Speech Volume: Normal    Mood and Affect  Mood: Euthymic  Affect: Appropriate   Thought Process  Thought Processes: Coherent  Descriptions of Associations:Intact  Orientation:Full (Time, Place and Person)  Thought Content:Logical  Hallucinations:Hallucinations: None  Ideas of Reference:None  Suicidal Thoughts:Suicidal Thoughts: No  Homicidal Thoughts:Homicidal Thoughts: No   Sensorium  Memory: Immediate Fair; Recent Fair; Remote Fair  Judgment: Fair  Insight: Fair   Art therapist  Concentration: Fair  Attention Span: Fair  Recall: [employer] of  Knowledge: Fair  Language: Fair   Psychomotor Activity  Psychomotor Activity: Psychomotor Activity: Normal  Musculoskeletal: Strength & Muscle Tone: within normal limits Gait & Station: normal Assets  Assets: Manufacturing systems engineer; Desire for Improvement; Physical Health   Sleep  Sleep: Sleep: Fair    Physical Exam: Physical Exam Vitals and nursing note reviewed.  HENT:     Head: Normocephalic.     Right Ear: Tympanic membrane normal.     Nose: Nose normal.  Eyes:     Pupils: Pupils are equal, round, and reactive to light.  Cardiovascular:     Rate and Rhythm: Normal rate.     Pulses: Normal pulses.  Pulmonary:     Effort: Pulmonary effort is normal.  Abdominal:     General: Bowel sounds are normal.  Neurological:     Mental Status:  He is alert.    Review of Systems  Constitutional: Negative.   HENT: Negative.    Eyes: Negative.   Cardiovascular: Negative.   Gastrointestinal: Negative.   Skin: Negative.    Blood pressure 108/71, pulse 80, temperature 97.7 F (36.5 C), resp. rate 16, height 5\' 6"  (1.676 m), weight 90.7 kg, SpO2 96%. Body mass index is 32.28 kg/m.   Social History   Tobacco Use  Smoking Status Former   Current packs/day: 0.50   Types: Cigarettes  Smokeless Tobacco Never   Tobacco Cessation:  N/A, patient does not currently use tobacco products   Blood Alcohol level:  Lab Results  Component Value Date   ETH <10 01/30/2024   ETH <10 11/02/2023    Metabolic Disorder Labs:  Lab Results  Component Value Date   HGBA1C 5.9 (H) 09/27/2023   MPG 122.63 09/27/2023   MPG 100 03/16/2009   No results found for: "PROLACTIN" Lab Results  Component Value Date   CHOL 200 09/26/2023   TRIG 118 09/26/2023   HDL 23 (L) 09/26/2023   CHOLHDL 8.7 09/26/2023   VLDL 24 09/26/2023   LDLCALC 153 (H) 09/26/2023   LDLCALC (H) 03/16/2009    113        Total Cholesterol/HDL:CHD Risk Coronary Heart Disease Risk Table                      Men   Women  1/2 Average Risk   3.4   3.3  Average Risk       5.0   4.4  2 X Average Risk   9.6   7.1  3 X Average Risk  23.4   11.0        Use the calculated Patient Ratio above and the CHD Risk Table to determine the patient's CHD Risk.        ATP III CLASSIFICATION (LDL):  <100     mg/dL   Optimal  161-096  mg/dL   Near or Above                    Optimal  130-159  mg/dL   Borderline  045-409  mg/dL   High  >811     mg/dL   Very High    See Psychiatric Specialty Exam and Suicide Risk Assessment completed by Attending Physician prior to discharge.  Discharge destination:  Home  Is patient on multiple antipsychotic therapies at discharge:  No   Has Patient had three or more failed trials of antipsychotic monotherapy by history:  No  Recommended Plan for Multiple Antipsychotic Therapies: NA  Discharge Instructions     Diet - low sodium heart healthy   Complete by: As directed    Increase activity slowly   Complete by: As directed       Allergies as of 02/06/2024       Reactions   Codeine    Heart racing      Penicillins    Anaphylaxis         Medication List     STOP taking these medications    PARoxetine 10 MG tablet Commonly known as: PAXIL       TAKE these medications      Indication  albuterol 108 (90 Base) MCG/ACT inhaler Commonly known as: VENTOLIN HFA Inhale 1-2 puffs into the lungs every 6 (six) hours as needed for wheezing or shortness of breath.  Indication: Asthma   atorvastatin 10 MG tablet Commonly  known as: LIPITOR Take 1 tablet (10 mg total) by mouth daily.  Indication: High Amount of Fats in the Blood   FLUoxetine 20 MG capsule Commonly known as: PROZAC Take 3 capsules (60 mg total) by mouth daily. What changed:  medication strength how much to take  Indication: Depression   gabapentin 100 MG capsule Commonly known as: NEURONTIN Take 1 capsule (100 mg total) by mouth 3 (three) times daily.  Indication: anxiety    losartan 100 MG tablet Commonly known as: COZAAR Take 1 tablet (100 mg total) by mouth daily after breakfast.  Indication: High Blood Pressure   metoprolol succinate 50 MG 24 hr tablet Commonly known as: TOPROL-XL Take 1 tablet (50 mg total) by mouth daily.  Indication: High Blood Pressure   risperiDONE 2 MG tablet Commonly known as: RISPERDAL Take 1 tablet (2 mg total) by mouth at bedtime.  Indication: Obsessive Compulsive Disorder   traZODone 100 MG tablet Commonly known as: DESYREL Take 1 tablet (100 mg total) by mouth at bedtime as needed for sleep.  Indication: Trouble Sleeping, Migraine Headache        Follow-up Information     [facility] Follow up.   Why: In person appointment is 02/11/24 at 11 AM. Contact information: [address] [place] [address] [phone]                 Follow-up recommendations:  Activity:  AS tolerated    Signed: Verner Chol, MD 02/06/2024, 10:31 AM

## 2024-02-06 NOTE — BHH Suicide Risk Assessment (Signed)
 [facility] Discharge Suicide Risk Assessment   Principal Problem: Mood disorder [facility]) Discharge Diagnoses: Principal Problem:   Mood disorder (HCC)   Total Time spent with patient: 30 minutes  Musculoskeletal: Strength & Muscle Tone: within normal limits Gait & Station: normal Patient leans: N/A  Psychiatric Specialty Exam  Presentation  General Appearance:  Appropriate for Environment; Casual  Eye Contact: Fair  Speech: Clear and Coherent  Speech Volume: Normal  Handedness: Right   Mood and Affect  Mood: Euthymic  Duration of Depression Symptoms: Greater than two weeks  Affect: Appropriate   Thought Process  Thought Processes: Coherent  Descriptions of Associations:Intact  Orientation:Full (Time, Place and Person)  Thought Content:Logical  History of Schizophrenia/Schizoaffective disorder:No  Duration of Psychotic Symptoms:Less than six months  Hallucinations:Hallucinations: None  Ideas of Reference:None  Suicidal Thoughts:Suicidal Thoughts: No  Homicidal Thoughts:Homicidal Thoughts: No   Sensorium  Memory: Immediate Fair; Recent Fair; Remote Fair  Judgment: Fair  Insight: Fair   Art therapist  Concentration: Fair  Attention Span: Fair  Recall: [employer] of Knowledge: Fair  Language: Fair   Psychomotor Activity  Psychomotor Activity: Psychomotor Activity: Normal   Assets  Assets: Communication Skills; Desire for Improvement; Physical Health   Sleep  Sleep: Sleep: Fair   Physical Exam: Physical Exam ROS Blood pressure 108/71, pulse 80, temperature 97.7 F (36.5 C), resp. rate 16, height 5\' 6"  (1.676 m), weight 90.7 kg, SpO2 96%. Body mass index is 32.28 kg/m.  Mental Status Per Nursing Assessment::   On Admission:  NA  Demographic Factors:  Male and Low socioeconomic status  Loss Factors: Decrease in vocational status  Historical Factors: Impulsivity  Risk Reduction Factors:   Sense of  responsibility to family, Living with another person, especially a relative, Positive social support, Positive therapeutic relationship, and Positive coping skills or problem solving skills  Continued Clinical Symptoms:  Previous Psychiatric Diagnoses and Treatments  Cognitive Features That Contribute To Risk:  None    Suicide Risk:  Minimal: No identifiable suicidal ideation.  Patients presenting with no risk factors but with morbid ruminations; may be classified as minimal risk based on the severity of the depressive symptoms   Follow-up Information     [facility] Follow up.   Why: In person appointment is 02/11/24 at 11 AM. Contact information: [address] [place] [address] [phone]                 Plan Of Care/Follow-up recommendations:  Activity:  As tolerated  Verner Chol, MD 02/06/2024, 11:59 AM

## 2024-02-06 NOTE — Plan of Care (Signed)
 D: Pt alert and oriented. Pt denies experiencing any anxiety/depression at this time. Pt denies experiencing any pain at this time. Pt denies experiencing any SI/HI, or AVH at this time.   A: Scheduled medications administered to pt, per MD orders. Support and encouragement provided. Frequent verbal contact made. Routine safety checks conducted q15 minutes.   R: No adverse drug reactions noted. Pt verbally contracts for safety at this time. Pt compliant with medications and treatment plan. Pt interacts well with others on the unit. Pt remains safe at this time. Plan of care ongoing.  Problem: Education: Goal: Emotional status will improve Outcome: Progressing Goal: Mental status will improve Outcome: Progressing

## 2024-02-06 NOTE — Group Note (Signed)
 Date:  02/06/2024 Time:  10:17 AM  Group Topic/Focus:  Goals Group:   The focus of this group is to help patients establish daily goals to achieve during treatment and discuss how the patient can incorporate goal setting into their daily lives to aide in recovery.    Participation Level:  Did Not Attend   Lynelle Smoke [facility] 02/06/2024, 10:17 AM

## 2024-02-06 NOTE — Progress Notes (Signed)
 D: Pt alert and oriented. Pt denies experiencing any pain, SI/HI, or AVH at this time. Pt reports he will be able to keep himself safe when he returns home. Pt has completed a suicide safety plan and was given a survey to fill out. Transition Record, AVS, and SRA were reviewed and given to the pt upon discharge. Pt declined wanting to have the floor manager contact him upon discharge.  A: Pt received discharge and medication education/information. Pt belongings were returned and signed for at this time.   R: Pt verbalized understanding of discharge and medication education/information.  Pt escorted by staff to the medical mall front lobby where the pt's family picked him up.

## 2024-02-06 NOTE — Progress Notes (Addendum)
  [facility] Adult Case Management Discharge Plan :  Will you be returning to the same living situation after discharge:  Yes,  Patient to return home.  At discharge, do you have transportation home?: Yes,  Patient to be transported by family.  Do you have the ability to pay for your medications: Yes, [employer]   Release of information consent forms completed and in the chart;  Patient's signature needed at discharge.  Patient to Follow up at:  Follow-up Information     [facility] Follow up.   Why: In person appointment is 02/11/24 at 11 AM. Contact information: [address] [place] [address] [phone]                Next level of care provider has access to [facility] Link:no  Safety Planning and Suicide Prevention discussed: Yes, Education Completed; sister Esau Grew [phone] ,  has been identified by the patient as the family member/significant other with whom the patient will be residing, and identified as the person(s) who will aid the patient in the event of a mental health crisis (suicidal ideations/suicide attempt).  With written consent from the patient, the family member/significant other has been provided the following suicide prevention education, prior to the and/or following the discharge of the patient.   Has patient been referred to the Quitline?: Patient refused referral for treatment  Patient has been referred for addiction treatment: Yes, the patient will follow up with an outpatient provider for substance use disorder. Psychiatrist/APP: appointment made and Therapist: appointment made  Lowry Ram, LCSW 02/06/2024, 9:01 AM

## 2024-02-06 NOTE — Group Note (Signed)
 Recreation Therapy Group Note   Group Topic:Leisure Education  Group Date: 02/06/2024 Start Time: 1000 End Time: 1100 Facilitators: Rosina Lowenstein, LRT, CTRS Location:  Craft Room  Group Description: Leisure. Patients were given the option to choose from singing karaoke, coloring mandalas, using oil pastels, journaling, or playing with play-doh. LRT and pts discussed the meaning of leisure, the importance of participating in leisure during their free time/when they're outside of the hospital, as well as how our leisure interests can also serve as coping skills.  Goal Area(s) Addressed:  Patient will identify a current leisure interest.  Patient will learn the definition of "leisure". Patient will practice making a positive decision. Patient will have the opportunity to try a new leisure activity. Patient will communicate with peers and LRT.    Affect/Mood: N/A   Participation Level: Did not attend    Clinical Observations/Individualized Feedback: Patient did not attend group.   Plan: Continue to engage patient in RT group sessions 2-3x/week.   Rosina Lowenstein, LRT, CTRS 02/06/2024 1:29 PM

## 2024-03-18 ENCOUNTER — Telehealth (HOSPITAL_COMMUNITY): Payer: Self-pay | Admitting: Professional

## 2024-10-12 ENCOUNTER — Other Ambulatory Visit: Payer: Self-pay | Admitting: Student

## 2024-10-12 DIAGNOSIS — I10 Essential (primary) hypertension: Secondary | ICD-10-CM

## 2024-10-12 NOTE — Telephone Encounter (Signed)
 NOT [facility] PATIENT
